# Patient Record
Sex: Male | Born: 1955 | Race: White | Hispanic: No | Marital: Married | Smoking: Never smoker
Health system: Southern US, Community
[De-identification: ages and names within clinical notes are randomized; demographics above are authoritative.]

## PROBLEM LIST (undated history)

## (undated) DIAGNOSIS — Z9884 Bariatric surgery status: Secondary | ICD-10-CM

## (undated) DIAGNOSIS — K219 Gastro-esophageal reflux disease without esophagitis: Secondary | ICD-10-CM

## (undated) DIAGNOSIS — C801 Malignant (primary) neoplasm, unspecified: Secondary | ICD-10-CM

## (undated) DIAGNOSIS — G9389 Other specified disorders of brain: Secondary | ICD-10-CM

## (undated) DIAGNOSIS — E119 Type 2 diabetes mellitus without complications: Secondary | ICD-10-CM

## (undated) DIAGNOSIS — R569 Unspecified convulsions: Secondary | ICD-10-CM

## (undated) HISTORY — DX: Malignant (primary) neoplasm, unspecified: C80.1

## (undated) HISTORY — PX: APPENDECTOMY: SHX54

## (undated) HISTORY — PX: CHOLECYSTECTOMY: SHX55

## (undated) HISTORY — PX: OTHER SURGICAL HISTORY: SHX169

---

## 2009-12-23 HISTORY — PX: GASTRIC BYPASS: SHX52

## 2014-12-23 HISTORY — PX: IVC FILTER PLACEMENT (ARMC HX): HXRAD1551

## 2015-03-23 ENCOUNTER — Emergency Department (HOSPITAL_COMMUNITY): Payer: Commercial Managed Care - PPO

## 2015-03-23 ENCOUNTER — Encounter (HOSPITAL_COMMUNITY): Payer: Self-pay | Admitting: Neurology

## 2015-03-23 ENCOUNTER — Encounter (HOSPITAL_COMMUNITY)
Admission: EM | Disposition: A | Discharge status: Home / Self Care | Payer: Self-pay | Source: Home / Self Care | Attending: Internal Medicine

## 2015-03-23 ENCOUNTER — Inpatient Hospital Stay (HOSPITAL_COMMUNITY)
Admission: EM | Admit: 2015-03-23 | Discharge: 2015-03-28 | DRG: 054 | Disposition: A | Discharge status: Home / Self Care | Payer: Commercial Managed Care - PPO | Attending: Internal Medicine | Admitting: Internal Medicine

## 2015-03-23 DIAGNOSIS — R160 Hepatomegaly, not elsewhere classified: Secondary | ICD-10-CM | POA: Insufficient documentation

## 2015-03-23 DIAGNOSIS — R6881 Early satiety: Secondary | ICD-10-CM | POA: Diagnosis present

## 2015-03-23 DIAGNOSIS — C787 Secondary malignant neoplasm of liver and intrahepatic bile duct: Secondary | ICD-10-CM | POA: Diagnosis present

## 2015-03-23 DIAGNOSIS — Z6833 Body mass index (BMI) 33.0-33.9, adult: Secondary | ICD-10-CM | POA: Diagnosis not present

## 2015-03-23 DIAGNOSIS — M25462 Effusion, left knee: Secondary | ICD-10-CM | POA: Diagnosis present

## 2015-03-23 DIAGNOSIS — I618 Other nontraumatic intracerebral hemorrhage: Secondary | ICD-10-CM | POA: Diagnosis present

## 2015-03-23 DIAGNOSIS — T380X5A Adverse effect of glucocorticoids and synthetic analogues, initial encounter: Secondary | ICD-10-CM | POA: Diagnosis present

## 2015-03-23 DIAGNOSIS — F341 Dysthymic disorder: Secondary | ICD-10-CM | POA: Diagnosis present

## 2015-03-23 DIAGNOSIS — Z9884 Bariatric surgery status: Secondary | ICD-10-CM

## 2015-03-23 DIAGNOSIS — C801 Malignant (primary) neoplasm, unspecified: Secondary | ICD-10-CM | POA: Diagnosis present

## 2015-03-23 DIAGNOSIS — G4733 Obstructive sleep apnea (adult) (pediatric): Secondary | ICD-10-CM | POA: Diagnosis present

## 2015-03-23 DIAGNOSIS — Z7982 Long term (current) use of aspirin: Secondary | ICD-10-CM

## 2015-03-23 DIAGNOSIS — E109 Type 1 diabetes mellitus without complications: Secondary | ICD-10-CM | POA: Diagnosis not present

## 2015-03-23 DIAGNOSIS — E669 Obesity, unspecified: Secondary | ICD-10-CM | POA: Diagnosis present

## 2015-03-23 DIAGNOSIS — G9389 Other specified disorders of brain: Secondary | ICD-10-CM

## 2015-03-23 DIAGNOSIS — G936 Cerebral edema: Secondary | ICD-10-CM | POA: Diagnosis present

## 2015-03-23 DIAGNOSIS — I469 Cardiac arrest, cause unspecified: Secondary | ICD-10-CM | POA: Insufficient documentation

## 2015-03-23 DIAGNOSIS — R55 Syncope and collapse: Secondary | ICD-10-CM | POA: Diagnosis present

## 2015-03-23 DIAGNOSIS — E1165 Type 2 diabetes mellitus with hyperglycemia: Secondary | ICD-10-CM | POA: Diagnosis present

## 2015-03-23 DIAGNOSIS — K769 Liver disease, unspecified: Secondary | ICD-10-CM

## 2015-03-23 DIAGNOSIS — E1169 Type 2 diabetes mellitus with other specified complication: Secondary | ICD-10-CM

## 2015-03-23 DIAGNOSIS — R569 Unspecified convulsions: Secondary | ICD-10-CM

## 2015-03-23 DIAGNOSIS — E119 Type 2 diabetes mellitus without complications: Secondary | ICD-10-CM

## 2015-03-23 DIAGNOSIS — S2243XA Multiple fractures of ribs, bilateral, initial encounter for closed fracture: Secondary | ICD-10-CM | POA: Diagnosis present

## 2015-03-23 DIAGNOSIS — G939 Disorder of brain, unspecified: Secondary | ICD-10-CM | POA: Diagnosis not present

## 2015-03-23 DIAGNOSIS — C7931 Secondary malignant neoplasm of brain: Principal | ICD-10-CM | POA: Diagnosis present

## 2015-03-23 DIAGNOSIS — J690 Pneumonitis due to inhalation of food and vomit: Secondary | ICD-10-CM | POA: Diagnosis present

## 2015-03-23 DIAGNOSIS — I10 Essential (primary) hypertension: Secondary | ICD-10-CM | POA: Diagnosis present

## 2015-03-23 DIAGNOSIS — I5032 Chronic diastolic (congestive) heart failure: Secondary | ICD-10-CM | POA: Diagnosis present

## 2015-03-23 DIAGNOSIS — M25569 Pain in unspecified knee: Secondary | ICD-10-CM

## 2015-03-23 DIAGNOSIS — C7802 Secondary malignant neoplasm of left lung: Secondary | ICD-10-CM | POA: Diagnosis present

## 2015-03-23 DIAGNOSIS — Z79899 Other long term (current) drug therapy: Secondary | ICD-10-CM | POA: Diagnosis not present

## 2015-03-23 DIAGNOSIS — S2249XA Multiple fractures of ribs, unspecified side, initial encounter for closed fracture: Secondary | ICD-10-CM | POA: Diagnosis present

## 2015-03-23 HISTORY — DX: Bariatric surgery status: Z98.84

## 2015-03-23 HISTORY — DX: Type 2 diabetes mellitus without complications: E11.9

## 2015-03-23 HISTORY — DX: Unspecified convulsions: R56.9

## 2015-03-23 HISTORY — PX: LEFT HEART CATHETERIZATION WITH CORONARY ANGIOGRAM: SHX5451

## 2015-03-23 HISTORY — DX: Gastro-esophageal reflux disease without esophagitis: K21.9

## 2015-03-23 LAB — I-STAT TROPONIN, ED: TROPONIN I, POC: 0.01 ng/mL (ref 0.00–0.08)

## 2015-03-23 LAB — I-STAT CHEM 8, ED
BUN: 20 mg/dL (ref 6–23)
CALCIUM ION: 1.08 mmol/L — AB (ref 1.13–1.30)
CHLORIDE: 102 mmol/L (ref 96–112)
Creatinine, Ser: 1.3 mg/dL (ref 0.50–1.35)
GLUCOSE: 183 mg/dL — AB (ref 70–99)
HEMATOCRIT: 51 % (ref 39.0–52.0)
HEMOGLOBIN: 17.3 g/dL — AB (ref 13.0–17.0)
Potassium: 4.6 mmol/L (ref 3.5–5.1)
SODIUM: 138 mmol/L (ref 135–145)
TCO2: 19 mmol/L (ref 0–100)

## 2015-03-23 LAB — BASIC METABOLIC PANEL
Anion gap: 18 — ABNORMAL HIGH (ref 5–15)
BUN: 14 mg/dL (ref 6–23)
CO2: 20 mmol/L (ref 19–32)
CREATININE: 1.27 mg/dL (ref 0.50–1.35)
Calcium: 9.1 mg/dL (ref 8.4–10.5)
Chloride: 101 mmol/L (ref 96–112)
GFR calc Af Amer: 69 mL/min — ABNORMAL LOW (ref >90)
GFR calc non Af Amer: 60 mL/min — ABNORMAL LOW (ref >90)
GLUCOSE: 181 mg/dL — AB (ref 70–99)
Potassium: 4.7 mmol/L (ref 3.5–5.1)
SODIUM: 139 mmol/L (ref 135–145)

## 2015-03-23 LAB — CBC
HCT: 46.2 % (ref 39.0–52.0)
HCT: 36 % — ABNORMAL LOW (ref 39.0–52.0)
HEMOGLOBIN: 12 g/dL — AB (ref 13.0–17.0)
Hemoglobin: 15.5 g/dL (ref 13.0–17.0)
MCH: 30.3 pg (ref 26.0–34.0)
MCH: 29.7 pg (ref 26.0–34.0)
MCHC: 33.5 g/dL (ref 30.0–36.0)
MCHC: 33.3 g/dL (ref 30.0–36.0)
MCV: 90.4 fL (ref 78.0–100.0)
MCV: 89.1 fL (ref 78.0–100.0)
PLATELETS: 221 10*3/uL (ref 150–400)
Platelets: 187 10*3/uL (ref 150–400)
RBC: 5.11 MIL/uL (ref 4.22–5.81)
RBC: 4.04 MIL/uL — AB (ref 4.22–5.81)
RDW: 13.4 % (ref 11.5–15.5)
RDW: 13.3 % (ref 11.5–15.5)
WBC: 10.1 10*3/uL (ref 4.0–10.5)
WBC: 8.5 10*3/uL (ref 4.0–10.5)

## 2015-03-23 LAB — TSH: TSH: 3.505 u[IU]/mL (ref 0.350–4.500)

## 2015-03-23 LAB — CBG MONITORING, ED: GLUCOSE-CAPILLARY: 153 mg/dL — AB (ref 70–99)

## 2015-03-23 LAB — GLUCOSE, CAPILLARY
Glucose-Capillary: 155 mg/dL — ABNORMAL HIGH (ref 70–99)
Glucose-Capillary: 194 mg/dL — ABNORMAL HIGH (ref 70–99)

## 2015-03-23 LAB — CREATININE, SERUM
CREATININE: 0.91 mg/dL (ref 0.50–1.35)
GFR calc Af Amer: >90 mL/min (ref >90)
GFR calc non Af Amer: >90 mL/min (ref >90)

## 2015-03-23 LAB — PROTIME-INR
INR: 1.16 (ref 0.00–1.49)
Prothrombin Time: 14.9 seconds (ref 11.6–15.2)

## 2015-03-23 LAB — MRSA PCR SCREENING: MRSA by PCR: NEGATIVE

## 2015-03-23 LAB — D-DIMER, QUANTITATIVE (NOT AT ARMC): D DIMER QUANT: 3.99 ug{FEU}/mL — AB (ref 0.00–0.48)

## 2015-03-23 LAB — BRAIN NATRIURETIC PEPTIDE: B Natriuretic Peptide: 64.5 pg/mL (ref 0.0–100.0)

## 2015-03-23 LAB — MAGNESIUM: Magnesium: 1.7 mg/dL (ref 1.5–2.5)

## 2015-03-23 SURGERY — LEFT HEART CATHETERIZATION WITH CORONARY ANGIOGRAM
Anesthesia: LOCAL

## 2015-03-23 MED ORDER — ONDANSETRON HCL 4 MG/2ML IJ SOLN: 4.0000 mg | Freq: Four times a day (QID) | INTRAMUSCULAR | Status: DC | PRN

## 2015-03-23 MED ORDER — ASPIRIN 300 MG RE SUPP: 300.0000 mg | RECTAL | Status: AC

## 2015-03-23 MED ORDER — LIDOCAINE HCL (PF) 1 % IJ SOLN
INTRAMUSCULAR | Status: AC
  Filled 2015-03-23: qty 30

## 2015-03-23 MED ORDER — SODIUM CHLORIDE 0.9 % IJ SOLN: 3.0000 mL | INTRAMUSCULAR | Status: DC | PRN

## 2015-03-23 MED ORDER — HEPARIN SODIUM (PORCINE) 1000 UNIT/ML IJ SOLN
INTRAMUSCULAR | Status: AC
  Filled 2015-03-23: qty 1

## 2015-03-23 MED ORDER — OXYCODONE-ACETAMINOPHEN 5-325 MG PO TABS
1.0000 | ORAL_TABLET | ORAL | Status: DC | PRN
  Administered 2015-03-23 (×2): 2 via ORAL
  Administered 2015-03-24 – 2015-03-26 (×7): 1 via ORAL
  Administered 2015-03-27 – 2015-03-28 (×3): 2 via ORAL
  Filled 2015-03-23: qty 2
  Filled 2015-03-23: qty 1
  Filled 2015-03-23 (×4): qty 2
  Filled 2015-03-23 (×2): qty 1
  Filled 2015-03-23: qty 2
  Filled 2015-03-23 (×3): qty 1

## 2015-03-23 MED ORDER — AMIODARONE IV BOLUS ONLY 150 MG/100ML
150.0000 mg | Freq: Once | INTRAVENOUS | Status: AC
  Administered 2015-03-23: 150 mg via INTRAVENOUS

## 2015-03-23 MED ORDER — SODIUM CHLORIDE 0.9 % IV SOLN: INTRAVENOUS | Status: AC

## 2015-03-23 MED ORDER — AMIODARONE HCL IN DEXTROSE 360-4.14 MG/200ML-% IV SOLN
30.0000 mg/h | INTRAVENOUS | Status: DC
  Administered 2015-03-24: 30 mg/h via INTRAVENOUS
  Filled 2015-03-23 (×4): qty 200

## 2015-03-23 MED ORDER — ASPIRIN EC 81 MG PO TBEC
81.0000 mg | DELAYED_RELEASE_TABLET | Freq: Every day | ORAL | Status: DC
  Administered 2015-03-24: 81 mg via ORAL
  Filled 2015-03-23: qty 1

## 2015-03-23 MED ORDER — NITROGLYCERIN 0.4 MG SL SUBL: 0.4000 mg | SUBLINGUAL_TABLET | SUBLINGUAL | Status: DC | PRN

## 2015-03-23 MED ORDER — ACETAMINOPHEN 325 MG PO TABS: 650.0000 mg | ORAL_TABLET | ORAL | Status: DC | PRN

## 2015-03-23 MED ORDER — FENTANYL CITRATE 0.05 MG/ML IJ SOLN
INTRAMUSCULAR | Status: AC
  Filled 2015-03-23: qty 2

## 2015-03-23 MED ORDER — MORPHINE SULFATE 2 MG/ML IJ SOLN: 2.0000 mg | INTRAMUSCULAR | Status: DC | PRN

## 2015-03-23 MED ORDER — SODIUM CHLORIDE 0.9 % IV SOLN: 250.0000 mL | INTRAVENOUS | Status: DC | PRN

## 2015-03-23 MED ORDER — ASPIRIN 81 MG PO CHEW: 324.0000 mg | CHEWABLE_TABLET | ORAL | Status: AC

## 2015-03-23 MED ORDER — VERAPAMIL HCL 2.5 MG/ML IV SOLN
INTRAVENOUS | Status: AC
  Filled 2015-03-23: qty 2

## 2015-03-23 MED ORDER — MIDAZOLAM HCL 2 MG/2ML IJ SOLN
INTRAMUSCULAR | Status: AC
  Filled 2015-03-23: qty 2

## 2015-03-23 MED ORDER — AMIODARONE HCL IN DEXTROSE 360-4.14 MG/200ML-% IV SOLN
60.0000 mg/h | Freq: Once | INTRAVENOUS | Status: AC
  Administered 2015-03-23: 60 mg/h via INTRAVENOUS
  Filled 2015-03-23: qty 200

## 2015-03-23 MED ORDER — INSULIN ASPART 100 UNIT/ML ~~LOC~~ SOLN
0.0000 [IU] | Freq: Three times a day (TID) | SUBCUTANEOUS | Status: DC
  Administered 2015-03-24: 1 [IU] via SUBCUTANEOUS
  Administered 2015-03-24: 2 [IU] via SUBCUTANEOUS
  Administered 2015-03-25 (×2): 1 [IU] via SUBCUTANEOUS
  Administered 2015-03-25: 3 [IU] via SUBCUTANEOUS

## 2015-03-23 MED ORDER — AMIODARONE HCL IN DEXTROSE 360-4.14 MG/200ML-% IV SOLN
60.0000 mg/h | INTRAVENOUS | Status: AC
  Administered 2015-03-23: 60 mg/h via INTRAVENOUS
  Filled 2015-03-23: qty 200

## 2015-03-23 MED ORDER — SODIUM CHLORIDE 0.9 % IJ SOLN
3.0000 mL | Freq: Two times a day (BID) | INTRAMUSCULAR | Status: DC
  Administered 2015-03-23 – 2015-03-25 (×4): 3 mL via INTRAVENOUS

## 2015-03-23 MED ORDER — HEPARIN (PORCINE) IN NACL 2-0.9 UNIT/ML-% IJ SOLN
INTRAMUSCULAR | Status: AC
  Filled 2015-03-23: qty 1000

## 2015-03-23 NOTE — CV Procedure (Addendum)
      Cardiac Catheterization Operative Report  Walter Wilkins [id number] 3/31/20163:47 PM No primary care provider on file.  Procedure Performed:  1. Left Heart Catheterization 2. Selective Coronary Angiography 3. Left ventricular angiogram  Operator: Lauree Chandler, MD  Arterial access site:  Right radial artery.   Indication:  59 yo male with history of DM brought to the ED by EMS after cardiac arrest at the [facility] game. He stood up to walk to the bathroom and passed out. Bystander CPR and 2 shocks delivered by the AED but rhythm unknown. Pt was quickly responsive and was not intubated. No chest pain or SOB. EKG without ischemic changes. Given cardiac arrest we elected to perform cardiac cath to exclude obstructive CAD.                                      Procedure Details: The risks, benefits, complications, treatment options, and expected outcomes were discussed with the patient. The patient and/or family concurred with the proposed plan. Emergency consent placed on the chart. The patient was brought to the cath lab from the ED. The patient was further sedated with Versed and Fentanyl. The right wrist was assessed with a modified Allens test which was positive. The right wrist was prepped and draped in a sterile fashion. 1% lidocaine was used for local anesthesia. Using the modified Seldinger access technique, a 5 French sheath was placed in the right radial artery. 3 mg Verapamil was given through the sheath. 5000 units IV heparin was given. Standard diagnostic catheters were used to perform selective coronary angiography. A pigtail catheter was used to perform a left ventricular angiogram. The sheath was removed from the right radial artery and a Terumo hemostasis band was applied at the arteriotomy site on the right wrist.   There were no immediate complications. The patient was taken to the recovery area in stable condition.   Hemodynamic Findings: Central aortic  pressure: 105/69 Left ventricular pressure: 111/4/6  Angiographic Findings:  Left main: No obstructive disease.   Left Anterior Descending Artery: Large caliber vessel that courses to the apex. Large caliber first diagonal branch and moderate caliber second diagonal branch. No obstructive disease.   Circumflex Artery: Large caliber vessel with small first obtuse marginal branch and moderate caliber second obtuse marginal branch. No obstructive disease.   Right Coronary Artery: Very large caliber, dominant vessel with no obstructive disease.   Left Ventricular Angiogram: LVEF=55-60%.   Impression: 1. No angiographic evidence of CAD 2. Out of hospital cardiac arrest with normal mentation, normal blood pressure 3. Normal LV systolic function  Recommendations: Will admit to CCU. Echo later today. Follow closely on telemetry. Cycle cardiac markers. Will attempt to get print out of strips from the AED. Our STEMI coordinator is working on this. Will ask EP to see in the am. May need Lifevest at time of discharge as this appears to be a primary arrhythmic event. Continue amiodarone drip for now.        Complications:  None. The patient tolerated the procedure well.

## 2015-03-23 NOTE — ED Notes (Signed)
Per EMS- Pt was at [employer], got up to go to the bathroom and collapsed. Was assisted to the floor. Bystanders started CPR for 3 mins, AED shocked x 1. When EMS arrived OPA in place, was being bagged with strong pulses. While in route pt started verbally communicating with EMS. Pt does not rememeber what happened, denies feeling bad prior.

## 2015-03-23 NOTE — Progress Notes (Signed)
Patient arrived to floor approximately 1600 via bed from cath lab, report given at bedside, TR band in place to right radial with 12 cc air, began deflating at 1615 based on time from cath lab.  He has been alert and oriented since arriving, appears to be tired, has no recollection of what happened immediately prior to going "down".  Wife and/or brother have been at bedside most of the afternoon.  He complains of sternal chest pain due to compressions, medicating with PO pain medication once available. He was able to eat saltines and ginger ale before advancing diet without difficulty or nausea.  Family updated on SDU policy and procedure, will continue to monitor

## 2015-03-23 NOTE — ED Notes (Signed)
Cath lab ready for patient. Family at bedside, belongings given to wife.

## 2015-03-23 NOTE — ED Notes (Signed)
Cardiology at bedside, plan for pt cath.

## 2015-03-23 NOTE — H&P (Signed)
Patient ID: Walter Wilkins MRN: [id number], DOB/AGE: 02/19/1956   Admit date: 03/23/2015   Primary Physician: No primary care provider on file. Primary Cardiologist: New  Pt. Profile:  Walter Wilkins is a 59 y.o. male with a history of DM, gastric surgery and no prior cardiac history who presented to [facility] via EMS after a syncopal event at a baseball game. History is limited but CPR was provided and a civilian AED was used and he was shocked twice. Cardiology was called for urgent evaluation.  He was in his usual state of health at a baseball game with his grandchildren. He got up to walk to the bathroom when he suddenly passed out. The next thing he remembers is waking up at [facility]. He is now alert and oriented. He does not recall any palpitations, chest pain or SOB. He currently has some tenderness in his chest wall where CPR was provided and defibrillation pads were placed. He did bite his tonge and became incontinent of urine during this event. The AED had no monitor and we have no recordings of strips. They are presumed vfib/vtach as a shockable rhythm but we cannot be certain. In the ED ECG with sinus tachycardia and no evidence of ischemic changes. Stat BMET with creat 1.3.  He has no past cardiac history, HTN, HLD, CKD, previous CVA.    Problem List  Past Medical History  Diagnosis Date  . Diabetes mellitus without complication     History reviewed. No pertinent past surgical history.   Allergies  No Known Allergies   Home Medications  Prior to Admission medications   Not on File    Family History  No family history on file. No family status information on file.     Social History  History   Social History  . Marital Status: Unknown    Spouse Name: N/A  . Number of Children: N/A  . Years of Education: N/A   Occupational History  . Not on file.   Social History Main Topics  . Smoking status: Not on file  . Smokeless tobacco: Not on file  . Alcohol Use: Not  on file  . Drug Use: Not on file  . Sexual Activity: Not on file   Other Topics Concern  . Not on file   Social History Narrative  . No narrative on file     All other systems reviewed and are otherwise negative except as noted above.  Physical Exam  Blood pressure 142/104, pulse 103, temperature 99.4 F (37.4 C), temperature source Oral, resp. rate 14, SpO2 100 %.  General: Pleasant, NAD. Blood on face from tongue bite Psych: Normal affect. Neuro: Alert and oriented X 3. Moves all extremities spontaneously. HEENT: Normal  Neck: Supple without bruits or JVD. Lungs:  Resp regular and unlabored, CTA. Heart: RRR no s3, s4, or murmurs. Tenderness in chest wall.  Abdomen: Soft, non-tender, non-distended, BS + x 4.  Extremities: No clubbing, cyanosis or edema. DP/PT/Radials 2+ and equal bilaterally.  Labs   Radiology/Studies  No results found.  ECG  Sinus tachycardia HR 114  ASSESSMENT AND PLAN   Walter Wilkins is a 59 y.o. male with a history of DM, gastric surgery and no prior cardiac history who presented to [facility] via EMS after a syncopal event at a baseball game. History is limited but CPR was provided and a civilian AED was used and he was shocked twice. Cardiology was called for urgent evaluation.  Syncopal episode- presumed cardiac arrest  although we have no objective evidence of this. Continue IV amio. Will take to cardiac cath lab for urgent cardiac catheterixation to exclude ischemia  -- Cycle troponin x3, serial ECGs -- Will admit to step down -- 2D ECHO    Signed, Eileen Stanford, PA-C 03/23/2015, 2:44 PM  Pager [phone]  I have personally seen and examined this patient with Angelena Form, PA-C. I agree with the assessment and plan as outlined above. He is a known diabetic but has no known CAD. He was at the [employer] today and when he got up to go to the bathroom he passed out. No chest pain or SOB. He was feeling well. No palpitations. He received  bystander CPR and AED delivered 2 shocks for unknown rhythm. He woke up and was transported to [facility] ED for further evaluation. EKG does not show injury current. He has no chest pain or SOB. I have discussed the possible etiology of cardiac arrest which could be primary arrhythmia vs acute coronary syndrome. Will plan cardiac cath to exclude obstructive CAD. Will plan echo later today. Admit to CCU following cath.   Walter Wilkins 03/23/2015 3:11 PM

## 2015-03-23 NOTE — ED Provider Notes (Addendum)
CSN: [id number]     Arrival date & time 03/23/15  1420 History   First MD Initiated Contact with Patient 03/23/15 1419     Chief Complaint  Patient presents with  . Loss of Consciousness     (Consider location/radiation/quality/duration/timing/severity/associated sxs/prior Treatment) Patient is a 59 y.o. male presenting with syncope. The history is provided by the patient.  Loss of Consciousness Episode history:  Single Most recent episode:  Today Duration:  5 minutes Timing:  Constant Progression:  Resolved Chronicity:  New Witnessed: yes   Relieved by: [place] ballpark. Ineffective treatments:  None tried Associated symptoms: no chest pain, no fever, no nausea, no shortness of breath and no vomiting     Past Medical History  Diagnosis Date  . Diabetes mellitus without complication    History reviewed. No pertinent past surgical history. No family history on file. History  Substance Use Topics  . Smoking status: Not on file  . Smokeless tobacco: Not on file  . Alcohol Use: Not on file    Review of Systems  Constitutional: Negative for fever and chills.  Respiratory: Negative for cough and shortness of breath.   Cardiovascular: Positive for syncope. Negative for chest pain and leg swelling.  Gastrointestinal: Negative for nausea, vomiting and abdominal pain.  All other systems reviewed and are negative.     Allergies  Review of patient's allergies indicates no known allergies.  Home Medications   Prior to Admission medications   Not on File   BP 142/104 mmHg  Pulse 103  Temp(Src) 99.4 F (37.4 C) (Oral)  Resp 14  SpO2 100% Physical Exam  Constitutional: He is oriented to person, place, and time. He appears well-developed and well-nourished. No distress.  HENT:  Head: Normocephalic and atraumatic.  Mouth/Throat: Oropharynx is clear and moist. No oropharyngeal exudate.  Eyes: EOM are normal. Pupils are equal, round, and reactive to light.  Neck:  Normal range of motion. Neck supple.  Cardiovascular: Normal rate and regular rhythm.  Exam reveals no friction rub.   No murmur heard. Pulmonary/Chest: Effort normal and breath sounds normal. No respiratory distress. He has no wheezes. He has no rales.  Abdominal: Soft. He exhibits no distension. There is no tenderness. There is no rebound.  Musculoskeletal: Normal range of motion. He exhibits no edema.  Neurological: He is alert and oriented to person, place, and time. No cranial nerve deficit. He exhibits normal muscle tone. Coordination normal.  Skin: Skin is warm. No rash noted. He is not diaphoretic.  Nursing note and vitals reviewed.   ED Course  Procedures (including critical care time) Labs Review Labs Reviewed  BASIC METABOLIC PANEL - Abnormal; Notable for the following:    Glucose, Bld 181 (*)    GFR calc non Af Amer 60 (*)    GFR calc Af Amer 69 (*)    Anion gap 18 (*)    All other components within normal limits  I-STAT CHEM 8, ED - Abnormal; Notable for the following:    Glucose, Bld 183 (*)    Calcium, Ion 1.08 (*)    Hemoglobin 17.3 (*)    All other components within normal limits  CBG MONITORING, ED - Abnormal; Notable for the following:    Glucose-Capillary 153 (*)    All other components within normal limits  MRSA PCR SCREENING  CBC  I-STAT TROPOININ, ED    Imaging Review Dg Chest Portable 1 View  03/23/2015   CLINICAL DATA:  Syncope at baseball  game with CPR.  EXAM: PORTABLE CHEST - 1 VIEW  COMPARISON:  None.  FINDINGS: There is airspace opacity throughout the left lung. Heart size and upper mediastinal contours are within normal limits for technique. No evidence of effusion or air leak. No evidence of rib fracture post CPR.  IMPRESSION: Airspace disease throughout the left lung, likely aspiration given the history. CPR related lung contusion considered unlikely given no visible rib fracture.   Electronically Signed   By: Monte Fantasia M.D.   On: 03/23/2015  15:03     EKG Interpretation None      <ECG> EKG: sinus tachycardia, rate 118. No prior for comparison. No ischemic changes.  CRITICAL CARE Performed by: Osvaldo Shipper   Total critical care time: 30 minutes  Critical care time was exclusive of separately billable procedures and treating other patients.  Critical care was necessary to treat or prevent imminent or life-threatening deterioration.  Critical care was time spent personally by me on the following activities: development of treatment plan with patient and/or surrogate as well as nursing, discussions with consultants, evaluation of patient's response to treatment, examination of patient, obtaining history from patient or surrogate, ordering and performing treatments and interventions, ordering and review of laboratory studies, ordering and review of radiographic studies, pulse oximetry and re-evaluation of patient's condition.  MDM   Final diagnoses:  Cardiac arrest    76M here post-CPR. Collapsed at a [employer] baseball game. He received 3 minutes of bystander CPR and was shocked with an AED at the ballpark.  GCS 15 on arrival, never was intubated.  He is amnestic to entire event. Cards consulted immediately. Amiodarone gtt initiated. Dr. Angelena Form with Cards will take to the cath lab. I spoke with the ballpark, who will download info from the AED and send it to the cath lab. The AED doesn't have a print out or a screen to show history. It did say, "shock advised."  Evelina Bucy, MD 03/23/15 1619  Evelina Bucy, MD 03/23/15 [phone]

## 2015-03-24 ENCOUNTER — Inpatient Hospital Stay (HOSPITAL_COMMUNITY): Payer: Commercial Managed Care - PPO

## 2015-03-24 ENCOUNTER — Encounter (HOSPITAL_COMMUNITY): Payer: Self-pay | Admitting: Physician Assistant

## 2015-03-24 DIAGNOSIS — Z9884 Bariatric surgery status: Secondary | ICD-10-CM

## 2015-03-24 DIAGNOSIS — G939 Disorder of brain, unspecified: Secondary | ICD-10-CM

## 2015-03-24 DIAGNOSIS — E1169 Type 2 diabetes mellitus with other specified complication: Secondary | ICD-10-CM

## 2015-03-24 DIAGNOSIS — R569 Unspecified convulsions: Secondary | ICD-10-CM | POA: Insufficient documentation

## 2015-03-24 DIAGNOSIS — S2249XA Multiple fractures of ribs, unspecified side, initial encounter for closed fracture: Secondary | ICD-10-CM | POA: Diagnosis present

## 2015-03-24 DIAGNOSIS — K7689 Other specified diseases of liver: Secondary | ICD-10-CM

## 2015-03-24 DIAGNOSIS — I5032 Chronic diastolic (congestive) heart failure: Secondary | ICD-10-CM | POA: Diagnosis present

## 2015-03-24 DIAGNOSIS — J690 Pneumonitis due to inhalation of food and vomit: Secondary | ICD-10-CM | POA: Diagnosis present

## 2015-03-24 DIAGNOSIS — E119 Type 2 diabetes mellitus without complications: Secondary | ICD-10-CM

## 2015-03-24 DIAGNOSIS — I10 Essential (primary) hypertension: Secondary | ICD-10-CM | POA: Diagnosis present

## 2015-03-24 DIAGNOSIS — E669 Obesity, unspecified: Secondary | ICD-10-CM

## 2015-03-24 DIAGNOSIS — R55 Syncope and collapse: Secondary | ICD-10-CM

## 2015-03-24 DIAGNOSIS — I469 Cardiac arrest, cause unspecified: Secondary | ICD-10-CM

## 2015-03-24 DIAGNOSIS — G9389 Other specified disorders of brain: Secondary | ICD-10-CM

## 2015-03-24 HISTORY — DX: Other specified disorders of brain: G93.89

## 2015-03-24 LAB — CBC
HCT: 42 % (ref 39.0–52.0)
Hemoglobin: 13.9 g/dL (ref 13.0–17.0)
MCH: 29.6 pg (ref 26.0–34.0)
MCHC: 33.1 g/dL (ref 30.0–36.0)
MCV: 89.4 fL (ref 78.0–100.0)
Platelets: 190 10*3/uL (ref 150–400)
RBC: 4.7 MIL/uL (ref 4.22–5.81)
RDW: 13.3 % (ref 11.5–15.5)
WBC: 9 10*3/uL (ref 4.0–10.5)

## 2015-03-24 LAB — LIPID PANEL
CHOL/HDL RATIO: 3.2 ratio
Cholesterol: 136 mg/dL (ref 0–200)
HDL: 42 mg/dL (ref >39)
LDL CALC: 68 mg/dL (ref 0–99)
TRIGLYCERIDES: 129 mg/dL (ref <150)
VLDL: 26 mg/dL (ref 0–40)

## 2015-03-24 LAB — GLUCOSE, CAPILLARY
GLUCOSE-CAPILLARY: 155 mg/dL — AB (ref 70–99)
GLUCOSE-CAPILLARY: 251 mg/dL — AB (ref 70–99)
Glucose-Capillary: 128 mg/dL — ABNORMAL HIGH (ref 70–99)
Glucose-Capillary: 146 mg/dL — ABNORMAL HIGH (ref 70–99)

## 2015-03-24 LAB — COMPREHENSIVE METABOLIC PANEL
ALT: 22 U/L (ref 0–53)
AST: 28 U/L (ref 0–37)
Albumin: 3.1 g/dL — ABNORMAL LOW (ref 3.5–5.2)
Alkaline Phosphatase: 135 U/L — ABNORMAL HIGH (ref 39–117)
Anion gap: 5 (ref 5–15)
BILIRUBIN TOTAL: 0.5 mg/dL (ref 0.3–1.2)
BUN: 13 mg/dL (ref 6–23)
CHLORIDE: 103 mmol/L (ref 96–112)
CO2: 29 mmol/L (ref 19–32)
Calcium: 8.5 mg/dL (ref 8.4–10.5)
Creatinine, Ser: 0.93 mg/dL (ref 0.50–1.35)
GFR calc Af Amer: >90 mL/min (ref >90)
GFR, EST NON AFRICAN AMERICAN: 89 mL/min — AB (ref >90)
Glucose, Bld: 126 mg/dL — ABNORMAL HIGH (ref 70–99)
Potassium: 4.3 mmol/L (ref 3.5–5.1)
SODIUM: 137 mmol/L (ref 135–145)
Total Protein: 6 g/dL (ref 6.0–8.3)

## 2015-03-24 LAB — PROTIME-INR
INR: 1.04 (ref 0.00–1.49)
Prothrombin Time: 13.7 seconds (ref 11.6–15.2)

## 2015-03-24 MED ORDER — IOHEXOL 350 MG/ML SOLN
80.0000 mL | Freq: Once | INTRAVENOUS | Status: AC | PRN
  Administered 2015-03-24: 80 mL via INTRAVENOUS

## 2015-03-24 MED ORDER — LEVETIRACETAM 500 MG PO TABS
500.0000 mg | ORAL_TABLET | Freq: Two times a day (BID) | ORAL | Status: DC
  Administered 2015-03-24 – 2015-03-28 (×9): 500 mg via ORAL
  Filled 2015-03-24 (×10): qty 1

## 2015-03-24 MED ORDER — PERFLUTREN LIPID MICROSPHERE
INTRAVENOUS | Status: AC
  Filled 2015-03-24: qty 10

## 2015-03-24 MED ORDER — DEXAMETHASONE SODIUM PHOSPHATE 4 MG/ML IJ SOLN
4.0000 mg | Freq: Four times a day (QID) | INTRAMUSCULAR | Status: DC
  Administered 2015-03-24 – 2015-03-28 (×16): 4 mg via INTRAVENOUS
  Filled 2015-03-24 (×19): qty 1

## 2015-03-24 MED ORDER — ATORVASTATIN CALCIUM 40 MG PO TABS
40.0000 mg | ORAL_TABLET | Freq: Every day | ORAL | Status: DC
  Administered 2015-03-24 – 2015-03-27 (×4): 40 mg via ORAL
  Filled 2015-03-24 (×5): qty 1

## 2015-03-24 MED ORDER — SERTRALINE HCL 100 MG PO TABS
100.0000 mg | ORAL_TABLET | Freq: Two times a day (BID) | ORAL | Status: DC
  Administered 2015-03-24 – 2015-03-28 (×8): 100 mg via ORAL
  Filled 2015-03-24: qty 1
  Filled 2015-03-24: qty 2
  Filled 2015-03-24 (×7): qty 1

## 2015-03-24 MED ORDER — PERFLUTREN LIPID MICROSPHERE
1.0000 mL | INTRAVENOUS | Status: AC | PRN
  Administered 2015-03-24: 10 mL via INTRAVENOUS
  Filled 2015-03-24: qty 10

## 2015-03-24 MED ORDER — INSULIN ASPART 100 UNIT/ML ~~LOC~~ SOLN
3.0000 [IU] | Freq: Once | SUBCUTANEOUS | Status: AC
  Administered 2015-03-24: 3 [IU] via SUBCUTANEOUS

## 2015-03-24 MED ORDER — ALPRAZOLAM 0.25 MG PO TABS
0.2500 mg | ORAL_TABLET | Freq: Two times a day (BID) | ORAL | Status: DC | PRN
Start: 2015-03-24 — End: 2015-03-28
  Administered 2015-03-24: 0.25 mg via ORAL
  Filled 2015-03-24: qty 1

## 2015-03-24 NOTE — Progress Notes (Signed)
Utilization review completed. Mayson Mcneish, RN, BSN. 

## 2015-03-24 NOTE — Progress Notes (Addendum)
CT negative for PE but did show multiple low-density lesions in the liver worrisome for metastatic disease. Single enlarged left hilar lymph node. Multiple rib fx most likely secondary to CPR. Consolidative infiltrate in the left lower lobe with interstitial infiltrate in the left upper lobe, subspected aspiration pneumonitis.  I discussed the abnormal liver lesion findings with the patient and his wife. His wife says he had a spot on his lung that has been followed for years with PET scan negative 3 years ago. Will discuss next plan with Dr. Caryl Comes but anticipate will involve IM for further evaluation. CT head pending. L knee film shows small joint effusion - patient will rest knee today and we can reassess in AM to determine if ortho consult needed.  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 Under these circumstances we now believe the event was most likely seizure and will hold off on signal-averaged ECG and cardiac MRI. Appreciate neuro input. Asked them if they wanted to take on their service as primary but they requested internal medicine. Will call IM. Dayna Dunn PA-C

## 2015-03-24 NOTE — Consult Note (Signed)
Electrophysiology Consultation Note  Patient ID: Walter Wilkins, MRN: [id number], DOB/AGE: 03-01-1956 59 y.o. Admit date: 03/23/2015   Date of Consult: 03/24/2015 Primary Physician: No primary care provider on file. Primary Cardiologist: New  Chief Complaint: loss of consciousness Reason for Consultation: possible cardiac arrest  HPI: Walter Wilkins is a 59 y/o M with history of DM, acid reflux, prior gastric bypass 5 years ago at [facility] who presented to [facility] yesterday with cardiac arrest. The story is somewhat convoluted as there were initial reports of defibrillation then not.  He considers himself generally very healthy. He has not had any unusual symptoms at all recently. He was in his usual state of health yesterday at the [facility]. Sequence of events is obtained from EMS report in chart as well as discussion with Claire Shown (STEMI coordinator) who had spoken with the CRNA present at the time of arrest. Apparently the patient stood upright from his chair, grasped the railing and began to display "full blown seizure"-like activity. The patient does recall feeling that his eyes were flickering asymmetrically and that his left mouth drew in. The CRNA and other bystanders quickly assisted the patient to the ground. His face was blue and he did not have a pulse. The patient was apneic, unresponsive, incontinent of urine and eyes had rolled back in his head. CRNA immediately requested an AED. A bystander began CPR but the CRNA did not think the form was adequate so they traded places and she administered CPR. This continued for 3 minutes. AED pads were applied - here is where the story gets tricky. Per initial report, AED advised a shock and shock button was pressed but patient never jumped. The CRNA resumed CPR and noted the patient beginning to breathe on his own. She stopped compressions and felt for a pulse, and he had a very strong one. He began to [facility] and  speak. In retrospect the CRNA says it was very loud around the event and it's not clear if the device actually said "shock advised" or "NO shock advised." Upon EMS arrival, the patient was alert and oriented, with intelligible/coherent speech, moving extremities with purpose. He bit his lateral tongue. Initial vitals recorded by EMS showed BP 150/98, pulse 68->124, RR 18. His extremities were still blue when they arrived. He was placed on supplemental oxygen and transferred to the ER.  The AED was eventually interrogated (strips in chart). There is no evidence that shock was ever advised or administered. Initial strips show narrow complex and irregular beats, with intermitteny wide complex singles, couplets, triplets and one 4-beat run. Shortly after first prompt to resume CPR, there is less organized rhythm but this appears due to CPR. Later strip appears to be subsequent sinus tachycardia. (Will ask MD to review strips). F/u EKG by EMS showed sinus tachycardia with ST depression V4-V6. He underwent cardiac cath 03/23/15 given possible cardiac arrest demonstrating no evidence of CAD, normal EF 55-60%. He has remained hemodynamically stable since the event with only one occurrence yesterday evening of SBP dropping to 89/58. Not hypoxic. He currently feels fine except for some left knee pain where he hit yesterday. He denies any recent CP, dyspnea, palpitations, LEE, orthopnea, history of palpitations, presyncope or syncope. No family history of sudden cardiac death or prior history of events. Labs notable for hyperglycemia compatible with known DM, d-dimer 3.99. He does report recent drive to [place], [place] as well as flight to [place] recently. Tele NSR since event. He  was started on amiodarone and remains on drip at 30mg /hr.  Past Medical History  Diagnosis Date  . Diabetes mellitus without complication   . H/O gastric bypass     a. ~2011 at [facility].  . Acid reflux       Most Recent Cardiac  Studies: Cardiac cath as above.   Surgical History:  Past Surgical History  Procedure Laterality Date  . Left heart catheterization with coronary angiogram N/A 03/23/2015    Procedure: LEFT HEART CATHETERIZATION WITH CORONARY ANGIOGRAM;  Surgeon: Burnell Blanks, MD;  Location: [facility] CATH LAB;  Service: Cardiovascular;  Laterality: N/A;     Home Meds: Prior to Admission medications   Medication Sig Start Date End Date Taking? Authorizing Provider  ALPRAZolam Duanne Moron) 0.25 MG tablet Take 0.25 mg by mouth at bedtime as needed for anxiety.   Yes Historical Provider, MD  aspirin EC 81 MG tablet Take 81 mg by mouth daily.   Yes Historical Provider, MD  metFORMIN (GLUCOPHAGE) 500 MG tablet Take 500 mg by mouth 2 (two) times daily with a meal.   Yes Historical Provider, MD  omeprazole (PRILOSEC) 20 MG capsule Take 20 mg by mouth daily.   Yes Historical Provider, MD  sertraline (ZOLOFT) 100 MG tablet Take 100 mg by mouth daily.   Yes Historical Provider, MD    Inpatient Medications:  . aspirin  324 mg Oral NOW   Or  . aspirin  300 mg Rectal NOW  . aspirin EC  81 mg Oral Daily  . insulin aspart  0-9 Units Subcutaneous TID WC  . sodium chloride  3 mL Intravenous Q12H   . amiodarone 30 mg/hr (03/24/15 0500)    Allergies: No Known Allergies  History   Social History  . Marital Status: Unknown    Spouse Name: N/A  . Number of Children: N/A  . Years of Education: N/A   Occupational History  . Not on file.   Social History Main Topics  . Smoking status: Never Smoker   . Smokeless tobacco: Not on file  . Alcohol Use: 0.0 oz/week    0 Standard drinks or equivalent per week     Comment: Occasionally socially - once a week or less  . Drug Use: No  . Sexual Activity: Not on file   Other Topics Concern  . Not on file   Social History Narrative     Family History  Problem Relation Age of Onset  . Valvular heart disease Father     H/o pig valve  . Sudden death Neg Hx     No  family history of sudden cardiac death     Review of Systems: General: negative for chills, fever, night sweats or weight changes.  Cardiovascular: negative for chest pain, edema, orthopnea, palpitations, paroxysmal nocturnal dyspnea, shortness of breath or dyspnea on exertion Dermatological: negative for rash Respiratory: negative for cough or wheezing Urologic: negative for hematuria Abdominal: negative for nausea, vomiting, diarrhea, bright red blood per rectum, melena, or hematemesis Neurologic: negative for visual changes or dizziness All other systems reviewed and are otherwise negative except as noted above.  Labs:  Lab Results  Component Value Date   WBC 9.0 03/24/2015   HGB 13.9 03/24/2015   HCT 42.0 03/24/2015   MCV 89.4 03/24/2015   PLT 190 03/24/2015    Recent Labs Lab 03/24/15 0318  NA 137  K 4.3  CL 103  CO2 29  BUN 13  CREATININE 0.93  CALCIUM 8.5  PROT 6.0  BILITOT 0.5  ALKPHOS 135*  ALT 22  AST 28  GLUCOSE 126*   Lab Results  Component Value Date   CHOL 136 03/24/2015   HDL 42 03/24/2015   LDLCALC 68 03/24/2015   TRIG 129 03/24/2015   Lab Results  Component Value Date   DDIMER 3.99* 03/23/2015    Radiology/Studies:  Dg Chest Portable 1 View  03/23/2015   CLINICAL DATA:  Syncope at baseball game with CPR.  EXAM: PORTABLE CHEST - 1 VIEW  COMPARISON:  None.  FINDINGS: There is airspace opacity throughout the left lung. Heart size and upper mediastinal contours are within normal limits for technique. No evidence of effusion or air leak. No evidence of rib fracture post CPR.  IMPRESSION: Airspace disease throughout the left lung, likely aspiration given the history. CPR related lung contusion considered unlikely given no visible rib fracture.   Electronically Signed   By: Monte Fantasia M.D.   On: 03/23/2015 15:03    EKG:  Initial tracing sinus tach 131bpm with ST depression in V4-V6 This AM: NSR 65bpm with sinus arrhythmia, QTc 453, Q wave in  III/avF, TWI V3, otherwise no acute changes  Most recent prior EKG from PCP's office 07/2013 showed NSR, chronic Q waves in lead III, avF, generally low voltage, otherwise no acute changes.  Physical Exam: Blood pressure 127/67, pulse 63, temperature 98.2 F (36.8 C), temperature source Oral, resp. rate 18, height 6' (1.829 m), weight 261 lb 0.4 oz (118.4 kg), SpO2 99 %. General: Well developed, well nourished WM, in no acute distress. Head: Normocephalic, atraumatic, sclera non-icteric, no xanthomas, nares are without discharge.  Neck: Negative for carotid bruits. JVD not elevated. Lungs: Clear bilaterally to auscultation without wheezes, rales, or rhonchi. Breathing is unlabored. Heart: RRR with S1 S2. No murmurs, rubs, or gallops appreciated. Abdomen: Soft, non-tender, non-distended with normoactive bowel sounds. No hepatomegaly. No rebound/guarding. No obvious abdominal masses. Msk:  Strength and tone appear normal for age. Extremities: No clubbing or cyanosis. No edema.  Distal pedal pulses are 2+ and equal bilaterally. 2+ pedal pulses bilaterally. Left knee with mild abrasion on the anterior patella. Neuro: Alert and oriented X 3. No facial asymmetry. No focal deficit. Moves all extremities spontaneously. Psych:  Responds to questions appropriately with a normal affect.   Assessment and Plan:   1. Syncope with possible cardiac arrest versus seizure 2. Initial irregular narrow complex rhythm with intermittent wide complex beats (up to one 4-beat run) 3. Normal coronary arteries and normal LV function 4. Elevated d-dimer 5. Diabetes mellitus 6. Possible airspace disease on CXR 03/23/15 but not manifesting any s/sx 7. Left knee pain, likely traumatic  Will review AED strips with Dr. Caryl Comes. It does not appears shock was ever actually administered. Differential of loss of consciousness at this point includes seizure or possible arrhythmia such as VT, VF, PEA. Will discuss need for CTA to  exclude PE given elevated d-dimer and events. Note the patient is not tachycardic, tachypnic or hypoxic. Will also obtain left knee film and based on results may need ortho eval. 2D echo is also pending.   Addendum: per d/w MD, will proceed with CT angio to rule out PE, signal averaged EKG, and CT head. Anticipate we can proceed with cardiac MRI tomorrow if renal function is OK to exclude infiltrative disease. Will involve neurology to get their input on whether they think this was a seizure. He does have features suspicious for such. Will discontinue amiodarone.  2nd  addendum: see next progress note regarding brain/liver lesions. Story now felt more c/w seizure.  Signed, Melina Copa PA-C 03/24/2015, 8:49 AM   The story is notable for  Prodrome of visual distrubance, facial sensations and lateral tongue biting all of which suggest a primary neruological event as opposed to cardiac There are also reports of Sz activity   Will do head CT and ask Neuro to see(done)    The data from the AED do not support a primary arrhythmic issue, although there is some worrisome VT-NS  As noted by addendum, the clicnal concerns have been supported by the finding of intracranial process with evidence of possible liver mets  Will hold on cardiac eval for now and have asked IM to assume care  We will continue to follow

## 2015-03-24 NOTE — Progress Notes (Signed)
Note dictated. Waiting for brain mri

## 2015-03-24 NOTE — Progress Notes (Signed)
  Echocardiogram 2D Echocardiogram has been performed.  Johny Chess 03/24/2015, 9:30 AM

## 2015-03-24 NOTE — Consult Note (Signed)
NEURO HOSPITALIST CONSULT NOTE    Reason for Consult: Seizure  HPI:                                                                                                                                          Walter Wilkins is an 59 y.o. male who was at a baseball game. He was about to get up and leave when he felt his right cheek draw downward and then he fell unconscious. He does not recall anything until he was in the hospital. By standers noted seizure like activity and then he became apneic and blue.  CPR was initiated. AED was placed but no shock was given.  HE then started to breath and had SROC. Wife states he did urinate on himself.  Currently he is back to his baseline.  HE denies history of seizure, family history of seizure.   Of note, he has been followed in the past for abnormal masses in his lungs that have been unchanged. CT angio chest did show Multiple low-density lesions in the liver worrisome for metastatic disease on this visit.   Past Medical History  Diagnosis Date  . Diabetes mellitus without complication   . H/O gastric bypass     a. ~2011 at [facility].  . Acid reflux     Past Surgical History  Procedure Laterality Date  . Left heart catheterization with coronary angiogram N/A 03/23/2015    Procedure: LEFT HEART CATHETERIZATION WITH CORONARY ANGIOGRAM;  Surgeon: Burnell Blanks, MD;  Location: [facility] CATH LAB;  Service: Cardiovascular;  Laterality: N/A;    Family History  Problem Relation Age of Onset  . Valvular heart disease Father     H/o pig valve  . Sudden death Neg Hx     No family history of sudden cardiac death     Social History:  reports that he has never smoked. He does not have any smokeless tobacco history on file. He reports that he drinks alcohol. He reports that he does not use illicit drugs.  No Known Allergies  MEDICATIONS:                                                                                                                      Prior to Admission:  Prescriptions prior to admission  Medication Sig Dispense Refill Last Dose  . ALPRAZolam (XANAX) 0.25 MG tablet Take 0.25 mg by mouth 2 (two) times daily as needed for anxiety. Last Refill 08/22/2014   03/22/2015 at Unknown time  . aspirin EC 81 MG tablet Take 81 mg by mouth daily.   03/23/2015 at Unknown time  . atorvastatin (LIPITOR) 40 MG tablet Take 40 mg by mouth daily. Last Refill 08/22/2014   Past Week at Unknown time  . lisinopril (PRINIVIL,ZESTRIL) 5 MG tablet Take 5 mg by mouth daily. Last Refill 08/10/2014   Past Week at Unknown time  . metFORMIN (GLUCOPHAGE) 1000 MG tablet Take 1,000 mg by mouth 2 (two) times daily with a meal. Last refill 08/23/2014   03/23/2015 at Unknown time  . omeprazole (PRILOSEC) 20 MG capsule Take 20 mg by mouth daily. Last Refill 08/22/2014   03/23/2015 at Unknown time  . sertraline (ZOLOFT) 100 MG tablet Take 100 mg by mouth 2 (two) times daily. Last refill 08/22/2014   03/23/2015 at Unknown time   Scheduled: . aspirin  324 mg Oral NOW   Or  . aspirin  300 mg Rectal NOW  . aspirin EC  81 mg Oral Daily  . insulin aspart  0-9 Units Subcutaneous TID WC  . sodium chloride  3 mL Intravenous Q12H     ROS:                                                                                                                                       History obtained from the patient  General ROS: negative for - chills, fatigue, fever, night sweats, weight gain or weight loss Psychological ROS: negative for - behavioral disorder, hallucinations, memory difficulties, mood swings or suicidal ideation Ophthalmic ROS: negative for - blurry vision, double vision, eye pain or loss of vision ENT ROS: negative for - epistaxis, nasal discharge, oral lesions, sore throat, tinnitus or vertigo Allergy and Immunology ROS: negative for - hives or itchy/watery eyes Hematological and Lymphatic ROS: negative for - bleeding problems, bruising or  swollen lymph nodes Endocrine ROS: negative for - galactorrhea, hair pattern changes, polydipsia/polyuria or temperature intolerance Respiratory ROS: negative for - cough, hemoptysis, shortness of breath or wheezing Cardiovascular ROS: negative for - chest pain, dyspnea on exertion, edema or irregular heartbeat Gastrointestinal ROS: negative for - abdominal pain, diarrhea, hematemesis, nausea/vomiting or stool incontinence Genito-Urinary ROS: negative for - dysuria, hematuria, incontinence or urinary frequency/urgency Musculoskeletal ROS: negative for - joint swelling or muscular weakness Neurological ROS: as noted in HPI Dermatological ROS: negative for rash and skin lesion changes   Blood pressure 115/63, pulse 63, temperature 98.3 F (36.8 C), temperature source Oral, resp. rate 14, height 6' (1.829 m), weight 118.4 kg (261 lb 0.4 oz), SpO2 92 %.   Neurologic Examination:  HEENT-  Normocephalic, no lesions, without obvious abnormality.  Normal external eye and conjunctiva.  Normal TM's bilaterally.  Normal auditory canals and external ears. Normal external nose, mucus membranes and septum.  Normal pharynx. Cardiovascular- S1, S2 normal, pulses palpable throughout   Lungs- chest clear, no wheezing, rales, normal symmetric air entry Abdomen- normal findings: bowel sounds normal Extremities- no edema Lymph-no adenopathy palpable Musculoskeletal-no joint tenderness, deformity or swelling Skin-multiple abrasions on the right  Neurological Examination Mental Status: Alert, oriented, thought content appropriate.  Speech fluent without evidence of aphasia.  Able to follow 3 step commands without difficulty. Cranial Nerves: II: Discs flat bilaterally; Visual fields grossly normal, pupils equal, round, reactive to light and accommodation III,IV, VI: ptosis not present, extra-ocular motions  intact bilaterally V,VII: right facial droop, facial light touch sensation normal bilaterally VIII: hearing normal bilaterally IX,X: uvula rises symmetrically XI: bilateral shoulder shrug XII: midline tongue extension Motor: Right : Upper extremity   5/5    Left:     Upper extremity   5/5  Lower extremity   5/5     Lower extremity   5/5 Tone and bulk:normal tone throughout; no atrophy noted Sensory: Pinprick and light touch intact throughout, bilaterally Deep Tendon Reflexes: 2+ and symmetric throughout Plantars: Right: downgoing   Left: downgoing Cerebellar: normal finger-to-nose,  normal heel-to-shin test Gait: not tested.     Lab Results: Basic Metabolic Panel:  Recent Labs Lab 03/23/15 1428 03/23/15 1449 03/23/15 2120 03/24/15 0318  NA 139 138  --  137  K 4.7 4.6  --  4.3  CL 101 102  --  103  CO2 20  --   --  29  GLUCOSE 181* 183*  --  126*  BUN 14 20  --  13  CREATININE 1.27 1.30 0.91 0.93  CALCIUM 9.1  --   --  8.5  MG  --   --  1.7  --     Liver Function Tests:  Recent Labs Lab 03/24/15 0318  AST 28  ALT 22  ALKPHOS 135*  BILITOT 0.5  PROT 6.0  ALBUMIN 3.1*   No results for input(s): LIPASE, AMYLASE in the last 168 hours. No results for input(s): AMMONIA in the last 168 hours.  CBC:  Recent Labs Lab 03/23/15 1428 03/23/15 1449 03/23/15 2120 03/24/15 0318  WBC 10.1  --  8.5 9.0  HGB 15.5 17.3* 12.0* 13.9  HCT 46.2 51.0 36.0* 42.0  MCV 90.4  --  89.1 89.4  PLT 221  --  187 190    Cardiac Enzymes: No results for input(s): CKTOTAL, CKMB, CKMBINDEX, TROPONINI in the last 168 hours.  Lipid Panel:  Recent Labs Lab 03/24/15 0318  CHOL 136  TRIG 129  HDL 42  CHOLHDL 3.2  VLDL 26  LDLCALC 68    CBG:  Recent Labs Lab 03/23/15 1433 03/23/15 1706 03/23/15 2127 03/24/15 0811 03/24/15 1218  GLUCAP 153* 155* 194* 128* 146*    Microbiology: Results for orders placed or performed during the hospital encounter of 03/23/15  MRSA  PCR Screening     Status: None   Collection Time: 03/23/15  4:17 PM  Result Value Ref Range Status   MRSA by PCR NEGATIVE NEGATIVE Final    Comment:        The GeneXpert MRSA Assay (FDA approved for NASAL specimens only), is one component of a comprehensive MRSA colonization surveillance program. It is not intended to diagnose MRSA infection nor to guide or monitor treatment for  MRSA infections.     Coagulation Studies:  Recent Labs  03/23/15 2120 03/24/15 0318  LABPROT 14.9 13.7  INR 1.16 1.04    Imaging: Ct Head Wo Contrast  03/24/2015   CLINICAL DATA:  Syncopal episode/ cardiac arrest yesterday.  EXAM: CT HEAD WITHOUT CONTRAST  TECHNIQUE: Contiguous axial images were obtained from the base of the skull through the vertex without intravenous contrast.  COMPARISON:  None.  FINDINGS: There is a 1.9 cm mildly hyperattenuating cortical lesion in the high left frontal lobe at the skull vertex with minimal surrounding edema. The appearance is most consistent with a mass rather primary parenchymal hemorrhage. No mass, intracranial hemorrhage, or edema is identified elsewhere on this unenhanced study. There is no evidence of acute infarct, midline shift, or extra-axial fluid collection. Ventricles and sulci are normal.  Orbits are unremarkable. The visualized paranasal sinuses are clear. No significant mastoid effusion is seen. No destructive skull lesions are identified.  IMPRESSION: 1.9 cm high left frontal lobe mass with minimal surrounding edema, most consistent with a brain metastasis given findings on concurrent chest CT. Further evaluation with brain MRI (without and with contrast) is recommended.  These results were called by telephone at the time of interpretation on 03/24/2015 at 12:25 pm to PA [facility] , who verbally acknowledged these results.   Electronically Signed   By: Logan Bores   On: 03/24/2015 12:32   Ct Angio Chest Pe W/cm &/or Wo Cm  03/24/2015   CLINICAL DATA:  Cardiac  arrest.  EXAM: CT ANGIOGRAPHY CHEST WITH CONTRAST  TECHNIQUE: Multidetector CT imaging of the chest was performed using the standard protocol during bolus administration of intravenous contrast. Multiplanar CT image reconstructions and MIPs were obtained to evaluate the vascular anatomy.  CONTRAST:  99mL OMNIPAQUE IOHEXOL 350 MG/ML SOLN  COMPARISON:  Chest x-ray dated 03/23/2015 and chest CT dated 02/25/2013  FINDINGS: There fractures of the left sixth seventh and eighth ribs anteriorly and of the right and of the right fourth through eighth ribs anteriorly secondary to recent CPR.  There are no pulmonary emboli. There is consolidation of most of the left lower lobe and there is a streaky interstitial infiltrate in the posterior lateral aspect of the left upper lobe. These findings probably represent aspiration pneumonitis. The right lung is clear. There is 1 enlarged left hilar lymph node, 16 mm in diameter on image 113 of series 407.  Images of the upper abdomen demonstrate multiple poorly defined low-density lesions in the liver worrisome for metastatic disease. There is a 3.4 cm lesion in the right lobe on image 99 of series 401. There are least 2 lesions in the left left lobe and at least 7 lesions in the right lobe.  The patient has had gastric bypass surgery and cholecystectomy.  Review of the MIP images confirms the above findings.  IMPRESSION: 1. Multiple low-density lesions in the liver worrisome for metastatic disease. 2. No pulmonary emboli. 3. Consolidative infiltrate in the left lower lobe with interstitial infiltrate in the left upper lobe. I suspect both of these represent aspiration pneumonitis. 4. Single enlarged left hilar lymph node. 5. Multiple bilateral anterior rib fractures most likely secondary to recent CPR.   Electronically Signed   By: Lorriane Shire M.D.   On: 03/24/2015 11:51   Dg Chest Portable 1 View  03/23/2015   CLINICAL DATA:  Syncope at baseball game with CPR.  EXAM: PORTABLE  CHEST - 1 VIEW  COMPARISON:  None.  FINDINGS: There is  airspace opacity throughout the left lung. Heart size and upper mediastinal contours are within normal limits for technique. No evidence of effusion or air leak. No evidence of rib fracture post CPR.  IMPRESSION: Airspace disease throughout the left lung, likely aspiration given the history. CPR related lung contusion considered unlikely given no visible rib fracture.   Electronically Signed   By: Monte Fantasia M.D.   On: 03/23/2015 15:03   Dg Knee Left Port  03/24/2015   CLINICAL DATA:  Fall on concrete at baseball game yesterday. Landing on left knee. Unable to bear weight.  EXAM: PORTABLE LEFT KNEE - 1-2 VIEW  COMPARISON:  None.  FINDINGS: Small joint effusion. No acute bony abnormality. No fracture, subluxation or dislocation. Soft tissues are intact.  IMPRESSION: Small joint effusion.  No acute bony abnormality.   Electronically Signed   By: Rolm Baptise M.D.   On: 03/24/2015 10:15    Etta Quill PA-C [facility] [phone]  03/24/2015, 12:37 PM  Patient seen and examined.  Clinical course and management discussed.  Necessary edits performed.  I agree with the above.  Assessment and plan of care developed and discussed below.    Assessment/Plan: 59 year old male with what is described as new onset seizure.  Work up has included a CT of the head that has been personally reviewed and shows a high left frontal lobe mass with minimal associated edema.  Question of metastasis has been raised.  Further work up recommended.  Recommendations: 1.  Keppra 500mg  BID 2.  Agree with biopsy of a more peripheral lesion for tissue diagnosis which will lead further treatment.   3.  Decadron 4mg  TID 4.  Seizure precautions 5.  Ativan prn seizure activity. 6.  MRI of the brain with and without contrast   Alexis Goodell, MD [facility] [phone]  03/24/2015  4:06 PM

## 2015-03-24 NOTE — Consult Note (Signed)
[facility] History and Physical                                                                                    Walter Wilkins, is a 59 y.o. male  MRN: [id number]   DOB - 1956-04-28  Admit Date - 03/23/2015  Outpatient Primary MD for the patient is located in [place], [place]  Requesting physician: Dr. Julianne Handler  Reason for consultation: Brain and liver lesions concerning for metastatic carcinoma without known primary  With History of -  Past Medical History  Diagnosis Date  . Diabetes mellitus without complication   . H/O gastric bypass     a. ~2011 at [facility].  . Acid reflux       Past Surgical History  Procedure Laterality Date  . Left heart catheterization with coronary angiogram N/A 03/23/2015    Procedure: LEFT HEART CATHETERIZATION WITH CORONARY ANGIOGRAM;  Surgeon: Burnell Blanks, MD;  Location: [facility] CATH LAB;  Service: Cardiovascular;  Laterality: N/A;    in for   Chief Complaint  Patient presents with  . Loss of Consciousness     HPI 59 year old male patient with history of hypertension, diabetes on metformin, obesity status post remote gastric bypass procedure, no previous cardiac history. Presented to [facility] after a witnessed syncopal event at a local baseball pain. There was some confusion at the scene as to whether the patient has actually experienced a cardiac arrest and whether the AED delivered a shock. While in the ER patient return to normal mentation and had normal hemodynamic findings. Initial EKG without ischemic findings. Cardiology admitted the patient for presumed cardiac syncopal event. Since admission the patient has undergone cardiac catheterization which revealed no angiographic evidence of CAD and normal systolic function. Because of unexplained syncope a d-dimer had been checked and this was elevated at 3.99. CT angiogram of the chest was completed that revealed no evidence of pulmonary embolism. There were findings on  the left lung concerning for aspiration pneumonitis. More concerning was a finding of multiple low-density lesions in the liver worrisome for metastatic disease. Because of unexplained syncope and these new concerning findings, neurology was consulted. Due to concerns of likely underlying brain lesion precipitating seizure activity leading to syncopal event CT of the head without contrast was obtained. This revealed a 1.9 cm high left frontal lobe mass with minimal surrounding edema most consistent with brain metastasis given concurrent findings on chest CT. Radiologist recommended MRI of the brain with and without contrast. Because of all of these new findings since admission and no clear-cut cardiac etiology to the patient's current condition we have agreed to assume care of this patient.  Review of Systems   In addition to the HPI above,  No Fever-chills, myalgias or other constitutional symptoms No Headache, changes with Vision or hearing, new weakness, tingling, numbness in any extremity, No problems swallowing food or Liquids, patient and wife report patient has been depressed since December after job loss and has had associated decreased appetite but he has also been experiencing symptoms of early satiety and feeling nauseated after eating and occasionally has thrown up. No Chest pain, Cough or  Shortness of Breath, palpitations, orthopnea or DOE No Abdominal pain, N/V; no melena or hematochezia, no dark tarry stools, Bowel movements are regular, No dysuria, hematuria or flank pain No new skin rashes, lesions, masses or bruises, No new joints pains-aches No polyuria, polydypsia or polyphagia,  *A full 10 point Review of Systems was done, except as stated above, all other Review of Systems were negative.  Social History History  Substance Use Topics  . Smoking status: Never Smoker   . Smokeless tobacco: Not on file  . Alcohol Use: 0.0 oz/week    0 Standard drinks or equivalent per week      Comment: Occasionally socially - once a week or less    Family History Family History  Problem Relation Age of Onset  . Valvular heart disease Father     H/o pig valve  . Sudden death Neg Hx     No family history of sudden cardiac death    Prior to Admission medications   Medication Sig Start Date End Date Taking? Authorizing Provider  ALPRAZolam (XANAX) 0.25 MG tablet Take 0.25 mg by mouth 2 (two) times daily as needed for anxiety. Last Refill 08/22/2014   Yes Historical Provider, MD  aspirin EC 81 MG tablet Take 81 mg by mouth daily.   Yes Historical Provider, MD  atorvastatin (LIPITOR) 40 MG tablet Take 40 mg by mouth daily. Last Refill 08/22/2014   Yes Historical Provider, MD  lisinopril (PRINIVIL,ZESTRIL) 5 MG tablet Take 5 mg by mouth daily. Last Refill 08/10/2014   Yes Historical Provider, MD  metFORMIN (GLUCOPHAGE) 1000 MG tablet Take 1,000 mg by mouth 2 (two) times daily with a meal. Last refill 08/23/2014   Yes Historical Provider, MD  omeprazole (PRILOSEC) 20 MG capsule Take 20 mg by mouth daily. Last Refill 08/22/2014   Yes Historical Provider, MD  sertraline (ZOLOFT) 100 MG tablet Take 100 mg by mouth 2 (two) times daily. Last refill 08/22/2014   Yes Historical Provider, MD    No Known Allergies  Physical Exam  Vitals  Blood pressure 115/63, pulse 63, temperature 98.3 F (36.8 C), temperature source Oral, resp. rate 14, height 6' (1.829 m), weight 261 lb 0.4 oz (118.4 kg), SpO2 92 %.   General:  In no acute distress, appears healthy and well nourished  Psych:  Normal affect, Denies Suicidal or Homicidal ideations, Awake Alert, Oriented X 3. Speech and thought patterns are clear and appropriate, no apparent short term memory deficits  Neuro:   No focal neurological deficits, CN II through XII intact, Strength 5/5 all 4 extremities, Sensation intact all 4 extremities.  ENT:  Ears and Eyes appear Normal, Conjunctivae clear, PER. Moist oral mucosa without erythema or  exudates.  Neck:  Supple, No lymphadenopathy appreciated  Chest: Focused male breast exam was undertaken, small mobile node-like area and left upper outer quadrant which is nontender-seems consistent with superficial lymph node, very tender bilateral rib cage in setting of known rib fractures  Respiratory:  Symmetrical chest wall movement, Good air movement bilaterally, CTAB. Room Air  Cardiac:  RRR, No Murmurs, no LE edema noted, no JVD, No carotid bruits, peripheral pulses palpable at 2+  Abdomen:  Positive bowel sounds, Soft, Non tender, Non distended,  No masses appreciated, no obvious hepatosplenomegaly  Skin:  No Cyanosis, Normal Skin Turgor, No Skin Rash or Bruise.  Extremities: Symmetrical without obvious trauma or injury,  no effusions.  Data Review  CBC  Recent Labs Lab 03/23/15 1428 03/23/15 1449 03/23/15 2120  03/24/15 0318  WBC 10.1  --  8.5 9.0  HGB 15.5 17.3* 12.0* 13.9  HCT 46.2 51.0 36.0* 42.0  PLT 221  --  187 190  MCV 90.4  --  89.1 89.4  MCH 30.3  --  29.7 29.6  MCHC 33.5  --  33.3 33.1  RDW 13.4  --  13.3 13.3    Chemistries   Recent Labs Lab 03/23/15 1428 03/23/15 1449 03/23/15 2120 03/24/15 0318  NA 139 138  --  137  K 4.7 4.6  --  4.3  CL 101 102  --  103  CO2 20  --   --  29  GLUCOSE 181* 183*  --  126*  BUN 14 20  --  13  CREATININE 1.27 1.30 0.91 0.93  CALCIUM 9.1  --   --  8.5  MG  --   --  1.7  --   AST  --   --   --  28  ALT  --   --   --  22  ALKPHOS  --   --   --  135*  BILITOT  --   --   --  0.5    estimated creatinine clearance is 112.2 mL/min (by C-G formula based on Cr of 0.93).   Recent Labs  03/23/15 2120  TSH 3.505    Coagulation profile  Recent Labs Lab 03/23/15 2120 03/24/15 0318  INR 1.16 1.04     Recent Labs  03/23/15 2120  DDIMER 3.99*    Cardiac Enzymes No results for input(s): CKMB, TROPONINI, MYOGLOBIN in the last 168 hours.  Invalid input(s): CK  Invalid input(s):  POCBNP  Urinalysis No results found for: COLORURINE, APPEARANCEUR, LABSPEC, PHURINE, GLUCOSEU, HGBUR, BILIRUBINUR, KETONESUR, PROTEINUR, UROBILINOGEN, NITRITE, LEUKOCYTESUR  Imaging results:   Ct Head Wo Contrast  03/24/2015   CLINICAL DATA:  Syncopal episode/ cardiac arrest yesterday.  EXAM: CT HEAD WITHOUT CONTRAST  TECHNIQUE: Contiguous axial images were obtained from the base of the skull through the vertex without intravenous contrast.  COMPARISON:  None.  FINDINGS: There is a 1.9 cm mildly hyperattenuating cortical lesion in the high left frontal lobe at the skull vertex with minimal surrounding edema. The appearance is most consistent with a mass rather primary parenchymal hemorrhage. No mass, intracranial hemorrhage, or edema is identified elsewhere on this unenhanced study. There is no evidence of acute infarct, midline shift, or extra-axial fluid collection. Ventricles and sulci are normal.  Orbits are unremarkable. The visualized paranasal sinuses are clear. No significant mastoid effusion is seen. No destructive skull lesions are identified.  IMPRESSION: 1.9 cm high left frontal lobe mass with minimal surrounding edema, most consistent with a brain metastasis given findings on concurrent chest CT. Further evaluation with brain MRI (without and with contrast) is recommended.  These results were called by telephone at the time of interpretation on 03/24/2015 at 12:25 pm to PA [facility] , who verbally acknowledged these results.   Electronically Signed   By: Logan Bores   On: 03/24/2015 12:32   Ct Angio Chest Pe W/cm &/or Wo Cm  03/24/2015   CLINICAL DATA:  Cardiac arrest.  EXAM: CT ANGIOGRAPHY CHEST WITH CONTRAST  TECHNIQUE: Multidetector CT imaging of the chest was performed using the standard protocol during bolus administration of intravenous contrast. Multiplanar CT image reconstructions and MIPs were obtained to evaluate the vascular anatomy.  CONTRAST:  49mL OMNIPAQUE IOHEXOL 350 MG/ML SOLN   COMPARISON:  Chest x-ray dated 03/23/2015 and  chest CT dated 02/25/2013  FINDINGS: There fractures of the left sixth seventh and eighth ribs anteriorly and of the right and of the right fourth through eighth ribs anteriorly secondary to recent CPR.  There are no pulmonary emboli. There is consolidation of most of the left lower lobe and there is a streaky interstitial infiltrate in the posterior lateral aspect of the left upper lobe. These findings probably represent aspiration pneumonitis. The right lung is clear. There is 1 enlarged left hilar lymph node, 16 mm in diameter on image 113 of series 407.  Images of the upper abdomen demonstrate multiple poorly defined low-density lesions in the liver worrisome for metastatic disease. There is a 3.4 cm lesion in the right lobe on image 99 of series 401. There are least 2 lesions in the left left lobe and at least 7 lesions in the right lobe.  The patient has had gastric bypass surgery and cholecystectomy.  Review of the MIP images confirms the above findings.  IMPRESSION: 1. Multiple low-density lesions in the liver worrisome for metastatic disease. 2. No pulmonary emboli. 3. Consolidative infiltrate in the left lower lobe with interstitial infiltrate in the left upper lobe. I suspect both of these represent aspiration pneumonitis. 4. Single enlarged left hilar lymph node. 5. Multiple bilateral anterior rib fractures most likely secondary to recent CPR.   Electronically Signed   By: Lorriane Shire M.D.   On: 03/24/2015 11:51   Dg Chest Portable 1 View  03/23/2015   CLINICAL DATA:  Syncope at baseball game with CPR.  EXAM: PORTABLE CHEST - 1 VIEW  COMPARISON:  None.  FINDINGS: There is airspace opacity throughout the left lung. Heart size and upper mediastinal contours are within normal limits for technique. No evidence of effusion or air leak. No evidence of rib fracture post CPR.  IMPRESSION: Airspace disease throughout the left lung, likely aspiration given the  history. CPR related lung contusion considered unlikely given no visible rib fracture.   Electronically Signed   By: Monte Fantasia M.D.   On: 03/23/2015 15:03   Dg Knee Left Port  03/24/2015   CLINICAL DATA:  Fall on concrete at baseball game yesterday. Landing on left knee. Unable to bear weight.  EXAM: PORTABLE LEFT KNEE - 1-2 VIEW  COMPARISON:  None.  FINDINGS: Small joint effusion. No acute bony abnormality. No fracture, subluxation or dislocation. Soft tissues are intact.  IMPRESSION: Small joint effusion.  No acute bony abnormality.   Electronically Signed   By: Rolm Baptise M.D.   On: 03/24/2015 10:15     EKG: 03/24/15: Normal sinus rhythm with sinus arrhythmia and no ischemic changes, normal QTC   Assessment & Plan  Principal Problem:   Syncope and collapse -Based on current evaluation noncardiac in etiology and not related to PE -Suspect patient had seizure activity and setting up new finding of left frontal brain lesion  Active Problems:   Left frontal lobe mass -New finding this admission and likely precipitated syncopal event; possibly from seizure activity -Patient does have mild surrounding edema so we'll begin Decadron 4 mg IV every 6 hours -Neurology has started Keppra -Check MRI brain with and without contrast as recommended by radiologist to further clarify this lesion; if lesion confirmed to be not related to parenchymal hemorrhage pharmacological DVT prophylaxis needs to be initiated -Neurosurgery has been consulted -No apparent seizure activity since admission    Liver lesions -Appears consistent with metastatic disease but no primary elucidated at this juncture -Check noncontrasted  CT abdomen and pelvis to see if primary lesion can be identified -Does have tiny node in left upper outer quadrant of breast which at this point does not seem consistent with a concerning breast lesion but patient's mother does have history of breast cancer so if no other malignant source  can be identified may need to pursue outpatient mail mammography -If no other concerning lesions identified on scans may need to pursue biopsy of liver lesions with the assistance of interventional radiology -Patient reports had colonoscopy 2 years ago with Dr. [place] in [facility] -Has been greater than 5 years since last EGD; may need to pursue since patient is reporting early satiety and could have gastric, esophageal or duodenal/small bowel mass -Check stools for blood    Diabetes mellitus type 2 in obese -Continue to hold metformin while acutely ill -Continue sensitive sliding scale insulin -Watch for hyperglycemia with administration of Decadron; may need to add short-term long-acting insulin to improve glycemic control    Aspiration pneumonitis -Noted on left lung with consolidating features but patient without fever or leukocytosis -No indication to begin antibiotics at this juncture -Focus on pulmonary toileting    Multiple rib fractures -As above -Focus on pain management and pulmonary toileting with flutter valve    HTN (hypertension) -Blood pressure currently soft so home Restoril on hold    History of gastric bypass -Reporting issues related to early satiety so may need EGD/GI consultation as noted above especially if CT abdomen and pelvis unrevealing    Chronic diastolic heart failure -Findings on 2-D echocardiogram this admission -Currently compensated    Reactive depression -Continue Zoloft and Xanax    DVT Prophylaxis: SCDs-If MRI confirms the frontal mass is not related to parenchymal hemorrhage the patient needs to be started on pharmacological DVT prophylaxis  Family Communication: Wife and brother at bedside with patient's permission  Code Status: Full code   Condition:  Stable  Time spent in minutes : 60   ELLIS,ALLISON L. ANP on 03/24/2015 at 1:43 PM  Between 7am to 7pm - Pager - [phone]  After 7pm go to [email or web address] - password  [email or web address]  And look for the night coverage person covering me after hours  [facility]

## 2015-03-25 ENCOUNTER — Inpatient Hospital Stay (HOSPITAL_COMMUNITY): Payer: Commercial Managed Care - PPO

## 2015-03-25 LAB — GLUCOSE, CAPILLARY
GLUCOSE-CAPILLARY: 132 mg/dL — AB (ref 70–99)
GLUCOSE-CAPILLARY: 149 mg/dL — AB (ref 70–99)
GLUCOSE-CAPILLARY: 288 mg/dL — AB (ref 70–99)
Glucose-Capillary: 231 mg/dL — ABNORMAL HIGH (ref 70–99)
Glucose-Capillary: 407 mg/dL — ABNORMAL HIGH (ref 70–99)

## 2015-03-25 LAB — HEMOGLOBIN A1C
Hgb A1c MFr Bld: 7.2 % — ABNORMAL HIGH (ref 4.8–5.6)
Mean Plasma Glucose: 160 mg/dL

## 2015-03-25 LAB — BASIC METABOLIC PANEL
Anion gap: 7 (ref 5–15)
BUN: 14 mg/dL (ref 6–23)
CO2: 27 mmol/L (ref 19–32)
Calcium: 8.9 mg/dL (ref 8.4–10.5)
Chloride: 100 mmol/L (ref 96–112)
Creatinine, Ser: 0.96 mg/dL (ref 0.50–1.35)
GFR calc Af Amer: >90 mL/min (ref >90)
GFR calc non Af Amer: 88 mL/min — ABNORMAL LOW (ref >90)
Glucose, Bld: 192 mg/dL — ABNORMAL HIGH (ref 70–99)
Potassium: 4.4 mmol/L (ref 3.5–5.1)
SODIUM: 134 mmol/L — AB (ref 135–145)

## 2015-03-25 LAB — MAGNESIUM: Magnesium: 2.1 mg/dL (ref 1.5–2.5)

## 2015-03-25 MED ORDER — INSULIN ASPART 100 UNIT/ML ~~LOC~~ SOLN
8.0000 [IU] | Freq: Once | SUBCUTANEOUS | Status: AC
  Administered 2015-03-25: 8 [IU] via SUBCUTANEOUS

## 2015-03-25 MED ORDER — IOHEXOL 300 MG/ML  SOLN
100.0000 mL | Freq: Once | INTRAMUSCULAR | Status: AC | PRN
  Administered 2015-03-25: 100 mL via INTRAVENOUS

## 2015-03-25 MED ORDER — GADOBENATE DIMEGLUMINE 529 MG/ML IV SOLN
20.0000 mL | Freq: Once | INTRAVENOUS | Status: AC | PRN
  Administered 2015-03-25: 20 mL via INTRAVENOUS

## 2015-03-25 MED ORDER — IOHEXOL 300 MG/ML  SOLN
25.0000 mL | INTRAMUSCULAR | Status: AC
  Administered 2015-03-25 (×2): 25 mL via ORAL

## 2015-03-25 NOTE — Progress Notes (Signed)
No angiographic coronary disease. Arrest may be secondary to a primary CNS event-  Probably seizure. Mass seen on head CT and MRI. Echo shows normal LV function.  Troponin is negative. Do not suspect primary cardiac cause of his event. Cardiology will sign-off. Call with questions.  Pixie Casino, MD, [facility] Attending Cardiologist [place]

## 2015-03-25 NOTE — Progress Notes (Signed)
Subjective: Patient with no new complaints.  No seizure activity noted overnight.    Objective: Current vital signs: BP 123/64 mmHg  Pulse 57  Temp(Src) 97.6 F (36.4 C) (Oral)  Resp 15  Ht 6' (1.829 m)  Wt 110.6 kg (243 lb 13.3 oz)  BMI 33.06 kg/m2  SpO2 96% Vital signs in last 24 hours: Temp:  [97 F (36.1 C)-98.3 F (36.8 C)] 97.6 F (36.4 C) (04/02 0823) Pulse Rate:  [52-81] 57 (04/02 0823) Resp:  [13-15] 15 (04/02 0823) BP: (114-142)/(63-85) 123/64 mmHg (04/02 0823) SpO2:  [92 %-96 %] 96 % (04/02 0823) Weight:  [110.6 kg (243 lb 13.3 oz)] 110.6 kg (243 lb 13.3 oz) (04/02 0500)  Intake/Output from previous day: 04/01 0701 - 04/02 0700 In: 127.1 [I.V.:127.1] Out: 1600 [Urine:1600] Intake/Output this shift:   Nutritional status: Diet NPO time specified  Neurologic Exam: Mental Status: Alert, oriented, thought content appropriate. Speech fluent without evidence of aphasia. Able to follow 3 step commands without difficulty. Cranial Nerves: II: Discs flat bilaterally; Visual fields grossly normal, pupils equal, round, reactive to light and accommodation III,IV, VI: ptosis not present, extra-ocular motions intact bilaterally V,VII: right facial droop, facial light touch sensation normal bilaterally VIII: hearing normal bilaterally IX,X: uvula rises symmetrically XI: bilateral shoulder shrug XII: midline tongue extension Motor: 5/5 throughout Sensory: Pinprick and light touch intact throughout, bilaterally Deep Tendon Reflexes: 2+ and symmetric throughout Plantars: Right: downgoingLeft: downgoing Cerebellar: normal finger-to-nose, normal heel-to-shin test   Lab Results: Basic Metabolic Panel:  Recent Labs Lab 03/23/15 1428 03/23/15 1449 03/23/15 2120 03/24/15 0318 03/25/15 0237  NA 139 138  --  137 134*  K 4.7 4.6  --  4.3 4.4  CL 101 102  --  103 100  CO2 20  --   --  29 27  GLUCOSE 181* 183*  --  126* 192*  BUN 14  20  --  13 14  CREATININE 1.27 1.30 0.91 0.93 0.96  CALCIUM 9.1  --   --  8.5 8.9  MG  --   --  1.7  --  2.1    Liver Function Tests:  Recent Labs Lab 03/24/15 0318  AST 28  ALT 22  ALKPHOS 135*  BILITOT 0.5  PROT 6.0  ALBUMIN 3.1*   No results for input(s): LIPASE, AMYLASE in the last 168 hours. No results for input(s): AMMONIA in the last 168 hours.  CBC:  Recent Labs Lab 03/23/15 1428 03/23/15 1449 03/23/15 2120 03/24/15 0318  WBC 10.1  --  8.5 9.0  HGB 15.5 17.3* 12.0* 13.9  HCT 46.2 51.0 36.0* 42.0  MCV 90.4  --  89.1 89.4  PLT 221  --  187 190    Cardiac Enzymes: No results for input(s): CKTOTAL, CKMB, CKMBINDEX, TROPONINI in the last 168 hours.  Lipid Panel:  Recent Labs Lab 03/24/15 0318  CHOL 136  TRIG 129  HDL 42  CHOLHDL 3.2  VLDL 26  LDLCALC 68    CBG:  Recent Labs Lab 03/23/15 2127 03/24/15 0811 03/24/15 1218 03/24/15 1703 03/24/15 2113  GLUCAP 194* 128* 146* 155* 251*    Microbiology: Results for orders placed or performed during the hospital encounter of 03/23/15  MRSA PCR Screening     Status: None   Collection Time: 03/23/15  4:17 PM  Result Value Ref Range Status   MRSA by PCR NEGATIVE NEGATIVE Final    Comment:        The GeneXpert MRSA Assay (FDA approved for  NASAL specimens only), is one component of a comprehensive MRSA colonization surveillance program. It is not intended to diagnose MRSA infection nor to guide or monitor treatment for MRSA infections.     Coagulation Studies:  Recent Labs  03/23/15 2120 03/24/15 0318  LABPROT 14.9 13.7  INR 1.16 1.04    Imaging: Ct Head Wo Contrast  03/24/2015   CLINICAL DATA:  Syncopal episode/ cardiac arrest yesterday.  EXAM: CT HEAD WITHOUT CONTRAST  TECHNIQUE: Contiguous axial images were obtained from the base of the skull through the vertex without intravenous contrast.  COMPARISON:  None.  FINDINGS: There is a 1.9 cm mildly hyperattenuating cortical lesion in  the high left frontal lobe at the skull vertex with minimal surrounding edema. The appearance is most consistent with a mass rather primary parenchymal hemorrhage. No mass, intracranial hemorrhage, or edema is identified elsewhere on this unenhanced study. There is no evidence of acute infarct, midline shift, or extra-axial fluid collection. Ventricles and sulci are normal.  Orbits are unremarkable. The visualized paranasal sinuses are clear. No significant mastoid effusion is seen. No destructive skull lesions are identified.  IMPRESSION: 1.9 cm high left frontal lobe mass with minimal surrounding edema, most consistent with a brain metastasis given findings on concurrent chest CT. Further evaluation with brain MRI (without and with contrast) is recommended.  These results were called by telephone at the time of interpretation on 03/24/2015 at 12:25 pm to [facility] , who verbally acknowledged these results.   Electronically Signed   By: Logan Bores   On: 03/24/2015 12:32   Ct Angio Chest Pe W/cm &/or Wo Cm  03/24/2015   CLINICAL DATA:  Cardiac arrest.  EXAM: CT ANGIOGRAPHY CHEST WITH CONTRAST  TECHNIQUE: Multidetector CT imaging of the chest was performed using the standard protocol during bolus administration of intravenous contrast. Multiplanar CT image reconstructions and MIPs were obtained to evaluate the vascular anatomy.  CONTRAST:  45mL OMNIPAQUE IOHEXOL 350 MG/ML SOLN  COMPARISON:  Chest x-ray dated 03/23/2015 and chest CT dated 02/25/2013  FINDINGS: There fractures of the left sixth seventh and eighth ribs anteriorly and of the right and of the right fourth through eighth ribs anteriorly secondary to recent CPR.  There are no pulmonary emboli. There is consolidation of most of the left lower lobe and there is a streaky interstitial infiltrate in the posterior lateral aspect of the left upper lobe. These findings probably represent aspiration pneumonitis. The right lung is clear. There is 1 enlarged  left hilar lymph node, 16 mm in diameter on image 113 of series 407.  Images of the upper abdomen demonstrate multiple poorly defined low-density lesions in the liver worrisome for metastatic disease. There is a 3.4 cm lesion in the right lobe on image 99 of series 401. There are least 2 lesions in the left left lobe and at least 7 lesions in the right lobe.  The patient has had gastric bypass surgery and cholecystectomy.  Review of the MIP images confirms the above findings.  IMPRESSION: 1. Multiple low-density lesions in the liver worrisome for metastatic disease. 2. No pulmonary emboli. 3. Consolidative infiltrate in the left lower lobe with interstitial infiltrate in the left upper lobe. I suspect both of these represent aspiration pneumonitis. 4. Single enlarged left hilar lymph node. 5. Multiple bilateral anterior rib fractures most likely secondary to recent CPR.   Electronically Signed   By: Lorriane Shire M.D.   On: 03/24/2015 11:51   Mr Jeri Cos ZO Contrast  03/25/2015   CLINICAL DATA:  Seizure while at baseball game, patient does not recall event. No history of seizures. Suspected metastatic disease on recent imaging.  EXAM: MRI HEAD WITHOUT AND WITH CONTRAST  TECHNIQUE: Multiplanar, multiecho pulse sequences of the brain and surrounding structures were obtained without and with intravenous contrast.  CONTRAST:  59mL MULTIHANCE GADOBENATE DIMEGLUMINE 529 MG/ML IV SOLN  COMPARISON:  CT of the head March 24, 2015  FINDINGS: Focal susceptibility artifact and slight reduced diffusion associated with low low T1, low T2 homogeneously enhancing 17 x 15 mm LEFT cortical based frontal convexity mass with mild surrounding vasogenic edema. No additional parenchymal masses. LEFT frontal developmental venous anomaly.  No reduced diffusion to suggest acute ischemia. Ventricles and sulci are are overall normal for patient's age. A few scattered subcentimeter white matter FLAIR T2 hyperintensities may reflect chronic  small vessel ischemic disease, within normal range for patient's age. No midline shift or significant mass effect. No abnormal extra-axial fluid collections nor leptomeningeal enhancement. Normal major intracranial vascular flow voids seen at the skull base.  Mild motion degraded coronal T2 with relatively symmetric appearance of the hippocampi with normal morphology and signal characteristics.  Ocular globes and orbital contents are unremarkable. Paranasal sinuses and mastoid air cells are well aerated. No abnormal sellar expansion. No cerebellar tonsillar ectopia. No suspicious calvarial bone marrow signal.  IMPRESSION: Solitary 17 x 15 mm hemorrhagic LEFT frontal lobe mass corresponding to CT abnormality with imaging characteristics of metastasis.  Otherwise unremarkable MRI of the brain with contrast for age.   Electronically Signed   By: Elon Alas   On: 03/25/2015 03:41   Dg Chest Portable 1 View  03/23/2015   CLINICAL DATA:  Syncope at baseball game with CPR.  EXAM: PORTABLE CHEST - 1 VIEW  COMPARISON:  None.  FINDINGS: There is airspace opacity throughout the left lung. Heart size and upper mediastinal contours are within normal limits for technique. No evidence of effusion or air leak. No evidence of rib fracture post CPR.  IMPRESSION: Airspace disease throughout the left lung, likely aspiration given the history. CPR related lung contusion considered unlikely given no visible rib fracture.   Electronically Signed   By: Monte Fantasia M.D.   On: 03/23/2015 15:03   Dg Knee Left Port  03/24/2015   CLINICAL DATA:  Fall on concrete at baseball game yesterday. Landing on left knee. Unable to bear weight.  EXAM: PORTABLE LEFT KNEE - 1-2 VIEW  COMPARISON:  None.  FINDINGS: Small joint effusion. No acute bony abnormality. No fracture, subluxation or dislocation. Soft tissues are intact.  IMPRESSION: Small joint effusion.  No acute bony abnormality.   Electronically Signed   By: Rolm Baptise M.D.   On:  03/24/2015 10:15    Medications:  I have reviewed the patient's current medications. Scheduled: . atorvastatin  40 mg Oral q1800  . dexamethasone  4 mg Intravenous 4 times per day  . insulin aspart  0-9 Units Subcutaneous TID WC  . iohexol  25 mL Oral Q1 Hr x 2  . levETIRAcetam  500 mg Oral BID  . sertraline  100 mg Oral BID  . sodium chloride  3 mL Intravenous Q12H    Assessment/Plan: No further seizures.  Stable on Keppra.  No side effects noted.  MRI of the brain personally reviewed and shows only one solitary lesion in the left frontal lobe that is hemorrhagic.  W/U for tissue biopsy of primary underway.  Recommendations: 1.  Continue Decadron  and Keppra 2.  Seizure precautions   LOS: 2 days   Alexis Goodell, MD [facility] [phone] 03/25/2015  8:32 AM

## 2015-03-25 NOTE — Progress Notes (Signed)
Patient ID: Walter Wilkins, male   DOB: 06/13/1956, 59 y.o.   MRN: [id number] Mri brain pending. Neuro normal

## 2015-03-25 NOTE — Progress Notes (Signed)
[place] TEAM 1 - Stepdown/ICU TEAM Progress Note  Walter Wilkins GQQ:[id number] DOB: 12-13-1956 DOA: 03/23/2015 PCP: No primary care provider on file.  Admit HPI / Brief Narrative: 59 year old male with history of hypertension, diabetes, and obesity status post remote gastric bypass procedure who presented to [facility] after a witnessed syncopal event. There was some confusion at the scene as to whether the patient actually experienced a cardiac arrest and whether the AED delivered a shock. While in the ER patient returned to normal mentation and had normal hemodynamic findings. Initial EKG without ischemic findings. Cardiology admitted the patient for presumed cardiac syncopal event.   Since admission the patient has undergone cardiac catheterization which revealed no angiographic evidence of CAD and normal systolic function. A d-dimer was elevated at 3.99. CT angiogram of the chest revealed no evidence of pulmonary embolism. There were findings on the left lung concerning for aspiration pneumonitis. More concerning was a finding of multiple low-density lesions in the liver worrisome for metastatic disease. Neurology was consulted. Due to concerns of an underlying brain lesion precipitating seizure activity CT of the head was obtained. This revealed a 1.9 cm high left frontal lobe mass with minimal surrounding edema most consistent with brain metastasis. Radiologist recommended MRI of the brain with and without contrast. Because of all of these new findings [facility] assumed care of the pt on 03/24/2015.  HPI/Subjective: No new complaints today.  Denies cp, sob, n/v, or abdom pain.  Is anxious to get answers asap.    Assessment/Plan:  Left frontal lobe mass w/ hemorrhage -w/ surrounding edema tx w/ Decadron -Neurology has started Keppra -MRI notes 17x52mm hemorrhagic L frontal lobe mass w/ no other significant findings  -Neurosurgery has been consulted and is following   Liver lesions -Appears  consistent with metastatic disease but no primary elucidated at this juncture -CT abdomen and pelvis confirms liver lesions, but does not reveal evidence of a possible primary lesion  -Does have tiny node in left upper outer quadrant of breast - mother had breast CA   -will plan to pursue biopsy of liver lesions with the assistance of interventional radiology -Patient reports had colonoscopy 2 years ago with Dr. [place] in [facility] and it was "clean" - has had polyps removed via colo in prior years  -pt admits to unintentional weight loss of late, but can't quantify   Syncope and collapse -Based on current evaluation noncardiac in etiology and not related to PE -Suspect patient had seizure   Diabetes mellitus type 2  -no change in tx plan today - follow on decadron   Aspiration pneumonitis LLL and LUL  -Noted on left lung with consolidating features but patient without fever or leukocytosis -No indication to begin antibiotics at this juncture -prior lung nodule (for which he had remote PET) was in the LLL and therefore likely presently obscured by his pneumonitis   Multiple rib fractures -As above -Focus on pain management and pulmonary toileting with flutter valve  HTN  -Blood pressure well controlled at present   History of gastric bypass -Reporting issues related to early satiety - may need EGD if liver lesions not diagnostic   Chronic diastolic heart failure -Findings on 2-D echocardiogram this admission -Currently compensated  Reactive depression -Continue Zoloft and Xanax  Obesity - Body mass index is 33.06 kg/(m^2).  Code Status: FULL Family Communication: spoke w/ pt and brother at bedside  Disposition Plan: transfer to medical bed - schedule for IR liver bx asap  Consultants: Cards  Neurology  Neurosurgery   Antibiotics: none  DVT prophylaxis: SCDs  Objective: Blood pressure 123/64, pulse 57, temperature 97.6 F (36.4 C), temperature source Oral,  resp. rate 15, height 6' (1.829 m), weight 110.6 kg (243 lb 13.3 oz), SpO2 96 %.  Intake/Output Summary (Last 24 hours) at 03/25/15 1205 Last data filed at 03/25/15 0900  Gross per 24 hour  Intake      0 ml  Output   1600 ml  Net  -1600 ml   Exam: General: No acute respiratory distress Lungs: Clear to auscultation bilaterally without wheezes or crackles Cardiovascular: Regular rate and rhythm without murmur gallop or rub normal S1 and S2 Abdomen: Nontender, obese, soft, bowel sounds positive, no rebound, no ascites, no appreciable mass Extremities: No significant cyanosis, clubbing, or edema bilateral lower extremities  Data Reviewed: Basic Metabolic Panel:  Recent Labs Lab 03/23/15 1428 03/23/15 1449 03/23/15 2120 03/24/15 0318 03/25/15 0237  NA 139 138  --  137 134*  K 4.7 4.6  --  4.3 4.4  CL 101 102  --  103 100  CO2 20  --   --  29 27  GLUCOSE 181* 183*  --  126* 192*  BUN 14 20  --  13 14  CREATININE 1.27 1.30 0.91 0.93 0.96  CALCIUM 9.1  --   --  8.5 8.9  MG  --   --  1.7  --  2.1    Liver Function Tests:  Recent Labs Lab 03/24/15 0318  AST 28  ALT 22  ALKPHOS 135*  BILITOT 0.5  PROT 6.0  ALBUMIN 3.1*   Coags:  Recent Labs Lab 03/23/15 2120 03/24/15 0318  INR 1.16 1.04   CBC:  Recent Labs Lab 03/23/15 1428 03/23/15 1449 03/23/15 2120 03/24/15 0318  WBC 10.1  --  8.5 9.0  HGB 15.5 17.3* 12.0* 13.9  HCT 46.2 51.0 36.0* 42.0  MCV 90.4  --  89.1 89.4  PLT 221  --  187 190    CBG:  Recent Labs Lab 03/24/15 0811 03/24/15 1218 03/24/15 1703 03/24/15 2113 03/25/15 0822  GLUCAP 128* 146* 155* 251* 149*    Recent Results (from the past 240 hour(s))  MRSA PCR Screening     Status: None   Collection Time: 03/23/15  4:17 PM  Result Value Ref Range Status   MRSA by PCR NEGATIVE NEGATIVE Final    Comment:        The GeneXpert MRSA Assay (FDA approved for NASAL specimens only), is one component of a comprehensive MRSA  colonization surveillance program. It is not intended to diagnose MRSA infection nor to guide or monitor treatment for MRSA infections.      Studies:  Recent x-ray studies have been reviewed in detail by the Attending Physician  Scheduled Meds:  Scheduled Meds: . atorvastatin  40 mg Oral q1800  . dexamethasone  4 mg Intravenous 4 times per day  . insulin aspart  0-9 Units Subcutaneous TID WC  . levETIRAcetam  500 mg Oral BID  . sertraline  100 mg Oral BID  . sodium chloride  3 mL Intravenous Q12H    Time spent on care of this patient: 35 mins   MCCLUNG,JEFFREY T , MD   [facility] Office  [phone] Pager - Text Page per Shea Evans as per below:  On-Call/Text Page:      [email or web address]      password [email or web address]  If 7PM-7AM, please contact night-coverage [email or web address] Password [facility] 03/25/2015, 12:05 PM  LOS: 2 days

## 2015-03-25 NOTE — Progress Notes (Signed)
Pt arrived to 4N19 via wheelchair.  A/O X 4.  No c/o pain or distress noted.  Welcomed and oriented to the unit and the room.  Call bell within reach.  Wife at bedside.  Will continue to monitor. Cori Razor, RN
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
## 2015-03-25 NOTE — Progress Notes (Signed)
Pt transported to MRI accompanied by RN

## 2015-03-26 ENCOUNTER — Encounter (HOSPITAL_COMMUNITY): Payer: Self-pay | Admitting: Radiology

## 2015-03-26 DIAGNOSIS — R160 Hepatomegaly, not elsewhere classified: Secondary | ICD-10-CM | POA: Insufficient documentation

## 2015-03-26 LAB — GLUCOSE, CAPILLARY
GLUCOSE-CAPILLARY: 193 mg/dL — AB (ref 70–99)
GLUCOSE-CAPILLARY: 228 mg/dL — AB (ref 70–99)
Glucose-Capillary: 202 mg/dL — ABNORMAL HIGH (ref 70–99)
Glucose-Capillary: 228 mg/dL — ABNORMAL HIGH (ref 70–99)

## 2015-03-26 MED ORDER — INSULIN ASPART 100 UNIT/ML ~~LOC~~ SOLN
4.0000 [IU] | Freq: Three times a day (TID) | SUBCUTANEOUS | Status: DC
  Administered 2015-03-26 – 2015-03-28 (×4): 4 [IU] via SUBCUTANEOUS

## 2015-03-26 MED ORDER — INSULIN ASPART 100 UNIT/ML ~~LOC~~ SOLN
0.0000 [IU] | Freq: Three times a day (TID) | SUBCUTANEOUS | Status: DC
  Administered 2015-03-26: 3 [IU] via SUBCUTANEOUS
  Administered 2015-03-26 (×2): 5 [IU] via SUBCUTANEOUS
  Administered 2015-03-27: 8 [IU] via SUBCUTANEOUS
  Administered 2015-03-27 (×2): 3 [IU] via SUBCUTANEOUS
  Administered 2015-03-28: 5 [IU] via SUBCUTANEOUS

## 2015-03-26 NOTE — Progress Notes (Signed)
Patient ID: Walter Wilkins, male   DOB: 19-Oct-1956, 59 y.o.   MRN: [id number] Neuro stable. Mri shows a metastasis.w/u to look for primary lesion, resection can be done if advised by oncology

## 2015-03-26 NOTE — Progress Notes (Signed)
Subjective: Patient awake and alert.  No further seizure activity noted.  On Keppra.  Objective: Current vital signs: BP 110/53 mmHg  Pulse 53  Temp(Src) 97.7 F (36.5 C) (Oral)  Resp 16  Ht 6' (1.829 m)  Wt 110.6 kg (243 lb 13.3 oz)  BMI 33.06 kg/m2  SpO2 95% Vital signs in last 24 hours: Temp:  [97.7 F (36.5 C)-98.2 F (36.8 C)] 97.7 F (36.5 C) (04/03 0240) Pulse Rate:  [53-102] 53 (04/03 0240) Resp:  [16-23] 16 (04/03 0240) BP: (102-124)/(53-70) 110/53 mmHg (04/03 0240) SpO2:  [94 %-96 %] 95 % (04/03 0240)  Intake/Output from previous day: 04/02 0701 - 04/03 0700 In: 540 [P.O.:540] Out: 1300 [Urine:1300] Intake/Output this shift:   Nutritional status: Diet Carb Modified Fluid consistency:: Thin; Room service appropriate?: Yes Diet NPO time specified Except for: Sips with Meds  Neurologic Exam: Mental Status: Alert, oriented, thought content appropriate. Speech fluent without evidence of aphasia. Able to follow 3 step commands without difficulty. Cranial Nerves: II: Discs flat bilaterally; Visual fields grossly normal, pupils equal, round, reactive to light and accommodation III,IV, VI: ptosis not present, extra-ocular motions intact bilaterally V,VII: mild right facial droop, facial light touch sensation normal bilaterally VIII: hearing normal bilaterally IX,X: uvula rises symmetrically XI: bilateral shoulder shrug XII: midline tongue extension Motor: 5/5 throughout Sensory: Pinprick and light touch intact throughout, bilaterally Deep Tendon Reflexes: 2+ and symmetric throughout Plantars: Right: downgoingLeft: downgoing Cerebellar: normal finger-to-nose, normal heel-to-shin test   Lab Results: Basic Metabolic Panel:  Recent Labs Lab 03/23/15 1428 03/23/15 1449 03/23/15 2120 03/24/15 0318 03/25/15 0237  NA 139 138  --  137 134*  K 4.7 4.6  --  4.3 4.4  CL 101 102  --  103 100  CO2 20  --   --  29 27  GLUCOSE  181* 183*  --  126* 192*  BUN 14 20  --  13 14  CREATININE 1.27 1.30 0.91 0.93 0.96  CALCIUM 9.1  --   --  8.5 8.9  MG  --   --  1.7  --  2.1    Liver Function Tests:  Recent Labs Lab 03/24/15 0318  AST 28  ALT 22  ALKPHOS 135*  BILITOT 0.5  PROT 6.0  ALBUMIN 3.1*   No results for input(s): LIPASE, AMYLASE in the last 168 hours. No results for input(s): AMMONIA in the last 168 hours.  CBC:  Recent Labs Lab 03/23/15 1428 03/23/15 1449 03/23/15 2120 03/24/15 0318  WBC 10.1  --  8.5 9.0  HGB 15.5 17.3* 12.0* 13.9  HCT 46.2 51.0 36.0* 42.0  MCV 90.4  --  89.1 89.4  PLT 221  --  187 190    Cardiac Enzymes: No results for input(s): CKTOTAL, CKMB, CKMBINDEX, TROPONINI in the last 168 hours.  Lipid Panel:  Recent Labs Lab 03/24/15 0318  CHOL 136  TRIG 129  HDL 42  CHOLHDL 3.2  VLDL 26  LDLCALC 68    CBG:  Recent Labs Lab 03/25/15 1234 03/25/15 1625 03/25/15 2203 03/25/15 2336 03/26/15 0655  GLUCAP 132* 231* 407* 288* 193*    Microbiology: Results for orders placed or performed during the hospital encounter of 03/23/15  MRSA PCR Screening     Status: None   Collection Time: 03/23/15  4:17 PM  Result Value Ref Range Status   MRSA by PCR NEGATIVE NEGATIVE Final    Comment:        The GeneXpert MRSA Assay (FDA approved for  NASAL specimens only), is one component of a comprehensive MRSA colonization surveillance program. It is not intended to diagnose MRSA infection nor to guide or monitor treatment for MRSA infections.     Coagulation Studies:  Recent Labs  03/23/15 2120 03/24/15 0318  LABPROT 14.9 13.7  INR 1.16 1.04    Imaging: Ct Head Wo Contrast  03/24/2015   CLINICAL DATA:  Syncopal episode/ cardiac arrest yesterday.  EXAM: CT HEAD WITHOUT CONTRAST  TECHNIQUE: Contiguous axial images were obtained from the base of the skull through the vertex without intravenous contrast.  COMPARISON:  None.  FINDINGS: There is a 1.9 cm mildly  hyperattenuating cortical lesion in the high left frontal lobe at the skull vertex with minimal surrounding edema. The appearance is most consistent with a mass rather primary parenchymal hemorrhage. No mass, intracranial hemorrhage, or edema is identified elsewhere on this unenhanced study. There is no evidence of acute infarct, midline shift, or extra-axial fluid collection. Ventricles and sulci are normal.  Orbits are unremarkable. The visualized paranasal sinuses are clear. No significant mastoid effusion is seen. No destructive skull lesions are identified.  IMPRESSION: 1.9 cm high left frontal lobe mass with minimal surrounding edema, most consistent with a brain metastasis given findings on concurrent chest CT. Further evaluation with brain MRI (without and with contrast) is recommended.  These results were called by telephone at the time of interpretation on 03/24/2015 at 12:25 pm to PA [facility] , who verbally acknowledged these results.   Electronically Signed   By: Logan Bores   On: 03/24/2015 12:32   Ct Angio Chest Pe W/cm &/or Wo Cm  03/24/2015   CLINICAL DATA:  Cardiac arrest.  EXAM: CT ANGIOGRAPHY CHEST WITH CONTRAST  TECHNIQUE: Multidetector CT imaging of the chest was performed using the standard protocol during bolus administration of intravenous contrast. Multiplanar CT image reconstructions and MIPs were obtained to evaluate the vascular anatomy.  CONTRAST:  49mL OMNIPAQUE IOHEXOL 350 MG/ML SOLN  COMPARISON:  Chest x-ray dated 03/23/2015 and chest CT dated 02/25/2013  FINDINGS: There fractures of the left sixth seventh and eighth ribs anteriorly and of the right and of the right fourth through eighth ribs anteriorly secondary to recent CPR.  There are no pulmonary emboli. There is consolidation of most of the left lower lobe and there is a streaky interstitial infiltrate in the posterior lateral aspect of the left upper lobe. These findings probably represent aspiration pneumonitis. The right  lung is clear. There is 1 enlarged left hilar lymph node, 16 mm in diameter on image 113 of series 407.  Images of the upper abdomen demonstrate multiple poorly defined low-density lesions in the liver worrisome for metastatic disease. There is a 3.4 cm lesion in the right lobe on image 99 of series 401. There are least 2 lesions in the left left lobe and at least 7 lesions in the right lobe.  The patient has had gastric bypass surgery and cholecystectomy.  Review of the MIP images confirms the above findings.  IMPRESSION: 1. Multiple low-density lesions in the liver worrisome for metastatic disease. 2. No pulmonary emboli. 3. Consolidative infiltrate in the left lower lobe with interstitial infiltrate in the left upper lobe. I suspect both of these represent aspiration pneumonitis. 4. Single enlarged left hilar lymph node. 5. Multiple bilateral anterior rib fractures most likely secondary to recent CPR.   Electronically Signed   By: Lorriane Shire M.D.   On: 03/24/2015 11:51   Mr Jeri Cos IO Contrast  03/25/2015   CLINICAL DATA:  Seizure while at baseball game, patient does not recall event. No history of seizures. Suspected metastatic disease on recent imaging.  EXAM: MRI HEAD WITHOUT AND WITH CONTRAST  TECHNIQUE: Multiplanar, multiecho pulse sequences of the brain and surrounding structures were obtained without and with intravenous contrast.  CONTRAST:  2mL MULTIHANCE GADOBENATE DIMEGLUMINE 529 MG/ML IV SOLN  COMPARISON:  CT of the head March 24, 2015  FINDINGS: Focal susceptibility artifact and slight reduced diffusion associated with low low T1, low T2 homogeneously enhancing 17 x 15 mm LEFT cortical based frontal convexity mass with mild surrounding vasogenic edema. No additional parenchymal masses. LEFT frontal developmental venous anomaly.  No reduced diffusion to suggest acute ischemia. Ventricles and sulci are are overall normal for patient's age. A few scattered subcentimeter white matter FLAIR T2  hyperintensities may reflect chronic small vessel ischemic disease, within normal range for patient's age. No midline shift or significant mass effect. No abnormal extra-axial fluid collections nor leptomeningeal enhancement. Normal major intracranial vascular flow voids seen at the skull base.  Mild motion degraded coronal T2 with relatively symmetric appearance of the hippocampi with normal morphology and signal characteristics.  Ocular globes and orbital contents are unremarkable. Paranasal sinuses and mastoid air cells are well aerated. No abnormal sellar expansion. No cerebellar tonsillar ectopia. No suspicious calvarial bone marrow signal.  IMPRESSION: Solitary 17 x 15 mm hemorrhagic LEFT frontal lobe mass corresponding to CT abnormality with imaging characteristics of metastasis.  Otherwise unremarkable MRI of the brain with contrast for age.   Electronically Signed   By: Elon Alas   On: 03/25/2015 03:41   Ct Abdomen Pelvis W Contrast  03/25/2015   CLINICAL DATA:  LIVER LESION NOTED ON ANGIO CHEST FROM 03-24-15, DIABETIC, 15/1.51/ followup exam.  EXAM: CT ABDOMEN AND PELVIS WITH CONTRAST  TECHNIQUE: Multidetector CT imaging of the abdomen and pelvis was performed using the standard protocol following bolus administration of intravenous contrast.  CONTRAST:  182mL OMNIPAQUE IOHEXOL 300 MG/ML  SOLN  COMPARISON:  CTA chest, 03/24/2015.  PET-CT, 03/04/2013.  FINDINGS: There are multiple low-density liver masses. The margins of views are somewhat ill-defined. There is mild heterogeneity of the internal attenuation with reticular areas of apparent enhancement. These are not cysts. Largest mass arises from the anterior segment of the right lobe measuring 3.4 cm x 3.3 cm transversely. A mass measuring 2 cm from the inferior margin of the lateral segment of the left lobe bulges the inferior liver contour.  As noted on the recent chest CT, there is consolidation in the left lower lobe and at the posterior  inferior base of the left upper lobe. Volume loss in the left lower lobe with posterior deviation of the oblique fissure suggests that at least a component of this opacity is atelectasis. There is some central heterogeneous attenuating soft tissue lying anterior to the thoracic aorta and to the left of the esophagus. This may reflect adenopathy. It measures approximately 2.3 cm x 2.4 cm in size. There is a minimal left pleural effusion and trace amount of right pleural fluid. Milder dependent atelectasis is noted in the posterior right lower lobe.  There changes from previous gastric surgery. No gastric mass is seen.  A portion of the transverse colon enters a paraumbilical midline hernia. There is no evidence of obstruction, incarceration or strangulation. There are few colonic diverticula but no evidence of diverticulitis. Small bowel unremarkable.  Gallbladder surgically absent.  No bile duct dilation.  Spleen, pancreas, adrenal  glands, kidneys, ureters, bladder: Unremarkable.  No pathologically enlarged lymph nodes.  No ascites.  No osteoblastic or osteolytic lesions.  IMPRESSION: 1. Multiple low-density liver lesions highly suspicious for metastatic disease. Largest lesion lies in the right lobe measuring 3.4 cm in greatest transverse dimension. 2. No other evidence of metastatic disease below the diaphragm. No acute findings within the abdomen or pelvis. 3. Lung base findings that are unchanged from the previous day's CT angiogram. Consolidation/atelectasis in left lower lobe. Probable adenopathy in the posterior inferior left mediastinum. Prior PET-CT evaluated a small left lower lobe nodule. This nodule was at the left lung base. This nodule is not visualized currently--it may be obscured by the lung consolidation. Consider repeat follow-up PET-CT to evaluate the source of presumed metastatic disease to the liver.   Electronically Signed   By: Lajean Manes M.D.   On: 03/25/2015 12:30   Dg Knee Left  Port  03/24/2015   CLINICAL DATA:  Fall on concrete at baseball game yesterday. Landing on left knee. Unable to bear weight.  EXAM: PORTABLE LEFT KNEE - 1-2 VIEW  COMPARISON:  None.  FINDINGS: Small joint effusion. No acute bony abnormality. No fracture, subluxation or dislocation. Soft tissues are intact.  IMPRESSION: Small joint effusion.  No acute bony abnormality.   Electronically Signed   By: Rolm Baptise M.D.   On: 03/24/2015 10:15    Medications:  I have reviewed the patient's current medications. Scheduled: . atorvastatin  40 mg Oral q1800  . dexamethasone  4 mg Intravenous 4 times per day  . insulin aspart  0-15 Units Subcutaneous TID WC  . levETIRAcetam  500 mg Oral BID  . sertraline  100 mg Oral BID    Assessment/Plan: Patient without further seizures.  Stable on Keppra.  Recommendations: 1.  Continue Keppra at current dose 2.  No further neurologic intervention is recommended at this time.  If further questions arise, please call or page at that time.  Thank you for allowing neurology to participate in the care of this patient.    LOS: 3 days   Alexis Goodell, MD [facility] [phone] 03/26/2015  9:46 AM

## 2015-03-26 NOTE — Progress Notes (Signed)
PROGRESS NOTE  Walter Wilkins ESL:[id number] DOB: 1956-03-30 DOA: 03/23/2015 PCP: No primary care provider on file.   Admit HPI / Brief Narrative: 59 year old male with history of hypertension, diabetes, and obesity status post remote gastric bypass procedure who presented to [facility] after a witnessed syncopal event. There was some confusion at the scene as to whether the patient actually experienced a cardiac arrest and whether the AED delivered a shock. While in the ER patient returned to normal mentation and had normal hemodynamic findings. Initial EKG without ischemic findings. Cardiology admitted the patient for presumed cardiac syncopal event.   Since admission the patient has undergone cardiac catheterization which revealed no angiographic evidence of CAD and normal systolic function. A d-dimer was elevated at 3.99. CT angiogram of the chest revealed no evidence of pulmonary embolism. There were findings on the left lung concerning for aspiration pneumonitis. More concerning was a finding of multiple low-density lesions in the liver worrisome for metastatic disease. Neurology was consulted. Due to concerns of an underlying brain lesion precipitating seizure activity CT of the head was obtained. This revealed a 1.9 cm high left frontal lobe mass with minimal surrounding edema most consistent with brain metastasis. Radiologist recommended MRI of the brain with and without contrast. Because of all of these new findings [facility] assumed care of the pt on 03/24/2015.   Assessment/Plan:  Left frontal lobe mass w/ hemorrhage -w/ surrounding edema tx w/ Decadron -Neurology has started Keppra-->continue -MRI notes 17x43mm hemorrhagic L frontal lobe mass w/ no other significant findings  -Neurosurgery has been consulted and is following  -DECADRON DOSING AND DURATION per Neurosurgery recommendations  Liver lesions -Appears consistent with metastatic disease but no primary elucidated at this  juncture -CT abdomen and pelvis confirms liver lesions, but does not reveal evidence of a possible primary lesion  -Does have tiny node in left upper outer quadrant of breast - mother had breast CA  -will plan to pursue biopsy of liver lesions with the assistance of interventional radiology -Patient reports had colonoscopy 2 years ago with Dr. [place] in [facility] and it was "clean" - has had polyps removed via colo in prior years  -pt admits to unintentional weight loss of late, but can't quantify -03/26/15--case discussed with Med Onc, Dr. Zollie Pee need pathology before able to make any further diagnostic and therapeutic decisions-->follow up in cancer center after d/c   Syncope and collapse -Based on current evaluation noncardiac in etiology and not related to PE -Suspect patient had seizure  -Cardiac catheterization 03/23/2015--nonobstructive coronaries   Diabetes mellitus type 2  -03/23/2015 hemoglobin A1c 7.2 -CBGs elevated due to steroids -Add pre-meal NovoLog -change to moderate scale  Aspiration pneumonitis LLL and LUL  -Noted on left lung with consolidating features but patient without fever or leukocytosis -No indication to begin antibiotics at this juncture--patient remains afebrile and hemodynamically stable without any hypoxemia -prior lung nodule (for which he had remote PET) was in the LLL and therefore likely presently obscured by his pneumonitis   Multiple rib fractures -As above -Focus on pain management and pulmonary toileting with flutter valve  HTN  -Blood pressure well controlled at present   History of gastric bypass -Reporting issues related to early satiety - may need EGD if liver lesions not diagnostic   Chronic diastolic heart failure -Findings on 2-D echocardiogram this admission -Currently compensated  Reactive depression -Continue Zoloft and Xanax  Obesity - Body mass index is 33.06 kg/(m^2).  Code Status: FULL Family  Communication: spoke w/ wife and brother at bedside  Disposition Plan: schedule for IR liver bx 03/27/15  Consultants: Cards Neurology  Neurosurgery   Antibiotics: none  DVT prophylaxis: SCDs   Procedures/Studies: Ct Head Wo Contrast  03/24/2015   CLINICAL DATA:  Syncopal episode/ cardiac arrest yesterday.  EXAM: CT HEAD WITHOUT CONTRAST  TECHNIQUE: Contiguous axial images were obtained from the base of the skull through the vertex without intravenous contrast.  COMPARISON:  None.  FINDINGS: There is a 1.9 cm mildly hyperattenuating cortical lesion in the high left frontal lobe at the skull vertex with minimal surrounding edema. The appearance is most consistent with a mass rather primary parenchymal hemorrhage. No mass, intracranial hemorrhage, or edema is identified elsewhere on this unenhanced study. There is no evidence of acute infarct, midline shift, or extra-axial fluid collection. Ventricles and sulci are normal.  Orbits are unremarkable. The visualized paranasal sinuses are clear. No significant mastoid effusion is seen. No destructive skull lesions are identified.  IMPRESSION: 1.9 cm high left frontal lobe mass with minimal surrounding edema, most consistent with a brain metastasis given findings on concurrent chest CT. Further evaluation with brain MRI (without and with contrast) is recommended.  These results were called by telephone at the time of interpretation on 03/24/2015 at 12:25 pm to PA [facility] , who verbally acknowledged these results.   Electronically Signed   By: Logan Bores   On: 03/24/2015 12:32   Ct Angio Chest Pe W/cm &/or Wo Cm  03/24/2015   CLINICAL DATA:  Cardiac arrest.  EXAM: CT ANGIOGRAPHY CHEST WITH CONTRAST  TECHNIQUE: Multidetector CT imaging of the chest was performed using the standard protocol during bolus administration of intravenous contrast. Multiplanar CT image reconstructions and MIPs were obtained to evaluate the vascular anatomy.  CONTRAST:  74mL  OMNIPAQUE IOHEXOL 350 MG/ML SOLN  COMPARISON:  Chest x-ray dated 03/23/2015 and chest CT dated 02/25/2013  FINDINGS: There fractures of the left sixth seventh and eighth ribs anteriorly and of the right and of the right fourth through eighth ribs anteriorly secondary to recent CPR.  There are no pulmonary emboli. There is consolidation of most of the left lower lobe and there is a streaky interstitial infiltrate in the posterior lateral aspect of the left upper lobe. These findings probably represent aspiration pneumonitis. The right lung is clear. There is 1 enlarged left hilar lymph node, 16 mm in diameter on image 113 of series 407.  Images of the upper abdomen demonstrate multiple poorly defined low-density lesions in the liver worrisome for metastatic disease. There is a 3.4 cm lesion in the right lobe on image 99 of series 401. There are least 2 lesions in the left left lobe and at least 7 lesions in the right lobe.  The patient has had gastric bypass surgery and cholecystectomy.  Review of the MIP images confirms the above findings.  IMPRESSION: 1. Multiple low-density lesions in the liver worrisome for metastatic disease. 2. No pulmonary emboli. 3. Consolidative infiltrate in the left lower lobe with interstitial infiltrate in the left upper lobe. I suspect both of these represent aspiration pneumonitis. 4. Single enlarged left hilar lymph node. 5. Multiple bilateral anterior rib fractures most likely secondary to recent CPR.   Electronically Signed   By: Lorriane Shire M.D.   On: 03/24/2015 11:51   Mr Jeri Cos BO Contrast  03/25/2015   CLINICAL DATA:  Seizure while at baseball game, patient does not recall event. No  history of seizures. Suspected metastatic disease on recent imaging.  EXAM: MRI HEAD WITHOUT AND WITH CONTRAST  TECHNIQUE: Multiplanar, multiecho pulse sequences of the brain and surrounding structures were obtained without and with intravenous contrast.  CONTRAST:  34mL MULTIHANCE GADOBENATE  DIMEGLUMINE 529 MG/ML IV SOLN  COMPARISON:  CT of the head March 24, 2015  FINDINGS: Focal susceptibility artifact and slight reduced diffusion associated with low low T1, low T2 homogeneously enhancing 17 x 15 mm LEFT cortical based frontal convexity mass with mild surrounding vasogenic edema. No additional parenchymal masses. LEFT frontal developmental venous anomaly.  No reduced diffusion to suggest acute ischemia. Ventricles and sulci are are overall normal for patient's age. A few scattered subcentimeter white matter FLAIR T2 hyperintensities may reflect chronic small vessel ischemic disease, within normal range for patient's age. No midline shift or significant mass effect. No abnormal extra-axial fluid collections nor leptomeningeal enhancement. Normal major intracranial vascular flow voids seen at the skull base.  Mild motion degraded coronal T2 with relatively symmetric appearance of the hippocampi with normal morphology and signal characteristics.  Ocular globes and orbital contents are unremarkable. Paranasal sinuses and mastoid air cells are well aerated. No abnormal sellar expansion. No cerebellar tonsillar ectopia. No suspicious calvarial bone marrow signal.  IMPRESSION: Solitary 17 x 15 mm hemorrhagic LEFT frontal lobe mass corresponding to CT abnormality with imaging characteristics of metastasis.  Otherwise unremarkable MRI of the brain with contrast for age.   Electronically Signed   By: Elon Alas   On: 03/25/2015 03:41   Ct Abdomen Pelvis W Contrast  03/25/2015   CLINICAL DATA:  LIVER LESION NOTED ON ANGIO CHEST FROM 03-24-15, DIABETIC, 15/1.51/ followup exam.  EXAM: CT ABDOMEN AND PELVIS WITH CONTRAST  TECHNIQUE: Multidetector CT imaging of the abdomen and pelvis was performed using the standard protocol following bolus administration of intravenous contrast.  CONTRAST:  133mL OMNIPAQUE IOHEXOL 300 MG/ML  SOLN  COMPARISON:  CTA chest, 03/24/2015.  PET-CT, 03/04/2013.  FINDINGS: There are  multiple low-density liver masses. The margins of views are somewhat ill-defined. There is mild heterogeneity of the internal attenuation with reticular areas of apparent enhancement. These are not cysts. Largest mass arises from the anterior segment of the right lobe measuring 3.4 cm x 3.3 cm transversely. A mass measuring 2 cm from the inferior margin of the lateral segment of the left lobe bulges the inferior liver contour.  As noted on the recent chest CT, there is consolidation in the left lower lobe and at the posterior inferior base of the left upper lobe. Volume loss in the left lower lobe with posterior deviation of the oblique fissure suggests that at least a component of this opacity is atelectasis. There is some central heterogeneous attenuating soft tissue lying anterior to the thoracic aorta and to the left of the esophagus. This may reflect adenopathy. It measures approximately 2.3 cm x 2.4 cm in size. There is a minimal left pleural effusion and trace amount of right pleural fluid. Milder dependent atelectasis is noted in the posterior right lower lobe.  There changes from previous gastric surgery. No gastric mass is seen.  A portion of the transverse colon enters a paraumbilical midline hernia. There is no evidence of obstruction, incarceration or strangulation. There are few colonic diverticula but no evidence of diverticulitis. Small bowel unremarkable.  Gallbladder surgically absent.  No bile duct dilation.  Spleen, pancreas, adrenal glands, kidneys, ureters, bladder: Unremarkable.  No pathologically enlarged lymph nodes.  No ascites.  No osteoblastic  or osteolytic lesions.  IMPRESSION: 1. Multiple low-density liver lesions highly suspicious for metastatic disease. Largest lesion lies in the right lobe measuring 3.4 cm in greatest transverse dimension. 2. No other evidence of metastatic disease below the diaphragm. No acute findings within the abdomen or pelvis. 3. Lung base findings that are  unchanged from the previous day's CT angiogram. Consolidation/atelectasis in left lower lobe. Probable adenopathy in the posterior inferior left mediastinum. Prior PET-CT evaluated a small left lower lobe nodule. This nodule was at the left lung base. This nodule is not visualized currently--it may be obscured by the lung consolidation. Consider repeat follow-up PET-CT to evaluate the source of presumed metastatic disease to the liver.   Electronically Signed   By: Lajean Manes M.D.   On: 03/25/2015 12:30   Dg Chest Portable 1 View  03/23/2015   CLINICAL DATA:  Syncope at baseball game with CPR.  EXAM: PORTABLE CHEST - 1 VIEW  COMPARISON:  None.  FINDINGS: There is airspace opacity throughout the left lung. Heart size and upper mediastinal contours are within normal limits for technique. No evidence of effusion or air leak. No evidence of rib fracture post CPR.  IMPRESSION: Airspace disease throughout the left lung, likely aspiration given the history. CPR related lung contusion considered unlikely given no visible rib fracture.   Electronically Signed   By: Monte Fantasia M.D.   On: 03/23/2015 15:03   Dg Knee Left Port  03/24/2015   CLINICAL DATA:  Fall on concrete at baseball game yesterday. Landing on left knee. Unable to bear weight.  EXAM: PORTABLE LEFT KNEE - 1-2 VIEW  COMPARISON:  None.  FINDINGS: Small joint effusion. No acute bony abnormality. No fracture, subluxation or dislocation. Soft tissues are intact.  IMPRESSION: Small joint effusion.  No acute bony abnormality.   Electronically Signed   By: Rolm Baptise M.D.   On: 03/24/2015 10:15         Subjective: Patient denies fevers, chills, headache, chest pain, dyspnea, nausea, vomiting, diarrhea, abdominal pain, dysuria, hematuria   Objective: Filed Vitals:   03/25/15 1811 03/25/15 2158 03/26/15 0240 03/26/15 0945  BP: 112/64 106/58 110/53 120/73  Pulse: 64 76 53 59  Temp: 98.1 F (36.7 C) 97.9 F (36.6 C) 97.7 F (36.5 C) 98 F  (36.7 C)  TempSrc: Oral Oral Oral Oral  Resp: 20 18 16 18   Height:      Weight:      SpO2: 95% 95% 95% 95%    Intake/Output Summary (Last 24 hours) at 03/26/15 1205 Last data filed at 03/26/15 0700  Gross per 24 hour  Intake    540 ml  Output    400 ml  Net    140 ml   Weight change:  Exam:   General:  Pt is alert, follows commands appropriately, not in acute distress  HEENT: No icterus, No thrush,[place]/AT  Cardiovascular: RRR, S1/S2, no rubs, no gallops  Respiratory: Diminished breath sounds at the bases, left greater than right. No wheezing.  Abdomen: Soft/+BS, non tender, non distended, no guarding  Extremities: No edema, No lymphangitis, No petechiae, No rashes, no synovitis  Data Reviewed: Basic Metabolic Panel:  Recent Labs Lab 03/23/15 1428 03/23/15 1449 03/23/15 2120 03/24/15 0318 03/25/15 0237  NA 139 138  --  137 134*  K 4.7 4.6  --  4.3 4.4  CL 101 102  --  103 100  CO2 20  --   --  29 27  GLUCOSE 181* 183*  --  126* 192*  BUN 14 20  --  13 14  CREATININE 1.27 1.30 0.91 0.93 0.96  CALCIUM 9.1  --   --  8.5 8.9  MG  --   --  1.7  --  2.1   Liver Function Tests:  Recent Labs Lab 03/24/15 0318  AST 28  ALT 22  ALKPHOS 135*  BILITOT 0.5  PROT 6.0  ALBUMIN 3.1*   No results for input(s): LIPASE, AMYLASE in the last 168 hours. No results for input(s): AMMONIA in the last 168 hours. CBC:  Recent Labs Lab 03/23/15 1428 03/23/15 1449 03/23/15 2120 03/24/15 0318  WBC 10.1  --  8.5 9.0  HGB 15.5 17.3* 12.0* 13.9  HCT 46.2 51.0 36.0* 42.0  MCV 90.4  --  89.1 89.4  PLT 221  --  187 190   Cardiac Enzymes: No results for input(s): CKTOTAL, CKMB, CKMBINDEX, TROPONINI in the last 168 hours. BNP: Invalid input(s): POCBNP CBG:  Recent Labs Lab 03/25/15 1625 03/25/15 2203 03/25/15 2336 03/26/15 0655 03/26/15 1032  GLUCAP 231* 407* 288* 193* 202*    Recent Results (from the past 240 hour(s))  MRSA PCR Screening     Status: None    Collection Time: 03/23/15  4:17 PM  Result Value Ref Range Status   MRSA by PCR NEGATIVE NEGATIVE Final    Comment:        The GeneXpert MRSA Assay (FDA approved for NASAL specimens only), is one component of a comprehensive MRSA colonization surveillance program. It is not intended to diagnose MRSA infection nor to guide or monitor treatment for MRSA infections.      Scheduled Meds: . atorvastatin  40 mg Oral q1800  . dexamethasone  4 mg Intravenous 4 times per day  . insulin aspart  0-15 Units Subcutaneous TID WC  . levETIRAcetam  500 mg Oral BID  . sertraline  100 mg Oral BID   Continuous Infusions:    Jasey Cortez, DO  [facility] Pager [phone]  If 7PM-7AM, please contact night-coverage [email or web address] Password [email or web address] 03/26/2015, 12:05 PM   LOS: 3 days

## 2015-03-26 NOTE — H&P (Signed)
Reason for Consult: Liver lesions, brain mass Chief Complaint: Chief Complaint  Patient presents with  . Loss of Consciousness   Referring Physician(s): TRH  History of Present Illness: Walter Wilkins is a 59 y.o. male who presented after syncopal episode with seizure like activity and suspected Cardiac arrest s/p CPR. The patient underwent a cardiac catheterization with no angiographic evidence of CAD. A CT head revealed left frontal lobe mass with surrounding edema, MRI brain revealed hemorrhagic left frontal lobe mass and CT abdomen/pelvis revealed multiple liver lesions. IR received request for liver lesion biopsy. He denies any chest pain, shortness of breath or palpitations. He denies any active signs of bleeding or excessive bruising. He denies any recent fever or chills. The patient does admit to OSA, but does not use a CPAP. He has previously tolerated sedation without complications.    Past Medical History  Diagnosis Date  . Diabetes mellitus without complication   . H/O gastric bypass     a. ~2011 at [facility].  . Acid reflux     Past Surgical History  Procedure Laterality Date  . Left heart catheterization with coronary angiogram N/A 03/23/2015    Procedure: LEFT HEART CATHETERIZATION WITH CORONARY ANGIOGRAM;  Surgeon: Burnell Blanks, MD;  Location: [facility] CATH LAB;  Service: Cardiovascular;  Laterality: N/A;  . Gastric bypass  2011    Allergies: Review of patient's allergies indicates no known allergies.  Medications: Prior to Admission medications   Medication Sig Start Date End Date Taking? Authorizing Provider  ALPRAZolam (XANAX) 0.25 MG tablet Take 0.25 mg by mouth 2 (two) times daily as needed for anxiety. Last Refill 08/22/2014   Yes Historical Provider, MD  aspirin EC 81 MG tablet Take 81 mg by mouth daily.   Yes Historical Provider, MD  atorvastatin (LIPITOR) 40 MG tablet Take 40 mg by mouth daily. Last Refill 08/22/2014   Yes Historical Provider,  MD  lisinopril (PRINIVIL,ZESTRIL) 5 MG tablet Take 5 mg by mouth daily. Last Refill 08/10/2014   Yes Historical Provider, MD  metFORMIN (GLUCOPHAGE) 1000 MG tablet Take 1,000 mg by mouth 2 (two) times daily with a meal. Last refill 08/23/2014   Yes Historical Provider, MD  omeprazole (PRILOSEC) 20 MG capsule Take 20 mg by mouth daily. Last Refill 08/22/2014   Yes Historical Provider, MD  sertraline (ZOLOFT) 100 MG tablet Take 100 mg by mouth 2 (two) times daily. Last refill 08/22/2014   Yes Historical Provider, MD     Family History  Problem Relation Age of Onset  . Valvular heart disease Father     H/o pig valve  . Sudden death Neg Hx     No family history of sudden cardiac death    History   Social History  . Marital Status: Unknown    Spouse Name: N/A  . Number of Children: N/A  . Years of Education: N/A   Social History Main Topics  . Smoking status: Never Smoker   . Smokeless tobacco: Not on file  . Alcohol Use: 0.0 oz/week    0 Standard drinks or equivalent per week     Comment: Occasionally socially - once a week or less  . Drug Use: No  . Sexual Activity: Not on file   Other Topics Concern  . None   Social History Narrative    Review of Systems: A 12 point ROS discussed and pertinent positives are indicated in the HPI above.  All other systems are negative.  Review of  Systems  Vital Signs: BP 120/73 mmHg  Pulse 59  Temp(Src) 98 F (36.7 C) (Oral)  Resp 18  Ht 6' (1.829 m)  Wt 243 lb 13.3 oz (110.6 kg)  BMI 33.06 kg/m2  SpO2 95%  Physical Exam  Constitutional: He is oriented to person, place, and time. No distress.  HENT:  Head: Normocephalic and atraumatic.  Neck: No tracheal deviation present.  Cardiovascular: Normal rate and regular rhythm.  Exam reveals no gallop and no friction rub.   No murmur heard. Pulmonary/Chest: Effort normal and breath sounds normal. No respiratory distress. He has no wheezes. He has no rales.  Abdominal: Soft. Bowel sounds  are normal. He exhibits no distension. There is no tenderness.  Neurological: He is alert and oriented to person, place, and time.  Skin: He is not diaphoretic.  Psychiatric: He has a normal mood and affect. His behavior is normal. Thought content normal.    Mallampati Score:  MD Evaluation Airway: WNL Heart: WNL Abdomen: WNL Chest/ Lungs: WNL ASA  Classification: 3 Mallampati/Airway Score: Two  Imaging: Ct Head Wo Contrast  03/24/2015   CLINICAL DATA:  Syncopal episode/ cardiac arrest yesterday.  EXAM: CT HEAD WITHOUT CONTRAST  TECHNIQUE: Contiguous axial images were obtained from the base of the skull through the vertex without intravenous contrast.  COMPARISON:  None.  FINDINGS: There is a 1.9 cm mildly hyperattenuating cortical lesion in the high left frontal lobe at the skull vertex with minimal surrounding edema. The appearance is most consistent with a mass rather primary parenchymal hemorrhage. No mass, intracranial hemorrhage, or edema is identified elsewhere on this unenhanced study. There is no evidence of acute infarct, midline shift, or extra-axial fluid collection. Ventricles and sulci are normal.  Orbits are unremarkable. The visualized paranasal sinuses are clear. No significant mastoid effusion is seen. No destructive skull lesions are identified.  IMPRESSION: 1.9 cm high left frontal lobe mass with minimal surrounding edema, most consistent with a brain metastasis given findings on concurrent chest CT. Further evaluation with brain MRI (without and with contrast) is recommended.  These results were called by telephone at the time of interpretation on 03/24/2015 at 12:25 pm to PA [facility] , who verbally acknowledged these results.   Electronically Signed   By: Logan Bores   On: 03/24/2015 12:32   Ct Angio Chest Pe W/cm &/or Wo Cm  03/24/2015   CLINICAL DATA:  Cardiac arrest.  EXAM: CT ANGIOGRAPHY CHEST WITH CONTRAST  TECHNIQUE: Multidetector CT imaging of the chest was performed  using the standard protocol during bolus administration of intravenous contrast. Multiplanar CT image reconstructions and MIPs were obtained to evaluate the vascular anatomy.  CONTRAST:  61mL OMNIPAQUE IOHEXOL 350 MG/ML SOLN  COMPARISON:  Chest x-ray dated 03/23/2015 and chest CT dated 02/25/2013  FINDINGS: There fractures of the left sixth seventh and eighth ribs anteriorly and of the right and of the right fourth through eighth ribs anteriorly secondary to recent CPR.  There are no pulmonary emboli. There is consolidation of most of the left lower lobe and there is a streaky interstitial infiltrate in the posterior lateral aspect of the left upper lobe. These findings probably represent aspiration pneumonitis. The right lung is clear. There is 1 enlarged left hilar lymph node, 16 mm in diameter on image 113 of series 407.  Images of the upper abdomen demonstrate multiple poorly defined low-density lesions in the liver worrisome for metastatic disease. There is a 3.4 cm lesion in the right lobe on image  99 of series 401. There are least 2 lesions in the left left lobe and at least 7 lesions in the right lobe.  The patient has had gastric bypass surgery and cholecystectomy.  Review of the MIP images confirms the above findings.  IMPRESSION: 1. Multiple low-density lesions in the liver worrisome for metastatic disease. 2. No pulmonary emboli. 3. Consolidative infiltrate in the left lower lobe with interstitial infiltrate in the left upper lobe. I suspect both of these represent aspiration pneumonitis. 4. Single enlarged left hilar lymph node. 5. Multiple bilateral anterior rib fractures most likely secondary to recent CPR.   Electronically Signed   By: Lorriane Shire M.D.   On: 03/24/2015 11:51   Mr Jeri Cos DJ Contrast  03/25/2015   CLINICAL DATA:  Seizure while at baseball game, patient does not recall event. No history of seizures. Suspected metastatic disease on recent imaging.  EXAM: MRI HEAD WITHOUT AND WITH  CONTRAST  TECHNIQUE: Multiplanar, multiecho pulse sequences of the brain and surrounding structures were obtained without and with intravenous contrast.  CONTRAST:  68mL MULTIHANCE GADOBENATE DIMEGLUMINE 529 MG/ML IV SOLN  COMPARISON:  CT of the head March 24, 2015  FINDINGS: Focal susceptibility artifact and slight reduced diffusion associated with low low T1, low T2 homogeneously enhancing 17 x 15 mm LEFT cortical based frontal convexity mass with mild surrounding vasogenic edema. No additional parenchymal masses. LEFT frontal developmental venous anomaly.  No reduced diffusion to suggest acute ischemia. Ventricles and sulci are are overall normal for patient's age. A few scattered subcentimeter white matter FLAIR T2 hyperintensities may reflect chronic small vessel ischemic disease, within normal range for patient's age. No midline shift or significant mass effect. No abnormal extra-axial fluid collections nor leptomeningeal enhancement. Normal major intracranial vascular flow voids seen at the skull base.  Mild motion degraded coronal T2 with relatively symmetric appearance of the hippocampi with normal morphology and signal characteristics.  Ocular globes and orbital contents are unremarkable. Paranasal sinuses and mastoid air cells are well aerated. No abnormal sellar expansion. No cerebellar tonsillar ectopia. No suspicious calvarial bone marrow signal.  IMPRESSION: Solitary 17 x 15 mm hemorrhagic LEFT frontal lobe mass corresponding to CT abnormality with imaging characteristics of metastasis.  Otherwise unremarkable MRI of the brain with contrast for age.   Electronically Signed   By: Elon Alas   On: 03/25/2015 03:41   Ct Abdomen Pelvis W Contrast  03/25/2015   CLINICAL DATA:  LIVER LESION NOTED ON ANGIO CHEST FROM 03-24-15, DIABETIC, 15/1.51/ followup exam.  EXAM: CT ABDOMEN AND PELVIS WITH CONTRAST  TECHNIQUE: Multidetector CT imaging of the abdomen and pelvis was performed using the standard  protocol following bolus administration of intravenous contrast.  CONTRAST:  167mL OMNIPAQUE IOHEXOL 300 MG/ML  SOLN  COMPARISON:  CTA chest, 03/24/2015.  PET-CT, 03/04/2013.  FINDINGS: There are multiple low-density liver masses. The margins of views are somewhat ill-defined. There is mild heterogeneity of the internal attenuation with reticular areas of apparent enhancement. These are not cysts. Largest mass arises from the anterior segment of the right lobe measuring 3.4 cm x 3.3 cm transversely. A mass measuring 2 cm from the inferior margin of the lateral segment of the left lobe bulges the inferior liver contour.  As noted on the recent chest CT, there is consolidation in the left lower lobe and at the posterior inferior base of the left upper lobe. Volume loss in the left lower lobe with posterior deviation of the oblique fissure suggests that at  least a component of this opacity is atelectasis. There is some central heterogeneous attenuating soft tissue lying anterior to the thoracic aorta and to the left of the esophagus. This may reflect adenopathy. It measures approximately 2.3 cm x 2.4 cm in size. There is a minimal left pleural effusion and trace amount of right pleural fluid. Milder dependent atelectasis is noted in the posterior right lower lobe.  There changes from previous gastric surgery. No gastric mass is seen.  A portion of the transverse colon enters a paraumbilical midline hernia. There is no evidence of obstruction, incarceration or strangulation. There are few colonic diverticula but no evidence of diverticulitis. Small bowel unremarkable.  Gallbladder surgically absent.  No bile duct dilation.  Spleen, pancreas, adrenal glands, kidneys, ureters, bladder: Unremarkable.  No pathologically enlarged lymph nodes.  No ascites.  No osteoblastic or osteolytic lesions.  IMPRESSION: 1. Multiple low-density liver lesions highly suspicious for metastatic disease. Largest lesion lies in the right lobe  measuring 3.4 cm in greatest transverse dimension. 2. No other evidence of metastatic disease below the diaphragm. No acute findings within the abdomen or pelvis. 3. Lung base findings that are unchanged from the previous day's CT angiogram. Consolidation/atelectasis in left lower lobe. Probable adenopathy in the posterior inferior left mediastinum. Prior PET-CT evaluated a small left lower lobe nodule. This nodule was at the left lung base. This nodule is not visualized currently--it may be obscured by the lung consolidation. Consider repeat follow-up PET-CT to evaluate the source of presumed metastatic disease to the liver.   Electronically Signed   By: Lajean Manes M.D.   On: 03/25/2015 12:30   Dg Chest Portable 1 View  03/23/2015   CLINICAL DATA:  Syncope at baseball game with CPR.  EXAM: PORTABLE CHEST - 1 VIEW  COMPARISON:  None.  FINDINGS: There is airspace opacity throughout the left lung. Heart size and upper mediastinal contours are within normal limits for technique. No evidence of effusion or air leak. No evidence of rib fracture post CPR.  IMPRESSION: Airspace disease throughout the left lung, likely aspiration given the history. CPR related lung contusion considered unlikely given no visible rib fracture.   Electronically Signed   By: Monte Fantasia M.D.   On: 03/23/2015 15:03   Dg Knee Left Port  03/24/2015   CLINICAL DATA:  Fall on concrete at baseball game yesterday. Landing on left knee. Unable to bear weight.  EXAM: PORTABLE LEFT KNEE - 1-2 VIEW  COMPARISON:  None.  FINDINGS: Small joint effusion. No acute bony abnormality. No fracture, subluxation or dislocation. Soft tissues are intact.  IMPRESSION: Small joint effusion.  No acute bony abnormality.   Electronically Signed   By: Rolm Baptise M.D.   On: 03/24/2015 10:15    Labs:  CBC:  Recent Labs  03/23/15 1428 03/23/15 1449 03/23/15 2120 03/24/15 0318  WBC 10.1  --  8.5 9.0  HGB 15.5 17.3* 12.0* 13.9  HCT 46.2 51.0 36.0*  42.0  PLT 221  --  187 190    COAGS:  Recent Labs  03/23/15 2120 03/24/15 0318  INR 1.16 1.04    BMP:  Recent Labs  03/23/15 1428 03/23/15 1449 03/23/15 2120 03/24/15 0318 03/25/15 0237  NA 139 138  --  137 134*  K 4.7 4.6  --  4.3 4.4  CL 101 102  --  103 100  CO2 20  --   --  29 27  GLUCOSE 181* 183*  --  126* 192*  BUN  14 20  --  13 14  CALCIUM 9.1  --   --  8.5 8.9  CREATININE 1.27 1.30 0.91 0.93 0.96  GFRNONAA 60*  --  >90 89* 88*  GFRAA 69*  --  >90 >90 >90    LIVER FUNCTION TESTS:  Recent Labs  03/24/15 0318  BILITOT 0.5  AST 28  ALT 22  ALKPHOS 135*  PROT 6.0  ALBUMIN 3.1*   Assessment and Plan: Syncope with seizure like activity-Neurology on board, on Keppra and Decadron Suspected Cardiac arrest s/p CPR/AED no shock delivered-resolved S/p cardiac catheterization with no angiographic evidence of CAD CT head revealed left frontal lobe mass with surrounding edema MRI brain revealed hemorrhagic left frontal lobe mass CT abdomen/pelvis revealed multiple liver lesions, Dr. Annamaria Boots has reviewed the images  Request for liver lesion biopsy  The patient will be NPO after midnight, no blood thinners given, labs and vitals have been reviewed. Risks and Benefits discussed with the patient including, but not limited to bleeding, infection, damage to adjacent structures or low yield requiring additional tests. All of the patient's questions were answered, patient is agreeable to proceed. Consent signed and in chart.    Thank you for this interesting consult.  I greatly enjoyed meeting Walter Wilkins and look forward to participating in their care.  SignedHedy Jacob 03/26/2015, 10:16 AM   I spent a total of 40 Minutes in face to face in clinical consultation, greater than 50% of which was counseling/coordinating care for liver lesions.

## 2015-03-27 ENCOUNTER — Encounter: Payer: Self-pay | Admitting: Radiation Therapy

## 2015-03-27 ENCOUNTER — Telehealth: Payer: Self-pay | Admitting: *Deleted

## 2015-03-27 ENCOUNTER — Inpatient Hospital Stay (HOSPITAL_COMMUNITY): Payer: Commercial Managed Care - PPO

## 2015-03-27 ENCOUNTER — Other Ambulatory Visit: Payer: Self-pay | Admitting: *Deleted

## 2015-03-27 DIAGNOSIS — G9389 Other specified disorders of brain: Secondary | ICD-10-CM

## 2015-03-27 HISTORY — PX: LIVER BIOPSY: SHX301

## 2015-03-27 LAB — GLUCOSE, CAPILLARY
Glucose-Capillary: 195 mg/dL — ABNORMAL HIGH (ref 70–99)
Glucose-Capillary: 153 mg/dL — ABNORMAL HIGH (ref 70–99)
Glucose-Capillary: 280 mg/dL — ABNORMAL HIGH (ref 70–99)
Glucose-Capillary: 267 mg/dL — ABNORMAL HIGH (ref 70–99)

## 2015-03-27 LAB — BASIC METABOLIC PANEL
ANION GAP: 9 (ref 5–15)
BUN: 22 mg/dL (ref 6–23)
CALCIUM: 8.8 mg/dL (ref 8.4–10.5)
CHLORIDE: 100 mmol/L (ref 96–112)
CO2: 28 mmol/L (ref 19–32)
Creatinine, Ser: 0.99 mg/dL (ref 0.50–1.35)
GFR calc non Af Amer: 87 mL/min — ABNORMAL LOW (ref >90)
Glucose, Bld: 220 mg/dL — ABNORMAL HIGH (ref 70–99)
Potassium: 4.5 mmol/L (ref 3.5–5.1)
Sodium: 137 mmol/L (ref 135–145)

## 2015-03-27 LAB — CBC
HCT: 42.3 % (ref 39.0–52.0)
HEMOGLOBIN: 14.2 g/dL (ref 13.0–17.0)
MCH: 29.6 pg (ref 26.0–34.0)
MCHC: 33.6 g/dL (ref 30.0–36.0)
MCV: 88.3 fL (ref 78.0–100.0)
Platelets: 229 10*3/uL (ref 150–400)
RBC: 4.79 MIL/uL (ref 4.22–5.81)
RDW: 13.2 % (ref 11.5–15.5)
WBC: 13.1 10*3/uL — ABNORMAL HIGH (ref 4.0–10.5)

## 2015-03-27 MED ORDER — GELATIN ABSORBABLE 12-7 MM EX MISC
CUTANEOUS | Status: AC
  Filled 2015-03-27: qty 1

## 2015-03-27 MED ORDER — FENTANYL CITRATE 0.05 MG/ML IJ SOLN
INTRAMUSCULAR | Status: AC | PRN
  Administered 2015-03-27: 50 ug via INTRAVENOUS
  Administered 2015-03-27: 25 ug via INTRAVENOUS

## 2015-03-27 MED ORDER — LIDOCAINE HCL (PF) 1 % IJ SOLN
INTRAMUSCULAR | Status: AC
  Filled 2015-03-27: qty 5

## 2015-03-27 MED ORDER — MIDAZOLAM HCL 2 MG/2ML IJ SOLN
INTRAMUSCULAR | Status: AC
  Filled 2015-03-27: qty 2

## 2015-03-27 MED ORDER — LIDOCAINE HCL (PF) 1 % IJ SOLN
INTRAMUSCULAR | Status: AC
  Filled 2015-03-27: qty 10

## 2015-03-27 MED ORDER — FENTANYL CITRATE 0.05 MG/ML IJ SOLN
INTRAMUSCULAR | Status: AC
  Filled 2015-03-27: qty 2

## 2015-03-27 MED ORDER — MIDAZOLAM HCL 2 MG/2ML IJ SOLN
INTRAMUSCULAR | Status: AC | PRN
  Administered 2015-03-27: 1 mg via INTRAVENOUS
  Administered 2015-03-27: 0.5 mg via INTRAVENOUS

## 2015-03-27 MED ORDER — INSULIN GLARGINE 100 UNIT/ML ~~LOC~~ SOLN
5.0000 [IU] | Freq: Every day | SUBCUTANEOUS | Status: DC
  Administered 2015-03-27: 5 [IU] via SUBCUTANEOUS
  Filled 2015-03-27 (×2): qty 0.05

## 2015-03-27 NOTE — Sedation Documentation (Signed)
Patient denies pain and is resting comfortably.  

## 2015-03-27 NOTE — Progress Notes (Signed)
PROGRESS NOTE  Walter Wilkins FFM:[id number] DOB: [birth year] DOA: 03/23/2015 PCP: No primary care provider on file.  Admit HPI / Brief Narrative: 59 year old male with history of hypertension, diabetes, and obesity status post remote gastric bypass procedure who presented to [facility] after a witnessed syncopal event. There was some confusion at the scene as to whether the patient actually experienced a cardiac arrest and whether the AED delivered a shock. While in the ER patient returned to normal mentation and had normal hemodynamic findings. Initial EKG without ischemic findings. Cardiology admitted the patient for presumed cardiac syncopal event.   Since admission the patient has undergone cardiac catheterization which revealed no angiographic evidence of CAD and normal systolic function. A d-dimer was elevated at 3.99. CT angiogram of the chest revealed no evidence of pulmonary embolism. There were findings on the left lung concerning for aspiration pneumonitis. More concerning was a finding of multiple low-density lesions in the liver worrisome for metastatic disease. Neurology was consulted. Due to concerns of an underlying brain lesion precipitating seizure activity CT of the head was obtained. This revealed a 1.9 cm high left frontal lobe mass with minimal surrounding edema most consistent with brain metastasis. Radiologist recommended MRI of the brain with and without contrast. MRI brain revealed R-frontal lobe mass with surrounding hemorrhage.  Because of all of these new findings [facility] assumed care of the pt on 03/24/2015.   Assessment/Plan:  Left frontal lobe mass w/ hemorrhage -w/ surrounding edema tx w/ Decadron -Neurology has started Keppra-->continue -MRI notes 17x72mm hemorrhagic L frontal lobe mass w/ no other significant findings  -Neurosurgery has been consulted and is following  -DECADRON DOSING AND DURATION per Neurosurgery recommendations  Liver lesions -Appears  consistent with metastatic disease but no primary elucidated at this juncture -CT abdomen and pelvis confirms liver lesions, but does not reveal evidence of a possible primary lesion  -Does have tiny node in left upper outer quadrant of breast - mother had breast CA  -03/27/15--liver biopsy -Patient reports had colonoscopy 2 years ago with Dr. [place] in [facility] and it was "clean" - has had polyps removed via colo in prior years  -pt admits to unintentional weight loss of late, but can't quantify -03/26/15--case discussed with Med Onc, Dr. Zollie Pee need pathology before able to make any further diagnostic and therapeutic decisions-->follow up in cancer center after d/c  -appointment with MedOnc, Dr. Burr Medico, on 03/29/15 @1pm  and RadOnc after that appt on same day  Syncope and collapse -Based on current evaluation noncardiac in etiology and not related to PE -Suspect patient had seizure  -Cardiac catheterization 03/23/2015--nonobstructive coronaries   Diabetes mellitus type 2  -03/23/2015 hemoglobin A1c 7.2 -CBGs elevated due to steroids -Add pre-meal NovoLog -change to moderate scale -add Lantus 5 units q hs -will have to go home on insulin as pt will remain on steroids  Aspiration pneumonitis LLL and LUL  -Noted on left lung with consolidating features but patient without fever or leukocytosis -No indication to begin antibiotics at this juncture--patient remains afebrile and hemodynamically stable without any hypoxemia -prior lung nodule (for which he had remote PET) was in the LLL and therefore likely presently obscured by his pneumonitis  -may need bronchoscopy if liver bx is negative  Multiple rib fractures -As above -Focus on pain management and pulmonary toileting with flutter valve  HTN  -Blood pressure well controlled at present   History of gastric bypass -Reporting issues related to early  satiety - may need EGD if liver lesions not diagnostic   Chronic  diastolic heart failure -Findings on 2-D echocardiogram this admission -Currently compensated  Reactive depression -Continue Zoloft and Xanax  Obesity - Body mass index is 33.06 kg/(m^2).  Code Status: FULL Family Communication: spoke w/ wife and brother at bedside  Disposition Plan: home 03/28/15 if stable  Consultants: Cards Neurology  Neurosurgery   Antibiotics: none  DVT prophylaxis: SCDs   Procedures/Studies: Ct Head Wo Contrast  03/24/2015   CLINICAL DATA:  Syncopal episode/ cardiac arrest yesterday.  EXAM: CT HEAD WITHOUT CONTRAST  TECHNIQUE: Contiguous axial images were obtained from the base of the skull through the vertex without intravenous contrast.  COMPARISON:  None.  FINDINGS: There is a 1.9 cm mildly hyperattenuating cortical lesion in the high left frontal lobe at the skull vertex with minimal surrounding edema. The appearance is most consistent with a mass rather primary parenchymal hemorrhage. No mass, intracranial hemorrhage, or edema is identified elsewhere on this unenhanced study. There is no evidence of acute infarct, midline shift, or extra-axial fluid collection. Ventricles and sulci are normal.  Orbits are unremarkable. The visualized paranasal sinuses are clear. No significant mastoid effusion is seen. No destructive skull lesions are identified.  IMPRESSION: 1.9 cm high left frontal lobe mass with minimal surrounding edema, most consistent with a brain metastasis given findings on concurrent chest CT. Further evaluation with brain MRI (without and with contrast) is recommended.  These results were called by telephone at the time of interpretation on 03/24/2015 at 12:25 pm to [facility] , who verbally acknowledged these results.   Electronically Signed   By: Logan Bores   On: 03/24/2015 12:32   Ct Angio Chest Pe W/cm &/or Wo Cm  03/24/2015   CLINICAL DATA:  Cardiac arrest.  EXAM: CT ANGIOGRAPHY CHEST WITH CONTRAST  TECHNIQUE: Multidetector CT imaging of the  chest was performed using the standard protocol during bolus administration of intravenous contrast. Multiplanar CT image reconstructions and MIPs were obtained to evaluate the vascular anatomy.  CONTRAST:  12mL OMNIPAQUE IOHEXOL 350 MG/ML SOLN  COMPARISON:  Chest x-ray dated 03/23/2015 and chest CT dated 02/25/2013  FINDINGS: There fractures of the left sixth seventh and eighth ribs anteriorly and of the right and of the right fourth through eighth ribs anteriorly secondary to recent CPR.  There are no pulmonary emboli. There is consolidation of most of the left lower lobe and there is a streaky interstitial infiltrate in the posterior lateral aspect of the left upper lobe. These findings probably represent aspiration pneumonitis. The right lung is clear. There is 1 enlarged left hilar lymph node, 16 mm in diameter on image 113 of series 407.  Images of the upper abdomen demonstrate multiple poorly defined low-density lesions in the liver worrisome for metastatic disease. There is a 3.4 cm lesion in the right lobe on image 99 of series 401. There are least 2 lesions in the left left lobe and at least 7 lesions in the right lobe.  The patient has had gastric bypass surgery and cholecystectomy.  Review of the MIP images confirms the above findings.  IMPRESSION: 1. Multiple low-density lesions in the liver worrisome for metastatic disease. 2. No pulmonary emboli. 3. Consolidative infiltrate in the left lower lobe with interstitial infiltrate in the left upper lobe. I suspect both of these represent aspiration pneumonitis. 4. Single enlarged left hilar lymph node. 5. Multiple bilateral anterior rib fractures most likely secondary to recent CPR.  Electronically Signed   By: Lorriane Shire M.D.   On: 03/24/2015 11:51   Mr Jeri Cos JM Contrast  03/25/2015   CLINICAL DATA:  Seizure while at baseball game, patient does not recall event. No history of seizures. Suspected metastatic disease on recent imaging.  EXAM: MRI  HEAD WITHOUT AND WITH CONTRAST  TECHNIQUE: Multiplanar, multiecho pulse sequences of the brain and surrounding structures were obtained without and with intravenous contrast.  CONTRAST:  68mL MULTIHANCE GADOBENATE DIMEGLUMINE 529 MG/ML IV SOLN  COMPARISON:  CT of the head March 24, 2015  FINDINGS: Focal susceptibility artifact and slight reduced diffusion associated with low low T1, low T2 homogeneously enhancing 17 x 15 mm LEFT cortical based frontal convexity mass with mild surrounding vasogenic edema. No additional parenchymal masses. LEFT frontal developmental venous anomaly.  No reduced diffusion to suggest acute ischemia. Ventricles and sulci are are overall normal for patient's age. A few scattered subcentimeter white matter FLAIR T2 hyperintensities may reflect chronic small vessel ischemic disease, within normal range for patient's age. No midline shift or significant mass effect. No abnormal extra-axial fluid collections nor leptomeningeal enhancement. Normal major intracranial vascular flow voids seen at the skull base.  Mild motion degraded coronal T2 with relatively symmetric appearance of the hippocampi with normal morphology and signal characteristics.  Ocular globes and orbital contents are unremarkable. Paranasal sinuses and mastoid air cells are well aerated. No abnormal sellar expansion. No cerebellar tonsillar ectopia. No suspicious calvarial bone marrow signal.  IMPRESSION: Solitary 17 x 15 mm hemorrhagic LEFT frontal lobe mass corresponding to CT abnormality with imaging characteristics of metastasis.  Otherwise unremarkable MRI of the brain with contrast for age.   Electronically Signed   By: Elon Alas   On: 03/25/2015 03:41   Ct Abdomen Pelvis W Contrast  03/25/2015   CLINICAL DATA:  LIVER LESION NOTED ON ANGIO CHEST FROM 03-24-15, DIABETIC, 15/1.51/ followup exam.  EXAM: CT ABDOMEN AND PELVIS WITH CONTRAST  TECHNIQUE: Multidetector CT imaging of the abdomen and pelvis was performed  using the standard protocol following bolus administration of intravenous contrast.  CONTRAST:  120mL OMNIPAQUE IOHEXOL 300 MG/ML  SOLN  COMPARISON:  CTA chest, 03/24/2015.  PET-CT, 03/04/2013.  FINDINGS: There are multiple low-density liver masses. The margins of views are somewhat ill-defined. There is mild heterogeneity of the internal attenuation with reticular areas of apparent enhancement. These are not cysts. Largest mass arises from the anterior segment of the right lobe measuring 3.4 cm x 3.3 cm transversely. A mass measuring 2 cm from the inferior margin of the lateral segment of the left lobe bulges the inferior liver contour.  As noted on the recent chest CT, there is consolidation in the left lower lobe and at the posterior inferior base of the left upper lobe. Volume loss in the left lower lobe with posterior deviation of the oblique fissure suggests that at least a component of this opacity is atelectasis. There is some central heterogeneous attenuating soft tissue lying anterior to the thoracic aorta and to the left of the esophagus. This may reflect adenopathy. It measures approximately 2.3 cm x 2.4 cm in size. There is a minimal left pleural effusion and trace amount of right pleural fluid. Milder dependent atelectasis is noted in the posterior right lower lobe.  There changes from previous gastric surgery. No gastric mass is seen.  A portion of the transverse colon enters a paraumbilical midline hernia. There is no evidence of obstruction, incarceration or strangulation. There are few colonic  diverticula but no evidence of diverticulitis. Small bowel unremarkable.  Gallbladder surgically absent.  No bile duct dilation.  Spleen, pancreas, adrenal glands, kidneys, ureters, bladder: Unremarkable.  No pathologically enlarged lymph nodes.  No ascites.  No osteoblastic or osteolytic lesions.  IMPRESSION: 1. Multiple low-density liver lesions highly suspicious for metastatic disease. Largest lesion lies  in the right lobe measuring 3.4 cm in greatest transverse dimension. 2. No other evidence of metastatic disease below the diaphragm. No acute findings within the abdomen or pelvis. 3. Lung base findings that are unchanged from the previous day's CT angiogram. Consolidation/atelectasis in left lower lobe. Probable adenopathy in the posterior inferior left mediastinum. Prior PET-CT evaluated a small left lower lobe nodule. This nodule was at the left lung base. This nodule is not visualized currently--it may be obscured by the lung consolidation. Consider repeat follow-up PET-CT to evaluate the source of presumed metastatic disease to the liver.   Electronically Signed   By: Lajean Manes M.D.   On: 03/25/2015 12:30   US Biopsy  03/27/2015   CLINICAL DATA:  59 year-old with multiple liver lesions. Tissue diagnosis is needed.  EXAM: ULTRASOUND-GUIDED LIVER LESION BIOPSY  Physician: Stephan Minister. Anselm Pancoast, MD  FLUOROSCOPY TIME:  None  MEDICATIONS: 1.5 mg versed, 75 mcg fentanyl. A radiology nurse monitored the patient for moderate sedation.  ANESTHESIA/SEDATION: Moderate sedation time: 12 minutes  PROCEDURE: The procedure was explained to the patient. The risks and benefits of the procedure were discussed and the patient's questions were addressed. Informed consent was obtained from the patient. Liver was evaluated with ultrasound. A lesion in the right hepatic lobe was selected for biopsy. The right side of the abdomen was prepped and draped in sterile fashion. Skin and liver capsule were anesthetized with 1% lidocaine. Using ultrasound guidance, a 17 gauge needle was directed into the lesion. Three core biopsies were obtained with an 18 gauge core device. Specimens placed in formalin. 17 gauge needle was removed without complication. Bandage placed over the puncture site.  FINDINGS: Round hypoechoic nodule in the right hepatic lobe. Needle position confirmed within this lesion. No significant bleeding following the core  biopsies.  Estimated blood loss: Minimal  COMPLICATIONS: None  IMPRESSION: Ultrasound-guided core biopsies of a right hepatic lesion.   Electronically Signed   By: Markus Daft M.D.   On: 03/27/2015 11:29   Dg Chest Portable 1 View  03/23/2015   CLINICAL DATA:  Syncope at baseball game with CPR.  EXAM: PORTABLE CHEST - 1 VIEW  COMPARISON:  None.  FINDINGS: There is airspace opacity throughout the left lung. Heart size and upper mediastinal contours are within normal limits for technique. No evidence of effusion or air leak. No evidence of rib fracture post CPR.  IMPRESSION: Airspace disease throughout the left lung, likely aspiration given the history. CPR related lung contusion considered unlikely given no visible rib fracture.   Electronically Signed   By: Monte Fantasia M.D.   On: 03/23/2015 15:03   Dg Knee Left Port  03/24/2015   CLINICAL DATA:  Fall on concrete at baseball game yesterday. Landing on left knee. Unable to bear weight.  EXAM: PORTABLE LEFT KNEE - 1-2 VIEW  COMPARISON:  None.  FINDINGS: Small joint effusion. No acute bony abnormality. No fracture, subluxation or dislocation. Soft tissues are intact.  IMPRESSION: Small joint effusion.  No acute bony abnormality.   Electronically Signed   By: Rolm Baptise M.D.   On: 03/24/2015 10:15  Subjective: Patient complains of a mild headache. Otherwise he denies any fevers, chills, chest pain, shortness breath, nausea, vomiting, diarrhea,, pain.  Objective: Filed Vitals:   03/27/15 1035 03/27/15 1053 03/27/15 1300 03/27/15 1650  BP: 135/72 128/67 137/72 114/65  Pulse: 51 53 54 51  Temp:   98.1 F (36.7 C) 98.2 F (36.8 C)  TempSrc:   Oral Oral  Resp: 15 9 18 20   Height:      Weight:      SpO2: 98% 95% 98% 95%    Intake/Output Summary (Last 24 hours) at 03/27/15 1824 Last data filed at 03/27/15 1300  Gross per 24 hour  Intake    840 ml  Output      0 ml  Net    840 ml   Weight change:  Exam:   General:  Pt is  alert, follows commands appropriately, not in acute distress  HEENT: No icterus, No thrush,  [place]/AT  Cardiovascular: RRR, S1/S2, no rubs, no gallops  Respiratory: CTA bilaterally, no wheezing, no crackles, no rhonchi  Abdomen: Soft/+BS, non tender, non distended, no guarding  Extremities: No edema, No lymphangitis, No petechiae, No rashes, no synovitis  Data Reviewed: Basic Metabolic Panel:  Recent Labs Lab 03/23/15 1428 03/23/15 1449 03/23/15 2120 03/24/15 0318 03/25/15 0237 03/27/15 0641  NA 139 138  --  137 134* 137  K 4.7 4.6  --  4.3 4.4 4.5  CL 101 102  --  103 100 100  CO2 20  --   --  29 27 28   GLUCOSE 181* 183*  --  126* 192* 220*  BUN 14 20  --  13 14 22   CREATININE 1.27 1.30 0.91 0.93 0.96 0.99  CALCIUM 9.1  --   --  8.5 8.9 8.8  MG  --   --  1.7  --  2.1  --    Liver Function Tests:  Recent Labs Lab 03/24/15 0318  AST 28  ALT 22  ALKPHOS 135*  BILITOT 0.5  PROT 6.0  ALBUMIN 3.1*   No results for input(s): LIPASE, AMYLASE in the last 168 hours. No results for input(s): AMMONIA in the last 168 hours. CBC:  Recent Labs Lab 03/23/15 1428 03/23/15 1449 03/23/15 2120 03/24/15 0318 03/27/15 0641  WBC 10.1  --  8.5 9.0 13.1*  HGB 15.5 17.3* 12.0* 13.9 14.2  HCT 46.2 51.0 36.0* 42.0 42.3  MCV 90.4  --  89.1 89.4 88.3  PLT 221  --  187 190 229   Cardiac Enzymes: No results for input(s): CKTOTAL, CKMB, CKMBINDEX, TROPONINI in the last 168 hours. BNP: Invalid input(s): POCBNP CBG:  Recent Labs Lab 03/26/15 1633 03/26/15 2200 03/27/15 0659 03/27/15 1146 03/27/15 1617  GLUCAP 228* 228* 195* 153* 280*    Recent Results (from the past 240 hour(s))  MRSA PCR Screening     Status: None   Collection Time: 03/23/15  4:17 PM  Result Value Ref Range Status   MRSA by PCR NEGATIVE NEGATIVE Final    Comment:        The GeneXpert MRSA Assay (FDA approved for NASAL specimens only), is one component of a comprehensive MRSA  colonization surveillance program. It is not intended to diagnose MRSA infection nor to guide or monitor treatment for MRSA infections.      Scheduled Meds: . atorvastatin  40 mg Oral q1800  . dexamethasone  4 mg Intravenous 4 times per day  . fentaNYL      . gelatin adsorbable      .  insulin aspart  0-15 Units Subcutaneous TID WC  . insulin aspart  4 Units Subcutaneous TID WC  . levETIRAcetam  500 mg Oral BID  . lidocaine (PF)      . lidocaine (PF)      . midazolam      . sertraline  100 mg Oral BID   Continuous Infusions:    Shaune Malacara, DO  [facility] Pager [phone]  If 7PM-7AM, please contact night-coverage [email or web address] Password [email or web address] 03/27/2015, 6:24 PM   LOS: 4 days

## 2015-03-27 NOTE — Progress Notes (Signed)
Inpatient Diabetes Program Recommendations  AACE/ADA: New Consensus Statement on Inpatient Glycemic Control (2013)  Target Ranges:  Prepandial:   less than 140 mg/dL      Peak postprandial:   less than 180 mg/dL (1-2 hours)      Critically ill patients:  140 - 180 mg/dL   Results for Walter Wilkins, Walter Wilkins (MRN [id number]) as of 03/27/2015 09:31  Ref. Range 03/26/2015 06:55 03/26/2015 10:32 03/26/2015 16:33 03/26/2015 22:00 03/27/2015 06:59  Glucose-Capillary Latest Range: 70-99 mg/dL 193 (H) 202 (H) 228 (H) 228 (H) 195 (H)   Diabetes history: DM2 Outpatient Diabetes medications: Metformin 1000 mg BID Current orders for Inpatient glycemic control: Novolog 0-15 units TID with meals, Novolog 4 units TID with meals for meal coverage  Inpatient Diabetes Program Recommendations Insulin - Basal: If Decadron is continued, please consider ordering low dose basal insulin. Recommend starting with Levemir 5 units Q24H. Correction (SSI): Please consider ordering Novolog bedtime correction scale. Insulin - Meal Coverage: If Decadron is continued, please consider increasing meal coverage to Novolog 6 units TID with meals if patient eats at least 50% of meals.  Thanks, Barnie Alderman, RN, MSN, CCRN, CDE Diabetes Coordinator Inpatient Diabetes Program [phone] (Team Pager from [place] to [place]) [phone] ([facility] office) [phone] [facility] office)

## 2015-03-27 NOTE — Progress Notes (Signed)
Patient ID: Walter Wilkins, male   DOB: [birth year], 59 y.o.   MRN: [id number] Neuro stable. Cancer w/u in progress

## 2015-03-27 NOTE — Progress Notes (Signed)
Merceda Elks RN made me aware of a current IP with a solitary brain met. Path is pending a liver biopsy done today, 4/4. He is scheduled to see Dr. Burr Medico on Wed 4/6 @ 1:30. Dr. Isidore Moos has reviewed his films and feels that he may be a good candidate for SRS. I have him scheduled to see Dr. Isidore Moos for consult on Wed 4/6 following his appointment with Dr. Burr Medico.   [facility] Dutton R.T. (R) (T) Radiation Special Procedures [place] [phone] Office [phone] Pager [phone] Fax [email or web address]

## 2015-03-27 NOTE — Procedures (Signed)
US guided liver lesion biopsy.  3 cores taken from right hepatic lesion.  No immediate complication.  Minimal blood loss.

## 2015-03-27 NOTE — Progress Notes (Signed)
UR COMPLETED  

## 2015-03-27 NOTE — Telephone Encounter (Signed)
Called Dr. Carles Collet with appointment with Dr. Burr Medico for 03/29/15 at 1:00/1:30. He will notify patient. Inquired if a radiation oncology referral is needed; said to go ahead and order this.

## 2015-03-28 ENCOUNTER — Encounter: Payer: Self-pay | Admitting: Radiation Oncology

## 2015-03-28 DIAGNOSIS — R160 Hepatomegaly, not elsewhere classified: Secondary | ICD-10-CM

## 2015-03-28 LAB — GLUCOSE, CAPILLARY: Glucose-Capillary: 204 mg/dL — ABNORMAL HIGH (ref 70–99)

## 2015-03-28 MED ORDER — INSULIN PEN NEEDLE 32G X 4 MM MISC: Status: DC

## 2015-03-28 MED ORDER — LEVETIRACETAM 500 MG PO TABS: 500.0000 mg | ORAL_TABLET | Freq: Two times a day (BID) | ORAL | Status: DC

## 2015-03-28 MED ORDER — BLOOD GLUCOSE MONITOR KIT: PACK | Status: AC

## 2015-03-28 MED ORDER — INSULIN GLARGINE 100 UNIT/ML ~~LOC~~ SOLN
7.0000 [IU] | Freq: Every day | SUBCUTANEOUS | Status: DC
  Filled 2015-03-28: qty 0.07

## 2015-03-28 MED ORDER — DEXAMETHASONE 4 MG PO TABS: 4.0000 mg | ORAL_TABLET | Freq: Four times a day (QID) | ORAL | Status: DC

## 2015-03-28 MED ORDER — INSULIN GLARGINE 100 UNIT/ML SOLOSTAR PEN: 5.0000 [IU] | PEN_INJECTOR | Freq: Every day | SUBCUTANEOUS | Status: DC

## 2015-03-28 NOTE — Progress Notes (Signed)
Patient given DC instructions and prescriptions. All questions answered. Volunteer to escort patient in [facility] to lobby. Patient's brother to transport home in private vehicle.

## 2015-03-28 NOTE — Progress Notes (Addendum)
Location/Histology of Brain Tumor:  03/25/15 MRI Brain :Solitary 17 x 15 mm hemorrhagic left frontal lobe mass  03/27/15 Liver Diagnosis Liver, needle/core biopsy, right lobe - METASTATIC ADENOCARCINOMA, SEE COMMENT.  Patient presented with symptoms of:  Seizure, cardiac arrest while at ball game, CPR administered, no hx seizures  Past or anticipated interventions, if any, per neurosurgery: Dr Joya Salm: 03/26/15 "resection can be done if advised by oncology"  Past or anticipated interventions, if any, per medical oncology: new consult with Dr Burr Medico on 03/29/15 1:30 pm  Dose of Decadron, if applicable: 4 mg every 6 hours beginning 03/28/15  Recent neurologic symptoms, if any:   Seizures: x 1 prior to diagnosis, Keppra 500 mg twice daily, begin on 03/28/15  Headaches: middle forehead, dull HA, has not taken any medications for HA  Nausea: no  Dizziness/ataxia: no  Difficulty with hand coordination: no  Focal numbness/weakness: no  Visual deficits/changes: no  Confusion/Memory deficits: amnesia to event, "difficulty finding my words"  Painful bone metastases at present, if any: na  SAFETY ISSUES:  Prior radiation? no  Pacemaker/ICD? no  Possible current pregnancy? na  Is the patient on methotrexate? no  Additional Complaints / other details: recent unintentional weight loss, hx gastric bypass ~ 2011 [facility] married Pt circled 10 on distress screening; Polo Riley, [place] notified by phone. She will touch base with patient today.

## 2015-03-28 NOTE — Discharge Summary (Signed)
Physician Discharge Summary  Walter Wilkins FIE:[id number] DOB: September 29, 1956 DOA: 03/23/2015  PCP: No primary care provider on file.  Admit date: 03/23/2015 Discharge date: 03/28/2015  Recommendations for Outpatient Follow-up:  1. Pt will need to follow up with PCP in 2 weeks post discharge 2. Follow up MedOnc, Dr. Burr Medico 03/29/15 3. Patient instructed not to drive until medically cleared by his physicians 4. Please wean Decadron as indicated 5. Please follow up on pathology of liver biopsy  Discharge Diagnoses:  Left frontal lobe mass w/ hemorrhage -w/ surrounding edema tx w/ Decadron -Neurology has started Keppra-->continue -MRI notes 17x61m hemorrhagic L frontal lobe mass w/ no other significant findings  -Neurosurgery has been consulted and is following  -Patient will go home with Decadron 4 mg po every 6 hours. He will follow up with radiation oncology will continue to wean the patient's Decadron as indicated.  Liver lesions -Appears consistent with metastatic disease but no primary elucidated at this juncture -CT abdomen and pelvis confirms liver lesions, but does not reveal evidence of a possible primary lesion  -Does have tiny node in left upper outer quadrant of breast - mother had breast CA  -03/27/15--liver biopsy--path pending at time of d/c -[facility] - has had polyps removed via colo in prior years  -pt admits to unintentional weight loss of late, but can't quantify -03/26/15--case discussed with Med Onc, Dr. SZollie Peeneed pathology before able to make any further diagnostic and therapeutic decisions-->follow up in cancer center after d/c  -appointment with MedOnc, Dr. FBurr Medico on 03/29/15 _0  and RadOnc after that appt on same day  Syncope and collapse -Based on current evaluation noncardiac in etiology and not related to PE -Suspect patient had seizure  -Cardiac catheterization  03/23/2015--nonobstructive coronaries  -Patient will go home with Keppra 500 mg twice a day -Patient was instructed not to drive-  Diabetes mellitus type 2  -03/23/2015 hemoglobin A1c 7.2 -CBGs elevated due to steroids -Add pre-meal NovoLog during the hospitalization -change to moderate scale during hospitalization -add Lantus 5 units q hs -will have to go home on insulin as pt will remain on steroids--plan to discharge with Lantus 5 units at bedtime. The patient was instructed to restart his metformin. He was instructed to check his CBGs twice a day and to keep a glycemic log  She will take to his primary care provider for further adjustments.  Aspiration pneumonitis LLL and LUL  -Noted on left lung with consolidating features but patient without fever or leukocytosis -No indication to begin antibiotics at this juncture--patient remains afebrile and hemodynamically stable without any hypoxemia -prior lung nodule (for which he had remote PET) was in the LLL and therefore likely presently obscured by his pneumonitis  -may need bronchoscopy if liver bx is negative  Multiple rib fractures -As above -Focus on pain management and pulmonary toileting with flutter valve  HTN  -Blood pressure well controlled at present  -Patient can resume low-dose lisinopril after discharge-  History of gastric bypass -Reporting issues related to early satiety - may need EGD if liver lesions not diagnostic   Chronic diastolic heart failure -Findings on 2-D echocardiogram this admission -Currently compensated  Reactive depression -Continue Zoloft and Xanax  Obesity - Body mass index is 33.06 kg/(m^2).  Discharge Condition: stable  Disposition: home Follow-up Information    Follow up with FTruitt Merle MD.   Specialties:  Hematology, Oncology   Why:  03/29/15 @  1PM   Contact information:   [place] [id number] [phone]       Diet:carb modified Wt Readings from Last 3  Encounters:  03/27/15 114.3 kg (251 lb 15.8 oz)    History of present illness:  59 year old male with history of hypertension, diabetes, and obesity status post remote gastric bypass procedure who presented to [facility] after a witnessed syncopal event. There was some confusion at the scene as to whether the patient actually experienced a cardiac arrest and whether the AED delivered a shock. While in the ER patient returned to normal mentation and had normal hemodynamic findings. Initial EKG without ischemic findings. Cardiology admitted the patient for presumed cardiac syncopal event.   Since admission the patient has undergone cardiac catheterization which revealed no angiographic evidence of CAD and normal systolic function. A d-dimer was elevated at 3.99. CT angiogram of the chest revealed no evidence of pulmonary embolism. There were findings on the left lung concerning for aspiration pneumonitis. More concerning was a finding of multiple low-density lesions in the liver worrisome for metastatic disease. Neurology was consulted. Due to concerns of an underlying brain lesion precipitating seizure activity CT of the head was obtained. This revealed a 1.9 cm high left frontal lobe mass with minimal surrounding edema most consistent with brain metastasis. Radiologist recommended MRI of the brain with and without contrast. MRI brain revealed R-frontal lobe mass with surrounding hemorrhage. Because of all of these new findings [facility] assumed care of the pt on 03/24/2015.  Discharge Exam: Filed Vitals:   03/28/15 0700  BP: 127/69  Pulse: 50  Temp: 97.8 F (36.6 C)  Resp: 20   Filed Vitals:   03/27/15 1650 03/27/15 2226 03/28/15 0200 03/28/15 0700  BP: 114/65 142/78 153/86 127/69  Pulse: 51 50 51 50  Temp: 98.2 F (36.8 C) 97.5 F (36.4 C) 97.5 F (36.4 C) 97.8 F (36.6 C)  TempSrc: Oral Oral Oral Oral  Resp: _0 Height:      Weight:      SpO2: 95% 97% 99% 99%   General: A&O x 3,  NAD, pleasant, cooperative Cardiovascular: RRR, no rub, no gallop, no S3 Respiratory: CTAB, no wheeze, no rhonchi Abdomen:soft, nontender, nondistended, positive bowel sounds Extremities: No edema, No lymphangitis, no petechiae  Discharge Instructions      Discharge Instructions    Diet - low sodium heart healthy    Complete by:  As directed      Increase activity slowly    Complete by:  As directed             Medication List    STOP taking these medications        aspirin EC 81 MG tablet      TAKE these medications        ALPRAZolam 0.25 MG tablet  Commonly known as:  XANAX  Take 0.25 mg by mouth 2 (two) times daily as needed for anxiety. Last Refill 08/22/2014     atorvastatin 40 MG tablet  Commonly known as:  LIPITOR  Take 40 mg by mouth daily. Last Refill 08/22/2014     blood glucose meter kit and supplies Kit  Dispense based on patient and insurance preference. Use up to four times daily as directed. (FOR ICD-9 250.00, 250.01).     dexamethasone 4 MG tablet  Commonly known as:  DECADRON  Take 1 tablet (4 mg total) by mouth every 6 (six) hours.  Insulin Glargine 100 UNIT/ML Solostar Pen  Commonly known as:  LANTUS SOLOSTAR  Inject 5 Units into the skin daily at 10 pm.     Insulin Pen Needle 32G X 4 MM Misc  Use with insulin pen as directed to dispense insulin     levETIRAcetam 500 MG tablet  Commonly known as:  KEPPRA  Take 1 tablet (500 mg total) by mouth 2 (two) times daily.     lisinopril 5 MG tablet  Commonly known as:  PRINIVIL,ZESTRIL  Take 5 mg by mouth daily. Last Refill 08/10/2014     metFORMIN 1000 MG tablet  Commonly known as:  GLUCOPHAGE  Take 1,000 mg by mouth 2 (two) times daily with a meal. Last refill 08/23/2014     omeprazole 20 MG capsule  Commonly known as:  PRILOSEC  Take 20 mg by mouth daily. Last Refill 08/22/2014     sertraline 100 MG tablet  Commonly known as:  ZOLOFT  Take 100 mg by mouth 2 (two) times daily. Last refill  08/22/2014         The results of significant diagnostics from this hospitalization (including imaging, microbiology, ancillary and laboratory) are listed below for reference.    Significant Diagnostic Studies: Ct Head Wo Contrast  03/24/2015   CLINICAL DATA:  Syncopal episode/ cardiac arrest yesterday.  EXAM: CT HEAD WITHOUT CONTRAST  TECHNIQUE: Contiguous axial images were obtained from the base of the skull through the vertex without intravenous contrast.  COMPARISON:  None.  FINDINGS: There is a 1.9 cm mildly hyperattenuating cortical lesion in the high left frontal lobe at the skull vertex with minimal surrounding edema. The appearance is most consistent with a mass rather primary parenchymal hemorrhage. No mass, intracranial hemorrhage, or edema is identified elsewhere on this unenhanced study. There is no evidence of acute infarct, midline shift, or extra-axial fluid collection. Ventricles and sulci are normal.  Orbits are unremarkable. The visualized paranasal sinuses are clear. No significant mastoid effusion is seen. No destructive skull lesions are identified.  IMPRESSION: 1.9 cm high left frontal lobe mass with minimal surrounding edema, most consistent with a brain metastasis given findings on concurrent chest CT. Further evaluation with brain MRI (without and with contrast) is recommended.  These results were called by telephone at the time of interpretation on 03/24/2015 at 12:25 pm to PA [facility] , who verbally acknowledged these results.   Electronically Signed   By: Logan Bores   On: 03/24/2015 12:32   Ct Angio Chest Pe W/cm &/or Wo Cm  03/24/2015   CLINICAL DATA:  Cardiac arrest.  EXAM: CT ANGIOGRAPHY CHEST WITH CONTRAST  TECHNIQUE: Multidetector CT imaging of the chest was performed using the standard protocol during bolus administration of intravenous contrast. Multiplanar CT image reconstructions and MIPs were obtained to evaluate the vascular anatomy.  CONTRAST:  73m OMNIPAQUE  IOHEXOL 350 MG/ML SOLN  COMPARISON:  Chest x-ray dated 03/23/2015 and chest CT dated 02/25/2013  FINDINGS: There fractures of the left sixth seventh and eighth ribs anteriorly and of the right and of the right fourth through eighth ribs anteriorly secondary to recent CPR.  There are no pulmonary emboli. There is consolidation of most of the left lower lobe and there is a streaky interstitial infiltrate in the posterior lateral aspect of the left upper lobe. These findings probably represent aspiration pneumonitis. The right lung is clear. There is 1 enlarged left hilar lymph node, 16 mm in diameter on image 113 of series 407.  Images  of the upper abdomen demonstrate multiple poorly defined low-density lesions in the liver worrisome for metastatic disease. There is a 3.4 cm lesion in the right lobe on image 99 of series 401. There are least 2 lesions in the left left lobe and at least 7 lesions in the right lobe.  The patient has had gastric bypass surgery and cholecystectomy.  Review of the MIP images confirms the above findings.  IMPRESSION: 1. Multiple low-density lesions in the liver worrisome for metastatic disease. 2. No pulmonary emboli. 3. Consolidative infiltrate in the left lower lobe with interstitial infiltrate in the left upper lobe. I suspect both of these represent aspiration pneumonitis. 4. Single enlarged left hilar lymph node. 5. Multiple bilateral anterior rib fractures most likely secondary to recent CPR.   Electronically Signed   By: Lorriane Shire M.D.   On: 03/24/2015 11:51   Mr Jeri Cos VO Contrast  03/25/2015   CLINICAL DATA:  Seizure while at baseball game, patient does not recall event. No history of seizures. Suspected metastatic disease on recent imaging.  EXAM: MRI HEAD WITHOUT AND WITH CONTRAST  TECHNIQUE: Multiplanar, multiecho pulse sequences of the brain and surrounding structures were obtained without and with intravenous contrast.  CONTRAST:  44m MULTIHANCE GADOBENATE DIMEGLUMINE  529 MG/ML IV SOLN  COMPARISON:  CT of the head March 24, 2015  FINDINGS: Focal susceptibility artifact and slight reduced diffusion associated with low low T1, low T2 homogeneously enhancing 17 x 15 mm LEFT cortical based frontal convexity mass with mild surrounding vasogenic edema. No additional parenchymal masses. LEFT frontal developmental venous anomaly.  No reduced diffusion to suggest acute ischemia. Ventricles and sulci are are overall normal for patient's age. A few scattered subcentimeter white matter FLAIR T2 hyperintensities may reflect chronic small vessel ischemic disease, within normal range for patient's age. No midline shift or significant mass effect. No abnormal extra-axial fluid collections nor leptomeningeal enhancement. Normal major intracranial vascular flow voids seen at the skull base.  Mild motion degraded coronal T2 with relatively symmetric appearance of the hippocampi with normal morphology and signal characteristics.  Ocular globes and orbital contents are unremarkable. Paranasal sinuses and mastoid air cells are well aerated. No abnormal sellar expansion. No cerebellar tonsillar ectopia. No suspicious calvarial bone marrow signal.  IMPRESSION: Solitary 17 x 15 mm hemorrhagic LEFT frontal lobe mass corresponding to CT abnormality with imaging characteristics of metastasis.  Otherwise unremarkable MRI of the brain with contrast for age.   Electronically Signed   By: CElon Alas  On: 03/25/2015 03:41   Ct Abdomen Pelvis W Contrast  03/25/2015   CLINICAL DATA:  LIVER LESION NOTED ON ANGIO CHEST FROM 03-24-15, DIABETIC, 15/1.51/ followup exam.  EXAM: CT ABDOMEN AND PELVIS WITH CONTRAST  TECHNIQUE: Multidetector CT imaging of the abdomen and pelvis was performed using the standard protocol following bolus administration of intravenous contrast.  CONTRAST:  1054mOMNIPAQUE IOHEXOL 300 MG/ML  SOLN  COMPARISON:  CTA chest, 03/24/2015.  PET-CT, 03/04/2013.  FINDINGS: There are multiple  low-density liver masses. The margins of views are somewhat ill-defined. There is mild heterogeneity of the internal attenuation with reticular areas of apparent enhancement. These are not cysts. Largest mass arises from the anterior segment of the right lobe measuring 3.4 cm x 3.3 cm transversely. A mass measuring 2 cm from the inferior margin of the lateral segment of the left lobe bulges the inferior liver contour.  As noted on the recent chest CT, there is consolidation in the left lower  lobe and at the posterior inferior base of the left upper lobe. Volume loss in the left lower lobe with posterior deviation of the oblique fissure suggests that at least a component of this opacity is atelectasis. There is some central heterogeneous attenuating soft tissue lying anterior to the thoracic aorta and to the left of the esophagus. This may reflect adenopathy. It measures approximately 2.3 cm x 2.4 cm in size. There is a minimal left pleural effusion and trace amount of right pleural fluid. Milder dependent atelectasis is noted in the posterior right lower lobe.  There changes from previous gastric surgery. No gastric mass is seen.  A portion of the transverse colon enters a paraumbilical midline hernia. There is no evidence of obstruction, incarceration or strangulation. There are few colonic diverticula but no evidence of diverticulitis. Small bowel unremarkable.  Gallbladder surgically absent.  No bile duct dilation.  Spleen, pancreas, adrenal glands, kidneys, ureters, bladder: Unremarkable.  No pathologically enlarged lymph nodes.  No ascites.  No osteoblastic or osteolytic lesions.  IMPRESSION: 1. Multiple low-density liver lesions highly suspicious for metastatic disease. Largest lesion lies in the right lobe measuring 3.4 cm in greatest transverse dimension. 2. No other evidence of metastatic disease below the diaphragm. No acute findings within the abdomen or pelvis. 3. Lung base findings that are unchanged  from the previous day's CT angiogram. Consolidation/atelectasis in left lower lobe. Probable adenopathy in the posterior inferior left mediastinum. Prior PET-CT evaluated a small left lower lobe nodule. This nodule was at the left lung base. This nodule is not visualized currently--it may be obscured by the lung consolidation. Consider repeat follow-up PET-CT to evaluate the source of presumed metastatic disease to the liver.   Electronically Signed   By: Lajean Manes M.D.   On: 03/25/2015 12:30   US Biopsy  03/27/2015   CLINICAL DATA:  59 year-old with multiple liver lesions. Tissue diagnosis is needed.  EXAM: ULTRASOUND-GUIDED LIVER LESION BIOPSY  Physician: Stephan Minister. Anselm Pancoast, MD  FLUOROSCOPY TIME:  None  MEDICATIONS: 1.5 mg versed, 75 mcg fentanyl. A radiology nurse monitored the patient for moderate sedation.  ANESTHESIA/SEDATION: Moderate sedation time: 12 minutes  PROCEDURE: The procedure was explained to the patient. The risks and benefits of the procedure were discussed and the patient's questions were addressed. Informed consent was obtained from the patient. Liver was evaluated with ultrasound. A lesion in the right hepatic lobe was selected for biopsy. The right side of the abdomen was prepped and draped in sterile fashion. Skin and liver capsule were anesthetized with 1% lidocaine. Using ultrasound guidance, a 17 gauge needle was directed into the lesion. Three core biopsies were obtained with an 18 gauge core device. Specimens placed in formalin. 17 gauge needle was removed without complication. Bandage placed over the puncture site.  FINDINGS: Round hypoechoic nodule in the right hepatic lobe. Needle position confirmed within this lesion. No significant bleeding following the core biopsies.  Estimated blood loss: Minimal  COMPLICATIONS: None  IMPRESSION: Ultrasound-guided core biopsies of a right hepatic lesion.   Electronically Signed   By: Markus Daft M.D.   On: 03/27/2015 11:29   Dg Chest Portable 1  View  03/23/2015   CLINICAL DATA:  Syncope at baseball game with CPR.  EXAM: PORTABLE CHEST - 1 VIEW  COMPARISON:  None.  FINDINGS: There is airspace opacity throughout the left lung. Heart size and upper mediastinal contours are within normal limits for technique. No evidence of effusion or air leak. No evidence of  rib fracture post CPR.  IMPRESSION: Airspace disease throughout the left lung, likely aspiration given the history. CPR related lung contusion considered unlikely given no visible rib fracture.   Electronically Signed   By: Monte Fantasia M.D.   On: 03/23/2015 15:03   Dg Knee Left Port  03/24/2015   CLINICAL DATA:  Fall on concrete at baseball game yesterday. Landing on left knee. Unable to bear weight.  EXAM: PORTABLE LEFT KNEE - 1-2 VIEW  COMPARISON:  None.  FINDINGS: Small joint effusion. No acute bony abnormality. No fracture, subluxation or dislocation. Soft tissues are intact.  IMPRESSION: Small joint effusion.  No acute bony abnormality.   Electronically Signed   By: Rolm Baptise M.D.   On: 03/24/2015 10:15     Microbiology: Recent Results (from the past 240 hour(s))  MRSA PCR Screening     Status: None   Collection Time: 03/23/15  4:17 PM  Result Value Ref Range Status   MRSA by PCR NEGATIVE NEGATIVE Final    Comment:        The GeneXpert MRSA Assay (FDA approved for NASAL specimens only), is one component of a comprehensive MRSA colonization surveillance program. It is not intended to diagnose MRSA infection nor to guide or monitor treatment for MRSA infections.      Labs: Basic Metabolic Panel:  Recent Labs Lab 03/23/15 1428 03/23/15 1449 03/23/15 2120 03/24/15 0318 03/25/15 0237 03/27/15 0641  NA 139 138  --  137 134* 137  K 4.7 4.6  --  4.3 4.4 4.5  CL 101 102  --  103 100 100  CO2 20  --   --  _0 GLUCOSE 181* 183*  --  126* 192* 220*  BUN 14 20  --  _1 CREATININE 1.27 1.30 0.91 0.93 0.96 0.99  CALCIUM 9.1  --   --  8.5 8.9 8.8  MG   --   --  1.7  --  2.1  --    Liver Function Tests:  Recent Labs Lab 03/24/15 0318  AST 28  ALT 22  ALKPHOS 135*  BILITOT 0.5  PROT 6.0  ALBUMIN 3.1*   No results for input(s): LIPASE, AMYLASE in the last 168 hours. No results for input(s): AMMONIA in the last 168 hours. CBC:  Recent Labs Lab 03/23/15 1428 03/23/15 1449 03/23/15 2120 03/24/15 0318 03/27/15 0641  WBC 10.1  --  8.5 9.0 13.1*  HGB 15.5 17.3* 12.0* 13.9 14.2  HCT 46.2 51.0 36.0* 42.0 42.3  MCV 90.4  --  89.1 89.4 88.3  PLT 221  --  187 190 229   Cardiac Enzymes: No results for input(s): CKTOTAL, CKMB, CKMBINDEX, TROPONINI in the last 168 hours. BNP: Invalid input(s): POCBNP CBG:  Recent Labs Lab 03/27/15 0659 03/27/15 1146 03/27/15 1617 03/27/15 2223 03/28/15 0607  GLUCAP 195* 153* 280* 267* 204*    Time coordinating discharge:  Greater than 30 minutes  Signed:  Molly Savarino, DO [facility] Pager: [phone] 03/28/2015, 8:29 AM

## 2015-03-29 ENCOUNTER — Ambulatory Visit (HOSPITAL_BASED_OUTPATIENT_CLINIC_OR_DEPARTMENT_OTHER): Payer: Commercial Managed Care - PPO

## 2015-03-29 ENCOUNTER — Encounter: Payer: Self-pay | Admitting: Radiation Oncology

## 2015-03-29 ENCOUNTER — Encounter: Payer: Self-pay | Admitting: Hematology

## 2015-03-29 ENCOUNTER — Ambulatory Visit: Payer: Commercial Managed Care - PPO

## 2015-03-29 ENCOUNTER — Ambulatory Visit
Admit: 2015-03-29 | Discharge: 2015-03-29 | Disposition: A | Discharge status: Home / Self Care | Payer: Commercial Managed Care - PPO | Attending: Radiation Oncology | Admitting: Radiation Oncology

## 2015-03-29 ENCOUNTER — Encounter: Payer: Self-pay | Admitting: *Deleted

## 2015-03-29 ENCOUNTER — Ambulatory Visit (HOSPITAL_BASED_OUTPATIENT_CLINIC_OR_DEPARTMENT_OTHER): Payer: Commercial Managed Care - PPO | Admitting: Hematology

## 2015-03-29 ENCOUNTER — Other Ambulatory Visit: Payer: Self-pay | Admitting: Radiation Therapy

## 2015-03-29 ENCOUNTER — Ambulatory Visit
Admit: 2015-03-29 | Discharge: 2015-03-29 | Disposition: A | Discharge status: Home / Self Care | Payer: Commercial Managed Care - PPO | Source: Ambulatory Visit | Attending: Radiation Oncology | Admitting: Radiation Oncology

## 2015-03-29 ENCOUNTER — Other Ambulatory Visit: Payer: Self-pay | Admitting: Radiation Oncology

## 2015-03-29 VITALS — BP 119/59 | HR 76 | Temp 97.7°F | Resp 18 | Wt 245.3 lb

## 2015-03-29 VITALS — BP 119/59 | HR 76 | Temp 98.2°F | Resp 18 | Ht 72.0 in | Wt 245.2 lb

## 2015-03-29 DIAGNOSIS — C787 Secondary malignant neoplasm of liver and intrahepatic bile duct: Secondary | ICD-10-CM | POA: Diagnosis not present

## 2015-03-29 DIAGNOSIS — C7931 Secondary malignant neoplasm of brain: Secondary | ICD-10-CM

## 2015-03-29 DIAGNOSIS — G939 Disorder of brain, unspecified: Secondary | ICD-10-CM | POA: Insufficient documentation

## 2015-03-29 DIAGNOSIS — C3492 Malignant neoplasm of unspecified part of left bronchus or lung: Secondary | ICD-10-CM | POA: Diagnosis not present

## 2015-03-29 DIAGNOSIS — C3490 Malignant neoplasm of unspecified part of unspecified bronchus or lung: Secondary | ICD-10-CM | POA: Insufficient documentation

## 2015-03-29 DIAGNOSIS — G9389 Other specified disorders of brain: Secondary | ICD-10-CM

## 2015-03-29 HISTORY — DX: Unspecified convulsions: R56.9

## 2015-03-29 HISTORY — DX: Other specified disorders of brain: G93.89

## 2015-03-29 MED ORDER — FOLIC ACID 1 MG PO TABS: 1.0000 mg | ORAL_TABLET | Freq: Every day | ORAL | Status: DC

## 2015-03-29 MED ORDER — CYANOCOBALAMIN 1000 MCG/ML IJ SOLN
INTRAMUSCULAR | Status: AC
  Filled 2015-03-29: qty 1

## 2015-03-29 MED ORDER — CYANOCOBALAMIN 1000 MCG/ML IJ SOLN
1000.0000 ug | Freq: Once | INTRAMUSCULAR | Status: AC
  Administered 2015-03-29: 1000 ug via INTRAMUSCULAR

## 2015-03-29 MED ORDER — DEXAMETHASONE 4 MG PO TABS: 4.0000 mg | ORAL_TABLET | Freq: Three times a day (TID) | ORAL | Status: DC

## 2015-03-29 MED ORDER — LORAZEPAM 1 MG PO TABS: ORAL_TABLET | ORAL | Status: DC

## 2015-03-29 NOTE — Progress Notes (Signed)
[facility]  [phone]) [phone] Fax:([phone] [place] Note   Patient Care Team: [name]. [name], MD as PCP - General (Internal Medicine) 03/29/2015  CHIEF COMPLAINTS/PURPOSE OF CONSULTATION:  Newly diagnosed metastatic lung cancer  HISTORY OF PRESENTING ILLNESS:  Walter Wilkins 59 y.o. male is here because of newly diagnosed metastatic lung cancer.  He has been in good health until 3 month ago, when he started having intermittent nausea, and gradual weight loss a total of 18 lbs since then, although some are intentional by cutting back diet. He denies any vomiting, abdominal pain or discomfort, or change of his bowel habit.  He presented with syncope, right after he noticed some titch of left face, and rolling over of his eyes. He will [name] on the ground and lost consciousness. This was witnessed by his wife and other people, 911 was called and paramedic staff underwent CPR for a total of 30 minutes. The cardiac monitor shows that he was in asystole. He was brought to [facility] and was admitted. He workup in the ambulance, and did not have any significant in neurology deficit afterwards. He was seen by cardiology in the neurology service, cardiac catheterization was negative for coronary artery disease. He underwent a CT of head which showed a 1.9 cm lesion in the left frontal lobe with surrounding edema. CT of the chest reviewed no PE, but significant infiltrative consolidation in the left lower lobe, it was felt to be aspiration pneumonia. CT of the abdomen reviewed multiple diffuse liver metastasis. He underwent a liver biopsy which showed adenocarcinoma.   He was a discharge to home. He has been doing well overall since hospital discharge. He has some residual pain from the rib fracture from CPR. He denies any cough, dyspnea, or other symptoms. He has mild fatigue and low appetite, which has been chronic since his gastric bypass surgery in 2011.     MEDICAL HISTORY:  Past Medical History  Diagnosis Date  . Diabetes mellitus without complication   . H/O gastric bypass     a. ~2011 at [facility].  . Acid reflux   . Seizures 03/23/15  . Brain mass 03/24/15    ct head - left frontal lobe mass consistent with a brain metastasis    SURGICAL HISTORY: Past Surgical History  Procedure Laterality Date  . Left heart catheterization with coronary angiogram N/A 03/23/2015    Procedure: LEFT HEART CATHETERIZATION WITH CORONARY ANGIOGRAM;  Surgeon: Burnell Blanks, MD;  Location: [facility] CATH LAB;  Service: Cardiovascular;  Laterality: N/A;  . Gastric bypass  2011  . Liver biopsy  03/27/15    SOCIAL HISTORY: History   Social History  . Marital Status: Unknown    Spouse Name: N/A  . Number of Children: 2  . Years of Education: N/A   Occupational History  . Barrister's clerk    Social History Main Topics  . Smoking status: Uses cigar   . Smokeless tobacco: Not on file  . Alcohol Use: 0.0 oz/week    0 Standard drinks or equivalent per week     Comment: Occasionally socially - once a week or less  . Drug Use: No  . Sexual Activity: Not on file   Other Topics Concern  . Not on file   Social History Narrative    FAMILY HISTORY: Family History  Problem Relation Age of Onset  . Valvular heart disease Father     H/o pig valve  . Sudden  death Neg Hx     No family history of sudden cardiac death  . Breast cancer Mother     ALLERGIES:  has No Known Allergies.  MEDICATIONS:  Current Outpatient Prescriptions  Medication Sig Dispense Refill  . ALPRAZolam (XANAX) 0.25 MG tablet Take 0.25 mg by mouth 2 (two) times daily as needed for anxiety. Last Refill 08/22/2014    . atorvastatin (LIPITOR) 40 MG tablet Take 40 mg by mouth daily. Last Refill 08/22/2014    . blood glucose meter kit and supplies KIT Dispense based on patient and insurance preference. Use up to four times daily as directed. (FOR ICD-9 250.00, 250.01). 1 each 0   . dexamethasone (DECADRON) 4 MG tablet Take 1 tablet (4 mg total) by mouth every 6 (six) hours. 120 tablet 0  . Insulin Glargine (LANTUS SOLOSTAR) 100 UNIT/ML Solostar Pen Inject 5 Units into the skin daily at 10 pm. 15 mL 0  . Insulin Pen Needle 32G X 4 MM MISC Use with insulin pen as directed to dispense insulin 100 each 1  . levETIRAcetam (KEPPRA) 500 MG tablet Take 1 tablet (500 mg total) by mouth 2 (two) times daily. 60 tablet 2  . lisinopril (PRINIVIL,ZESTRIL) 5 MG tablet Take 5 mg by mouth daily. Last Refill 08/10/2014    . metFORMIN (GLUCOPHAGE) 1000 MG tablet Take 1,000 mg by mouth 2 (two) times daily with a meal. Last refill 08/23/2014    . omeprazole (PRILOSEC) 20 MG capsule Take 20 mg by mouth daily. Last Refill 08/22/2014    . sertraline (ZOLOFT) 100 MG tablet Take 100 mg by mouth 2 (two) times daily. Last refill 08/22/2014     No current facility-administered medications for this visit.    REVIEW OF SYSTEMS:   Constitutional: Denies fevers, chills or abnormal night sweats, (+) weight loss  Eyes: Denies blurriness of vision, double vision or watery eyes Ears, nose, mouth, throat, and face: Denies mucositis or sore throat Respiratory: Denies cough, dyspnea or wheezes Cardiovascular: Denies palpitation, chest discomfort or lower extremity swelling Gastrointestinal:  Mild intermittent nausea, no heartburn or change in bowel habits Skin: Denies abnormal skin rashes Lymphatics: Denies new lymphadenopathy or easy bruising Neurological:Denies numbness, tingling or new weaknesses Behavioral/Psych: Mood is stable, no new changes  All other systems were reviewed with the patient and are negative.  PHYSICAL EXAMINATION: ECOG PERFORMANCE STATUS: 1 - Symptomatic but completely ambulatory  Filed Vitals:   03/29/15 1256  BP: 119/59  Pulse: 76  Temp: 98.2 F (36.8 C)  Resp: 18   Filed Weights   03/29/15 1256  Weight: 245 lb 3.2 oz (111.222 kg)    GENERAL:alert, no distress and  comfortable SKIN: skin color, texture, turgor are normal, no rashes or significant lesions EYES: normal, conjunctiva are pink and non-injected, sclera clear OROPHARYNX:no exudate, no erythema and lips, buccal mucosa, and tongue normal  NECK: supple, thyroid normal size, non-tender, without nodularity LYMPH:  no palpable lymphadenopathy in the cervical, axillary or inguinal LUNGS: clear to auscultation and percussion with normal breathing effort HEART: regular rate & rhythm and no murmurs and no lower extremity edema ABDOMEN:abdomen soft, non-tender and normal bowel sounds Musculoskeletal:no cyanosis of digits and no clubbing  PSYCH: alert & oriented x 3 with fluent speech NEURO: no focal motor/sensory deficits  LABORATORY DATA:  I have reviewed the data as listed Lab Results  Component Value Date   WBC 13.1* 03/27/2015   HGB 14.2 03/27/2015   HCT 42.3 03/27/2015   MCV 88.3 03/27/2015  PLT 229 03/27/2015    Recent Labs  03/24/15 0318 03/25/15 0237 03/27/15 0641  NA 137 134* 137  K 4.3 4.4 4.5  CL 103 100 100  CO2 29 27 28   GLUCOSE 126* 192* 220*  BUN 13 14 22   CREATININE 0.93 0.96 0.99  CALCIUM 8.5 8.9 8.8  GFRNONAA 89* 88* 87*  GFRAA >90 >90 >90  PROT 6.0  --   --   ALBUMIN 3.1*  --   --   AST 28  --   --   ALT 22  --   --   ALKPHOS 135*  --   --   BILITOT 0.5  --   --    PATHOLOGY REPORT: Liver, needle/core biopsy, right lobe 03/27/2015  - METASTATIC ADENOCARCINOMA, SEE COMMENT. Microscopic Comment The adenocarcinoma demonstrates the following immunophenotype Cytokeratin 7 - strong diffuse expression. Cytokeratin 20 - negative expression. TTF-1 - patchy moderate strong expression. Napsin A - strong diffuse expression. CDX-2 - negative expression. PSA - negative expression. Overall, the morphology and immunophenotype are that of metastatic adenocarcinoma, primary to lung. There is tumor available for ancillary tumor testing. The case is reviewed with Dr. Lyndon Code  who concurs. The case was discussed with Dr Burr Medico on 03/29/2015. (CRR:gt:ecj, 03/29/15)  RADIOGRAPHIC STUDIES: I have personally reviewed the radiological images as listed and agreed with the findings in the report.  Ct Head Wo Contrast 03/24/2015    IMPRESSION: 1.9 cm high left frontal lobe mass with minimal surrounding edema, most consistent with a brain metastasis given findings on concurrent chest CT. Further evaluation with brain MRI (without and with contrast) is recommended.  These results were called by telephone at the time of interpretation on 03/24/2015 at 12:25 pm to PA [facility] , who verbally acknowledged these results.   Electronically Signed   By: Logan Bores   On: 03/24/2015 12:32   Ct Angio Chest Pe W/cm &/or Wo Cm 03/24/2015   IMPRESSION: 1. Multiple low-density lesions in the liver worrisome for metastatic disease. 2. No pulmonary emboli. 3. Consolidative infiltrate in the left lower lobe with interstitial infiltrate in the left upper lobe. I suspect both of these represent aspiration pneumonitis. 4. Single enlarged left hilar lymph node. 5. Multiple bilateral anterior rib fractures most likely secondary to recent CPR.   Electronically Signed   By: Lorriane Shire M.D.   On: 03/24/2015 11:51   Mr Jeri Cos XM Contrast 03/25/2015    IMPRESSION: Solitary 17 x 15 mm hemorrhagic LEFT frontal lobe mass corresponding to CT abnormality with imaging characteristics of metastasis.  Otherwise unremarkable MRI of the brain with contrast for age.   Electronically Signed   By: Elon Alas   On: 03/25/2015 03:41   Ct Abdomen Pelvis W Contrast 03/25/2015    IMPRESSION: 1. Multiple low-density liver lesions highly suspicious for metastatic disease. Largest lesion lies in the right lobe measuring 3.4 cm in greatest transverse dimension. 2. No other evidence of metastatic disease below the diaphragm. No acute findings within the abdomen or pelvis. 3. Lung base findings that are unchanged from the previous  day's CT angiogram. Consolidation/atelectasis in left lower lobe. Probable adenopathy in the posterior inferior left mediastinum. Prior PET-CT evaluated a small left lower lobe nodule. This nodule was at the left lung base. This nodule is not visualized currently--it may be obscured by the lung consolidation. Consider repeat follow-up PET-CT to evaluate the source of presumed metastatic disease to the liver.   Electronically Signed   By:  Lajean Manes M.D.   On: 03/25/2015 12:30    ASSESSMENT & PLAN:  59 year old Caucasian male with minimal smoking history, obesity status post gastric bypass surgery, diabetes, presented with cardiac arrest and presumed seizure. Imaging study showed left lower lobe infiltrative consultation, left hilar adenopathy, diffuse liver metastasis and aortic lobe brain metastasis.  1. 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 Images were reviewed in person. -The liver biopsy immunostain were consistent with lung primary. Although the left lower lobe infiltrative consultation could be aspiration pneumonia, and more likely this presents a primary lung cancer. Patient did have a lung nodule in the left parietal lobe in the past, and he will bring his previous CT scan to me next visit. -I recommend a PET scan to give out other metastasis especially bone lesion -I recommend brain radiation, likely SBRT, first, he is scheduled to see Dr. Laurene Footman dissection on. -I recommend genetic test on his tissue. He agrees with [facility] one test. The request was sent to pathology today. -Based on the [facility] one test results, I'll see if he is a candidate for targeted therapy. If not I recommended systemic chemotherapy with carboplatin and Alimta. Due to the hemorrhagic brain lesion, I'll hold off Avastin for now.  -B12 injection today. Folic acid 1 mg daily starting next  week.  2. History of cardiac arrest -Likely related to his breathing metastasis and seizure -Cardiac cath was negative for coronary artery disease  3. Seizure -Continue Keppra   4.  diabetes  -Continue follow-up with primary care physician.   Follow-up: I'll see him back on 4/25, and decide on his systemic therapy based on his [facility] one test result.   All questions were answered. The patient knows to call the clinic with any problems, questions or concerns. I spent 55 minutes counseling the patient face to face. The total time spent in the appointment was 60 minutes and more than 50% was on counseling.     [name], MD 03/29/2015 1:57 PM

## 2015-03-29 NOTE — Addendum Note (Signed)
Encounter addended by: Eppie Gibson, MD on: 03/29/2015  5:26 PM<BR>     Documentation filed: Follow-up Section, LOS Section

## 2015-03-29 NOTE — Progress Notes (Signed)
Please see the Nurse Progress Note in the MD Initial Consult Encounter for this patient. 

## 2015-03-29 NOTE — Progress Notes (Signed)
[place] Psychosocial Distress Screening Clinical Social Work  Clinical Social Work was referred by distress screening protocol.  The patient scored a 10 on the Psychosocial Distress Thermometer which indicates severe distress. Clinical Social Worker met with pt, his wife, brother and sister in law prior to MD appointment to assess for distress and other psychosocial needs. Pt has experienced traumatic event and hospitalization and is still "coming to grips" with his diagnosis. He has great support from family and friends. He shared he has had issues with depression and anxiety for several years and was started on xanax after having anxiety related to loss of his job about six months ago. He feels his anxiety has been more of an issue than his depression and is appropriate, but his biggest concern. We talked about common emotions given his situation and also discussed coping techniques. Pt tearful today, quiet and his wife shared many of his concerns. Pt was eager for a new job, but now is not sure what to do. CSW discussed SS disability and how to start the process. They are also caring for their teenage grandsons and this is a source of stress for both. CSW reviewed role of CSW and the Pt and Family Support Team. CSW encouraged him to attend Brain Support Group tomorrow and he will consider. CSW will also call him in the next few days to check in with pt alone.   ONCBCN DISTRESS SCREENING 03/29/2015  Screening Type Initial Screening  Distress experienced in past week (1-10) 10  Practical problem type Work/school  Family Problem type Other (comment)  Emotional problem type Depression;Nervousness/Anxiety  Spiritual/Religous concerns type Loss of sense of purpose  Physical Problem type Loss of appetitie;Talking  Other "nervousness, anxiety" listed as most distressing; may call     Clinical Social Worker follow up needed: Yes.    If yes, follow up plan: See above Loren Racer, [place]  Worker [place]  [facility] Phone: [phone] Fax: [phone]

## 2015-03-29 NOTE — Progress Notes (Signed)
Radiation Oncology         [phone] ________________________________  Initial outpatient Consultation  Name: Walter Wilkins MRN: [id number]  Date: 03/29/2015  DOB: 20-Nov-1956  PI:RJJOAC,ZYSA, MD  Tat, Shanon Brow, MD   REFERRING PHYSICIAN: Orson Eva, MD  DIAGNOSIS: Adenocarcinoma of the lung, metastatic to brain   ICD-9-CM ICD-10-CM   1. Brain metastasis 198.3 C79.31 LORazepam (ATIVAN) 1 MG tablet  2. Brain mass 348.9 G93.9 Ambulatory referral to Social Work     HISTORY OF PRESENT ILLNESS::Walter Wilkins is a 59 y.o. male who  presented with a grand mal seizure.  He went into asystole. He required CPR and was revived. This occurred [place] the [employer] during a game  He was admitted to [facility]. Imaging included CT of the chest/abdomen and brain MRI.  He was found to have multiple liver lesions. Biopsy of the liver revealed adenocarcinoma c/w lung primary.  The lung has LLL changes c/w consolidation/atelectasis in left lower lobe. Probable adenopathy in the posterior inferior left mediastinum. Also, a small left lower lobe nodule.     MRI of the brain demonstrated a single left frontal hemorrhagic mass c/w metastasis, 17x39m. Mild surrounding edema.  He denies residual neurologic deficits. He is on Keppra and Decadron. He met with Dr FBurr Medicotoday, PET is pending. Systemic therapy plans pending future stains of the tissue biopsy specimen.  He has had dull frontal HAs x 236mond a little nausea x 3 months.  PREVIOUS RADIATION THERAPY: No  PAST MEDICAL HISTORY:  has a past medical history of Diabetes mellitus without complication; H/O gastric bypass; Acid reflux; Seizures (03/23/15); and Brain mass (03/24/15).    PAST SURGICAL HISTORY: Past Surgical History  Procedure Laterality Date  . Left heart catheterization with coronary angiogram N/A 03/23/2015    Procedure: LEFT HEART CATHETERIZATION WITH CORONARY ANGIOGRAM;  Surgeon: ChBurnell BlanksMD;  [facility];   Service: Cardiovascular;  Laterality: N/A;  . Gastric bypass  2011  . Liver biopsy  03/27/15  . Appendectomy    . Cholecystectomy    . Right knee osroscopy Right     FAMILY HISTORY: family history includes Breast cancer in his mother; Cancer in his cousin, father, and maternal uncle; Cancer (age of onset: 4240in his brother; Cancer (age of onset: 7658in his mother; Cancer (age of onset: 9014in his maternal uncle; Valvular heart disease in his father. There is no history of Sudden death.  SOCIAL HISTORY:  reports that he has been smoking Cigars.  He does not have any smokeless tobacco history on file. He reports that he drinks alcohol. He reports that he does not use illicit drugs.  ALLERGIES: Review of patient's allergies indicates no known allergies.  MEDICATIONS:  Current Outpatient Prescriptions  Medication Sig Dispense Refill  . ALPRAZolam (XANAX) 0.25 MG tablet Take 0.25 mg by mouth 2 (two) times daily as needed for anxiety. Last Refill 08/22/2014    . atorvastatin (LIPITOR) 40 MG tablet Take 40 mg by mouth daily. Last Refill 08/22/2014    . blood glucose meter kit and supplies KIT Dispense based on patient and insurance preference. Use up to four times daily as directed. (FOR ICD-9 250.00, 250.01). 1 each 0  . dexamethasone (DECADRON) 4 MG tablet Take 1 tablet (4 mg total) by mouth every 6 (six) hours. 12630ablet 0  . folic acid (FOLVITE) 1 MG tablet Take 1 tablet (1 mg total) by mouth daily. 30 tablet 1  . Insulin Glargine (  LANTUS SOLOSTAR) 100 UNIT/ML Solostar Pen Inject 5 Units into the skin daily at 10 pm. 15 mL 0  . Insulin Pen Needle 32G X 4 MM MISC Use with insulin pen as directed to dispense insulin 100 each 1  . levETIRAcetam (KEPPRA) 500 MG tablet Take 1 tablet (500 mg total) by mouth 2 (two) times daily. 60 tablet 2  . lisinopril (PRINIVIL,ZESTRIL) 5 MG tablet Take 5 mg by mouth daily. Last Refill 08/10/2014    . metFORMIN (GLUCOPHAGE) 1000 MG tablet Take 1,000 mg by mouth 2  (two) times daily with a meal. Last refill 08/23/2014    . omeprazole (PRILOSEC) 20 MG capsule Take 20 mg by mouth daily. Last Refill 08/22/2014    . sertraline (ZOLOFT) 100 MG tablet Take 100 mg by mouth 2 (two) times daily. Last refill 08/22/2014    . LORazepam (ATIVAN) 1 MG tablet Take 1 tablet 30 min before MRI or wearing radiotherapy mask 5 tablet 0   No current facility-administered medications for this encounter.    REVIEW OF SYSTEMS:     Pertinent items are noted in HPI.   PHYSICAL EXAM:  weight is 245 lb 4.8 oz (111.267 kg). His oral temperature is 97.7 F (36.5 C). His blood pressure is 119/59 and his pulse is 76. His respiration is 18.   General: Alert and oriented, in no acute distress HEENT: Head is normocephalic. Extraocular movements are intact. Oropharynx is clear. Neck: Neck is supple, no palpable cervical or supraclavicular lymphadenopathy. Heart: Regular in rate and rhythm with no murmurs, rubs, or gallops. Chest: Clear to auscultation bilaterally, with no rhonchi, wheezes, or rales. Abdomen: Soft, nontender, nondistended, with no rigidity or guarding. Extremities: No cyanosis or edema. Lymphatics: see Neck Exam Skin: No concerning lesions. Musculoskeletal: symmetric strength and muscle tone throughout. Neurologic: Cranial nerves II through XII are grossly intact. No obvious focalities. Speech is fluent. Coordination is intact. Psychiatric: Judgment and insight are intact. Affect is appropriate.     LABORATORY DATA:  Lab Results  Component Value Date   WBC 13.1* 03/27/2015   HGB 14.2 03/27/2015   HCT 42.3 03/27/2015   MCV 88.3 03/27/2015   PLT 229 03/27/2015   CMP     Component Value Date/Time   NA 137 03/27/2015 0641   K 4.5 03/27/2015 0641   CL 100 03/27/2015 0641   CO2 28 03/27/2015 0641   GLUCOSE 220* 03/27/2015 0641   BUN 22 03/27/2015 0641   CREATININE 0.99 03/27/2015 0641   CALCIUM 8.8 03/27/2015 0641   PROT 6.0 03/24/2015 0318   ALBUMIN 3.1*  03/24/2015 0318   AST 28 03/24/2015 0318   ALT 22 03/24/2015 0318   ALKPHOS 135* 03/24/2015 0318   BILITOT 0.5 03/24/2015 0318   GFRNONAA 87* 03/27/2015 0641   GFRAA >90 03/27/2015 0641         RADIOGRAPHY: Ct Head Wo Contrast  03/24/2015   CLINICAL DATA:  Syncopal episode/ cardiac arrest yesterday.  EXAM: CT HEAD WITHOUT CONTRAST  TECHNIQUE: Contiguous axial images were obtained from the base of the skull through the vertex without intravenous contrast.  COMPARISON:  None.  FINDINGS: There is a 1.9 cm mildly hyperattenuating cortical lesion in the high left frontal lobe at the skull vertex with minimal surrounding edema. The appearance is most consistent with a mass rather primary parenchymal hemorrhage. No mass, intracranial hemorrhage, or edema is identified elsewhere on this unenhanced study. There is no evidence of acute infarct, midline shift, or extra-axial fluid collection. Ventricles and  sulci are normal.  Orbits are unremarkable. The visualized paranasal sinuses are clear. No significant mastoid effusion is seen. No destructive skull lesions are identified.  IMPRESSION: 1.9 cm high left frontal lobe mass with minimal surrounding edema, most consistent with a brain metastasis given findings on concurrent chest CT. Further evaluation with brain MRI (without and with contrast) is recommended.  These results were called by telephone at the time of interpretation on 03/24/2015 at 12:25 pm to [facility] , who verbally acknowledged these results.   Electronically Signed   By: Logan Bores   On: 03/24/2015 12:32   Ct Angio Chest Pe W/cm &/or Wo Cm  03/24/2015   CLINICAL DATA:  Cardiac arrest.  EXAM: CT ANGIOGRAPHY CHEST WITH CONTRAST  TECHNIQUE: Multidetector CT imaging of the chest was performed using the standard protocol during bolus administration of intravenous contrast. Multiplanar CT image reconstructions and MIPs were obtained to evaluate the vascular anatomy.  CONTRAST:  41m OMNIPAQUE  IOHEXOL 350 MG/ML SOLN  COMPARISON:  Chest x-ray dated 03/23/2015 and chest CT dated 02/25/2013  FINDINGS: There fractures of the left sixth seventh and eighth ribs anteriorly and of the right and of the right fourth through eighth ribs anteriorly secondary to recent CPR.  There are no pulmonary emboli. There is consolidation of most of the left lower lobe and there is a streaky interstitial infiltrate in the posterior lateral aspect of the left upper lobe. These findings probably represent aspiration pneumonitis. The right lung is clear. There is 1 enlarged left hilar lymph node, 16 mm in diameter on image 113 of series 407.  Images of the upper abdomen demonstrate multiple poorly defined low-density lesions in the liver worrisome for metastatic disease. There is a 3.4 cm lesion in the right lobe on image 99 of series 401. There are least 2 lesions in the left left lobe and at least 7 lesions in the right lobe.  The patient has had gastric bypass surgery and cholecystectomy.  Review of the MIP images confirms the above findings.  IMPRESSION: 1. Multiple low-density lesions in the liver worrisome for metastatic disease. 2. No pulmonary emboli. 3. Consolidative infiltrate in the left lower lobe with interstitial infiltrate in the left upper lobe. I suspect both of these represent aspiration pneumonitis. 4. Single enlarged left hilar lymph node. 5. Multiple bilateral anterior rib fractures most likely secondary to recent CPR.   Electronically Signed   By: JLorriane ShireM.D.   On: 03/24/2015 11:51   Mr BJeri CosWUKContrast  03/25/2015   CLINICAL DATA:  Seizure while at baseball game, patient does not recall event. No history of seizures. Suspected metastatic disease on recent imaging.  EXAM: MRI HEAD WITHOUT AND WITH CONTRAST  TECHNIQUE: Multiplanar, multiecho pulse sequences of the brain and surrounding structures were obtained without and with intravenous contrast.  CONTRAST:  278mMULTIHANCE GADOBENATE DIMEGLUMINE  529 MG/ML IV SOLN  COMPARISON:  CT of the head March 24, 2015  FINDINGS: Focal susceptibility artifact and slight reduced diffusion associated with low low T1, low T2 homogeneously enhancing 17 x 15 mm LEFT cortical based frontal convexity mass with mild surrounding vasogenic edema. No additional parenchymal masses. LEFT frontal developmental venous anomaly.  No reduced diffusion to suggest acute ischemia. Ventricles and sulci are are overall normal for patient's age. A few scattered subcentimeter white matter FLAIR T2 hyperintensities may reflect chronic small vessel ischemic disease, within normal range for patient's age. No midline shift or significant mass effect. No abnormal  extra-axial fluid collections nor leptomeningeal enhancement. Normal major intracranial vascular flow voids seen at the skull base.  Mild motion degraded coronal T2 with relatively symmetric appearance of the hippocampi with normal morphology and signal characteristics.  Ocular globes and orbital contents are unremarkable. Paranasal sinuses and mastoid air cells are well aerated. No abnormal sellar expansion. No cerebellar tonsillar ectopia. No suspicious calvarial bone marrow signal.  IMPRESSION: Solitary 17 x 15 mm hemorrhagic LEFT frontal lobe mass corresponding to CT abnormality with imaging characteristics of metastasis.  Otherwise unremarkable MRI of the brain with contrast for age.   Electronically Signed   By: Elon Alas   On: 03/25/2015 03:41   Ct Abdomen Pelvis W Contrast  03/25/2015   CLINICAL DATA:  LIVER LESION NOTED ON ANGIO CHEST FROM 03-24-15, DIABETIC, 15/1.51/ followup exam.  EXAM: CT ABDOMEN AND PELVIS WITH CONTRAST  TECHNIQUE: Multidetector CT imaging of the abdomen and pelvis was performed using the standard protocol following bolus administration of intravenous contrast.  CONTRAST:  145m OMNIPAQUE IOHEXOL 300 MG/ML  SOLN  COMPARISON:  CTA chest, 03/24/2015.  PET-CT, 03/04/2013.  FINDINGS: There are multiple  low-density liver masses. The margins of views are somewhat ill-defined. There is mild heterogeneity of the internal attenuation with reticular areas of apparent enhancement. These are not cysts. Largest mass arises from the anterior segment of the right lobe measuring 3.4 cm x 3.3 cm transversely. A mass measuring 2 cm from the inferior margin of the lateral segment of the left lobe bulges the inferior liver contour.  As noted on the recent chest CT, there is consolidation in the left lower lobe and at the posterior inferior base of the left upper lobe. Volume loss in the left lower lobe with posterior deviation of the oblique fissure suggests that at least a component of this opacity is atelectasis. There is some central heterogeneous attenuating soft tissue lying anterior to the thoracic aorta and to the left of the esophagus. This may reflect adenopathy. It measures approximately 2.3 cm x 2.4 cm in size. There is a minimal left pleural effusion and trace amount of right pleural fluid. Milder dependent atelectasis is noted in the posterior right lower lobe.  There changes from previous gastric surgery. No gastric mass is seen.  A portion of the transverse colon enters a paraumbilical midline hernia. There is no evidence of obstruction, incarceration or strangulation. There are few colonic diverticula but no evidence of diverticulitis. Small bowel unremarkable.  Gallbladder surgically absent.  No bile duct dilation.  Spleen, pancreas, adrenal glands, kidneys, ureters, bladder: Unremarkable.  No pathologically enlarged lymph nodes.  No ascites.  No osteoblastic or osteolytic lesions.  IMPRESSION: 1. Multiple low-density liver lesions highly suspicious for metastatic disease. Largest lesion lies in the right lobe measuring 3.4 cm in greatest transverse dimension. 2. No other evidence of metastatic disease below the diaphragm. No acute findings within the abdomen or pelvis. 3. Lung base findings that are unchanged  from the previous day's CT angiogram. Consolidation/atelectasis in left lower lobe. Probable adenopathy in the posterior inferior left mediastinum. Prior PET-CT evaluated a small left lower lobe nodule. This nodule was at the left lung base. This nodule is not visualized currently--it may be obscured by the lung consolidation. Consider repeat follow-up PET-CT to evaluate the source of presumed metastatic disease to the liver.   Electronically Signed   By: DLajean ManesM.D.   On: 03/25/2015 12:30   [place]oreaBiopsy  03/27/2015   CLINICAL DATA:  59year-old with  multiple liver lesions. Tissue diagnosis is needed.  EXAM: ULTRASOUND-GUIDED LIVER LESION BIOPSY  Physician: Stephan Minister. Anselm Pancoast, MD  FLUOROSCOPY TIME:  None  MEDICATIONS: 1.5 mg versed, 75 mcg fentanyl. A radiology nurse monitored the patient for moderate sedation.  ANESTHESIA/SEDATION: Moderate sedation time: 12 minutes  PROCEDURE: The procedure was explained to the patient. The risks and benefits of the procedure were discussed and the patient's questions were addressed. Informed consent was obtained from the patient. Liver was evaluated with ultrasound. A lesion in the right hepatic lobe was selected for biopsy. The right side of the abdomen was prepped and draped in sterile fashion. Skin and liver capsule were anesthetized with 1% lidocaine. Using ultrasound guidance, a 17 gauge needle was directed into the lesion. Three core biopsies were obtained with an 18 gauge core device. Specimens placed in formalin. 17 gauge needle was removed without complication. Bandage placed over the puncture site.  FINDINGS: Round hypoechoic nodule in the right hepatic lobe. Needle position confirmed within this lesion. No significant bleeding following the core biopsies.  Estimated blood loss: Minimal  COMPLICATIONS: None  IMPRESSION: Ultrasound-guided core biopsies of a right hepatic lesion.   Electronically Signed   By: Markus Daft M.D.   On: 03/27/2015 11:29   Dg Chest Portable 1  View  03/23/2015   CLINICAL DATA:  Syncope at baseball game with CPR.  EXAM: PORTABLE CHEST - 1 VIEW  COMPARISON:  None.  FINDINGS: There is airspace opacity throughout the left lung. Heart size and upper mediastinal contours are within normal limits for technique. No evidence of effusion or air leak. No evidence of rib fracture post CPR.  IMPRESSION: Airspace disease throughout the left lung, likely aspiration given the history. CPR related lung contusion considered unlikely given no visible rib fracture.   Electronically Signed   By: Monte Fantasia M.D.   On: 03/23/2015 15:03   Dg Knee Left Port  03/24/2015   CLINICAL DATA:  Fall on concrete at baseball game yesterday. Landing on left knee. Unable to bear weight.  EXAM: PORTABLE LEFT KNEE - 1-2 VIEW  COMPARISON:  None.  FINDINGS: Small joint effusion. No acute bony abnormality. No fracture, subluxation or dislocation. Soft tissues are intact.  IMPRESSION: Small joint effusion.  No acute bony abnormality.   Electronically Signed   By: Rolm Baptise M.D.   On: 03/24/2015 10:15      IMPRESSION/PLAN: This is a very pleasant 59 year old man with metastatic disease to the brain.  I had a lengthy discussion with the patient and his family after reviewing their MRI results with them.  We spoke about whole brain radiotherapy versus stereotactic radiosurgery vs surgical resection to the brain. We spoke about the differing risks benefits and side effects of these treatments. During part of our discussion, we spoke about the hair loss, fatigue and cognitive effects that can result from whole brain radiotherapy.  Additionally, we spoke about radionecrosis that can result from stereotactic radiosurgery. I explained that whole brain radiotherapy is more comprehensive and therefore can decrease the chance of recurrences elsewhere in the brain, while stereotactic radiosurgery only treats the areas of gross disease while sparing the rest of the brain parenchyma. He would  like to avoid surgical resection if possible.  After lengthy discussion, the patient would like to proceed with stereotactic brain radiosurgery to their metastatic brain disease. We will obtain a Brain MRI that is 3T SRS protocol to verify if there are any other small tumors that need to  be addressed.   CT simulation then will take place next week and treatment soon thereafter. We will collaborate with neurosurgery.  Refer to social work for anxiety.  Ativan for anxiety during MRI and treatment.  Taper Decadron to 39m TID for now.   __________________________________________   SEppie Gibson MD

## 2015-03-29 NOTE — Addendum Note (Signed)
Encounter addended by: Minna Antis, RN on: 03/29/2015  5:29 PM<BR>     Documentation filed: Charges VN

## 2015-03-30 ENCOUNTER — Telehealth: Payer: Self-pay | Admitting: Hematology

## 2015-03-30 ENCOUNTER — Ambulatory Visit
Admission: RE | Admit: 2015-03-30 | Discharge: 2015-03-30 | Disposition: A | Discharge status: Home / Self Care | Payer: Commercial Managed Care - PPO | Source: Ambulatory Visit | Attending: Radiation Oncology | Admitting: Radiation Oncology

## 2015-03-30 ENCOUNTER — Encounter: Payer: Self-pay | Admitting: Radiation Oncology

## 2015-03-30 ENCOUNTER — Other Ambulatory Visit: Payer: Self-pay | Admitting: Radiation Therapy

## 2015-03-30 ENCOUNTER — Telehealth: Payer: Self-pay | Admitting: *Deleted

## 2015-03-30 DIAGNOSIS — C7931 Secondary malignant neoplasm of brain: Secondary | ICD-10-CM

## 2015-03-30 MED ORDER — GADOBENATE DIMEGLUMINE 529 MG/ML IV SOLN
20.0000 mL | Freq: Once | INTRAVENOUS | Status: AC | PRN
  Administered 2015-03-30: 20 mL via INTRAVENOUS

## 2015-03-30 NOTE — Progress Notes (Signed)
Per Rose w/[facility], npa required for MRI Brain [phone].

## 2015-03-30 NOTE — Telephone Encounter (Signed)
Lft msg for pt confirming labs/ov r/s due to provider schedule also mailed schedule to pt... KJ

## 2015-03-30 NOTE — Telephone Encounter (Signed)
CALLED PATIENT TO INFORM OF LAB ON 04-05-15 @ 9 AM, SPOKE WITH PATIENT AND HE IS AWARE OF THIS APPT.

## 2015-03-31 ENCOUNTER — Encounter: Payer: Self-pay | Admitting: *Deleted

## 2015-03-31 NOTE — Progress Notes (Signed)
[place] [facility]  Clinical Social Work phoned pt to check in after appointments the other day. Pt shared his latest plan and appears to have a good understanding. Pt sounded flat over the phone, but engaged in conversation and shared he was having a pretty good day. He denied current needs, eager for a plan. He is open to CSW following and will continue to consider joining [facility] next month. \  Clinical Social Work interventions: Supportive listening  Loren Racer, [place] Worker [place]  [place] Phone: [phone] Fax: [phone]

## 2015-04-03 ENCOUNTER — Ambulatory Visit
Admission: RE | Admit: 2015-04-03 | Discharge: 2015-04-03 | Disposition: A | Discharge status: Home / Self Care | Payer: Commercial Managed Care - PPO | Source: Ambulatory Visit | Attending: Radiation Oncology | Admitting: Radiation Oncology

## 2015-04-03 ENCOUNTER — Encounter: Payer: Self-pay | Admitting: *Deleted

## 2015-04-03 ENCOUNTER — Ambulatory Visit
Admission: RE | Admit: 2015-04-03 | Payer: Commercial Managed Care - PPO | Source: Ambulatory Visit | Admitting: Radiation Oncology

## 2015-04-03 DIAGNOSIS — C7931 Secondary malignant neoplasm of brain: Secondary | ICD-10-CM

## 2015-04-03 MED ORDER — SODIUM CHLORIDE 0.9 % IJ SOLN
10.0000 mL | Freq: Once | INTRAMUSCULAR | Status: AC
  Administered 2015-04-03: 10 mL via INTRAVENOUS

## 2015-04-03 NOTE — Progress Notes (Addendum)
Patient in nursing for IV start, ct sim today. He denies pain, nausea,but he does have constant dull HA which is unchanged since last visit in this office.  IV d/c per Murlean Iba, RN following ct sim.

## 2015-04-03 NOTE — Progress Notes (Signed)
  Radiation Oncology         ([phone]) [phone] ________________________________  Name: Walter Wilkins MRN: [id number]  Date: 04/03/2015  DOB: August 28, 1956  SIMULATION AND TREATMENT PLANNING NOTE  DIAGNOSIS:     ICD-9-CM ICD-10-CM   1. Brain metastasis 198.3 C79.31      NARRATIVE:  The patient was brought to the [facility].  Identity was confirmed.  All relevant records and images related to the planned course of therapy were reviewed.  The patient freely provided informed written consent to proceed with treatment after reviewing the details related to the planned course of therapy. The consent form was witnessed and verified by the simulation staff. Intravenous access was established for contrast administration. Then, the patient was set-up in a stable reproducible supine position for radiation therapy.  A relocatable thermoplastic stereotactic head frame was fabricated for precise immobilization.  CT images were obtained.  Surface markings were placed.  The CT images were loaded into the planning software and fused with the patient's targeting MRI scan.  Then the target and avoidance structures were contoured.  Treatment planning then occurred.  The radiation prescription was entered and confirmed.  I have requested 3D planning  I have requested a DVH of the following structures: Brain stem, brain, left eye, right ete, lenses, optic chiasm, target volumes, uninvolved brain, and normal tissue.    PLAN:  The patient will receive 18 Gy in 1 fraction to the left superior frontal 81mm brain lesion with SRS.  -----------------------------------  Eppie Gibson, MD

## 2015-04-03 NOTE — [facility] Oncology Navigator Note (Unsigned)
Per request Dr. Burr Medico I called pathology to request [facility].  Walter Wilkins states, there is enough tissue to go to [facility]. Tissue will be sent today.

## 2015-04-05 ENCOUNTER — Telehealth: Payer: Self-pay | Admitting: *Deleted

## 2015-04-05 ENCOUNTER — Ambulatory Visit
Admission: RE | Admit: 2015-04-05 | Discharge: 2015-04-05 | Disposition: A | Discharge status: Home / Self Care | Payer: Commercial Managed Care - PPO | Source: Ambulatory Visit | Attending: Radiation Oncology | Admitting: Radiation Oncology

## 2015-04-05 DIAGNOSIS — C7931 Secondary malignant neoplasm of brain: Secondary | ICD-10-CM

## 2015-04-05 LAB — BUN AND CREATININE (CC13)
BUN: 25.7 mg/dL (ref 7.0–26.0)
Creatinine: 0.9 mg/dL (ref 0.7–1.3)
EGFR: >90 mL/min/{1.73_m2} (ref >90)

## 2015-04-05 NOTE — Telephone Encounter (Signed)
Called patient and left voice mail re: labs from this morning are normal. He may resume taking his Metformin today. Repeated the message and left this caller's name and direct number for any questions.

## 2015-04-06 DIAGNOSIS — C7931 Secondary malignant neoplasm of brain: Secondary | ICD-10-CM | POA: Diagnosis not present

## 2015-04-07 ENCOUNTER — Encounter (HOSPITAL_COMMUNITY)
Admission: RE | Admit: 2015-04-07 | Discharge: 2015-04-07 | Disposition: A | Discharge status: Home / Self Care | Payer: Commercial Managed Care - PPO | Source: Ambulatory Visit | Attending: Hematology | Admitting: Hematology

## 2015-04-07 DIAGNOSIS — C3490 Malignant neoplasm of unspecified part of unspecified bronchus or lung: Secondary | ICD-10-CM | POA: Insufficient documentation

## 2015-04-07 DIAGNOSIS — C799 Secondary malignant neoplasm of unspecified site: Secondary | ICD-10-CM | POA: Insufficient documentation

## 2015-04-07 LAB — GLUCOSE, CAPILLARY: Glucose-Capillary: 185 mg/dL — ABNORMAL HIGH (ref 70–99)

## 2015-04-07 MED ORDER — FLUDEOXYGLUCOSE F - 18 (FDG) INJECTION
11.2200 | Freq: Once | INTRAVENOUS | Status: AC | PRN
  Administered 2015-04-07: 11.22 via INTRAVENOUS

## 2015-04-10 ENCOUNTER — Inpatient Hospital Stay
Admission: RE | Admit: 2015-04-10 | Discharge: 2015-04-10 | Disposition: A | Discharge status: Home / Self Care | Payer: Self-pay | Source: Ambulatory Visit | Attending: Hematology | Admitting: Hematology

## 2015-04-10 ENCOUNTER — Other Ambulatory Visit: Payer: Self-pay | Admitting: Hematology

## 2015-04-10 ENCOUNTER — Encounter: Payer: Self-pay | Admitting: Radiation Oncology

## 2015-04-10 ENCOUNTER — Ambulatory Visit
Admission: RE | Admit: 2015-04-10 | Discharge: 2015-04-10 | Disposition: A | Discharge status: Home / Self Care | Payer: Commercial Managed Care - PPO | Source: Ambulatory Visit | Attending: Radiation Oncology | Admitting: Radiation Oncology

## 2015-04-10 ENCOUNTER — Other Ambulatory Visit: Payer: Commercial Managed Care - PPO

## 2015-04-10 VITALS — BP 129/73 | HR 46 | Temp 97.9°F | Resp 16

## 2015-04-10 DIAGNOSIS — IMO0002 Reserved for concepts with insufficient information to code with codable children: Secondary | ICD-10-CM

## 2015-04-10 DIAGNOSIS — R229 Localized swelling, mass and lump, unspecified: Principal | ICD-10-CM

## 2015-04-10 DIAGNOSIS — C7931 Secondary malignant neoplasm of brain: Secondary | ICD-10-CM

## 2015-04-10 DIAGNOSIS — G9389 Other specified disorders of brain: Secondary | ICD-10-CM

## 2015-04-10 MED ORDER — DEXAMETHASONE 4 MG PO TABS: 4.0000 mg | ORAL_TABLET | Freq: Three times a day (TID) | ORAL | Status: DC

## 2015-04-10 NOTE — Progress Notes (Signed)
Thirty minutes nurse monitoring complete. Patient without complaints. Denies headache, dizziness, nausea, diplopia or ringing in the ears. Encouraged to continue keppra and decadron as directed. Taking decadron 4 mg tid and keppra 500 mg bid. Vitals stable. Denies pain. Understands to contact staff with needs.

## 2015-04-10 NOTE — Progress Notes (Signed)
4.18.16:  Rec'd [employer] claim form from patient.  Forward to RN for processing.

## 2015-04-10 NOTE — Progress Notes (Signed)
  Radiation Oncology         [id number]) [phone] ________________________________  Stereotactic Treatment Procedure Note  Name: Walter Wilkins MRN: [id number]  Date: 04/10/2015  DOB: Nov 15, 1956  SPECIAL TREATMENT PROCEDURE  3D TREATMENT PLANNING AND DOSIMETRY:  The patient's radiation plan was reviewed and approved by neurosurgery and radiation oncology prior to treatment.  It showed 3-dimensional radiation distributions overlaid onto the planning CT/MRI image set.  The [facility] for the target structures as well as the organs at risk were reviewed. The documentation of the 3D plan and dosimetry are filed in the radiation oncology EMR.  NARRATIVE:  Walter Wilkins was brought to the TrueBeam stereotactic radiation treatment machine and placed supine on the CT couch. The head frame was applied, and the patient was set up for stereotactic radiosurgery.  Neurosurgery was present for the set-up and delivery.  SIMULATION VERIFICATION:  In the couch zero-angle position, the patient underwent Exactrac imaging using the Brainlab system with orthogonal KV images.  These were carefully aligned and repeated to confirm treatment position for each of the isocenters.  The Exactrac snap film verification was repeated at each couch angle.  SPECIAL TREATMENT PROCEDURE: Walter Wilkins received stereotactic radiosurgery to the following targets: Left superior frontal 13mm target was treated using 4 Rapid Arc VMAT Beams to a prescription dose of 18 Gy.  ExacTrac Snap verification was performed for each couch angle.   This constitutes a special treatment procedure due to the ablative dose delivered and the technical nature of treatment.  This highly technical modality of treatment ensures that the ablative dose is centered on the patient's tumor while sparing normal tissues from excessive dose and risk of detrimental effects.  STEREOTACTIC TREATMENT MANAGEMENT:  Following delivery, the patient was transported to nursing in stable condition  and monitored for possible acute effects.  Vital signs were recorded BP 129/73 mmHg  Pulse 46  Temp(Src) 97.9 F (36.6 C) (Oral)  Resp 16  SpO2 100%. The patient tolerated treatment without significant acute effects, and was discharged to home in stable condition.   He had no oral thrush on exam.   PLAN: Follow-up in one month. Taper Decadron to 4mg  BID on April 25, and 4mg  daily on May 9th. Discuss further taper with Dr Isidore Moos at appointment in May. ________________________________   Eppie Gibson, MD

## 2015-04-13 ENCOUNTER — Telehealth: Payer: Self-pay | Admitting: *Deleted

## 2015-04-13 ENCOUNTER — Encounter (HOSPITAL_COMMUNITY): Payer: Self-pay

## 2015-04-13 ENCOUNTER — Encounter: Payer: Self-pay | Admitting: Radiation Oncology

## 2015-04-13 MED ORDER — CRIZOTINIB 250 MG PO CAPS: 250.0000 mg | ORAL_CAPSULE | Freq: Two times a day (BID) | ORAL | Status: DC

## 2015-04-13 NOTE — Progress Notes (Signed)
[employer] claim form, path and RO bill faxed in for patient to [phone]

## 2015-04-13 NOTE — Telephone Encounter (Signed)
Called pt & informed that he will not need IV chemo but Dr. Burr Medico states that she will give him crixotinib (Xalkori) capsules to take bid.  Informed that this script will be sent to [facility] & she will discuss this drug at next visit & hopefully they will have time to research & get this drug for him by next week.  Pt expressed understanding & appreciation of information.

## 2015-04-14 ENCOUNTER — Encounter: Payer: Self-pay | Admitting: Hematology

## 2015-04-14 NOTE — Progress Notes (Signed)
I sent scrip for xalkori to [facility] [phone]

## 2015-04-14 NOTE — Progress Notes (Signed)
  Radiation Oncology         [phone]) [phone] ________________________________  Name: Walter Wilkins MRN: [id number]  Date: 04/10/2015  DOB: 1956/08/12  End of Treatment Note  Diagnosis:    Adenocarcinoma of the lung, metastatic to brain   Indication for treatment:  palliative       Radiation treatment dates:   04/10/2015  Site/dose:   Left superior frontal 50m target was treated to a prescription dose of 18 Gy.     Beams/energy:  SRS using 4 Rapid Arc VMAT Beams / 6MV FFF  Narrative: The patient tolerated radiation treatment relatively well.      Plan: The patient has completed radiation treatment. The patient will return to radiation oncology clinic for routine followup in one month. I advised them to call or return sooner if they have any questions or concerns related to their recovery or treatment.  -----------------------------------  SEppie Gibson MD

## 2015-04-15 NOTE — Addendum Note (Signed)
Encounter addended by: Erline Levine, MD on: 04/15/2015  4:09 PM<BR>     Documentation filed: Clinical Notes

## 2015-04-15 NOTE — Op Note (Signed)
  Name: Walter Wilkins  MRN: [id number]  Date: 04/10/2015   DOB: [birth year]  Stereotactic Radiosurgery Operative Note  PRE-OPERATIVE DIAGNOSIS:  Solitary Brain Metastasis  POST-OPERATIVE DIAGNOSIS:  Solitary Brain Metastasis  PROCEDURE:  Stereotactic Radiosurgery  SURGEON:  Peggyann Shoals, MD  NARRATIVE: The patient underwent a radiation treatment planning session in the radiation oncology simulation suite under the care of the radiation oncology physician and physicist.  I participated closely in the radiation treatment planning afterwards. The patient underwent planning CT which was fused to 3T high resolution MRI with 1 mm axial slices.  These images were fused on the planning system.  We contoured the gross target volumes and subsequently expanded this to yield the Planning Target Volume. I actively participated in the planning process.  I helped to define and review the target contours and also the contours of the optic pathway, eyes, brainstem and selected nearby organs at risk.  All the dose constraints for critical structures were reviewed and compared to AAPM Task Group 101.  The prescription dose conformity was reviewed.  I approved the plan electronically.    Accordingly, Walter Wilkins was brought to the TrueBeam stereotactic radiation treatment linac and placed in the custom immobilization mask.  The patient was aligned according to the IR fiducial markers with BrainLab Exactrac, then orthogonal x-rays were used in ExacTrac with the 6DOF robotic table and the shifts were made to align the patient  Walter Wilkins received stereotactic radiosurgery uneventfully.    The detailed description of the procedure is recorded in the radiation oncology procedure note.  I was present for the duration of the procedure.  DISPOSITION:  Following delivery, the patient was transported to nursing in stable condition and monitored for possible acute effects to be discharged to home in stable condition with follow-up  in one month.  Peggyann Shoals, MD 04/15/2015 4:09 PM

## 2015-04-15 NOTE — Progress Notes (Signed)
Patient ID: [name], male   DOB: 22-Jun-1956, 59 y.o.   MRN: [id number]  Name: Walter Wilkins  MRN: [id number]  Date: 04/10/2015   DOB: 01-Jun-1956  Stereotactic Radiosurgery Operative Note  PRE-OPERATIVE DIAGNOSIS:  Solitary Brain Metastasis  POST-OPERATIVE DIAGNOSIS:  Solitary Brain Metastasis  PROCEDURE:  Stereotactic Radiosurgery  SURGEON:  Peggyann Shoals, MD  NARRATIVE: The patient underwent a radiation treatment planning session in the radiation oncology simulation suite under the care of the radiation oncology physician and physicist.  I participated closely in the radiation treatment planning afterwards. The patient underwent planning CT which was fused to 3T high resolution MRI with 1 mm axial slices.  These images were fused on the planning system.  We contoured the gross target volumes and subsequently expanded this to yield the Planning Target Volume. I actively participated in the planning process.  I helped to define and review the target contours and also the contours of the optic pathway, eyes, brainstem and selected nearby organs at risk.  All the dose constraints for critical structures were reviewed and compared to AAPM Task Group 101.  The prescription dose conformity was reviewed.  I approved the plan electronically.    Accordingly, Walter Wilkins was brought to the TrueBeam stereotactic radiation treatment linac and placed in the custom immobilization mask.  The patient was aligned according to the IR fiducial markers with BrainLab Exactrac, then orthogonal x-rays were used in ExacTrac with the 6DOF robotic table and the shifts were made to align the patient  Walter Wilkins received stereotactic radiosurgery uneventfully.    The detailed description of the procedure is recorded in the radiation oncology procedure note.  I was present for the duration of the procedure.  DISPOSITION:  Following delivery, the patient was transported to nursing in stable condition and monitored for possible  acute effects to be discharged to home in stable condition with follow-up in one month.  Peggyann Shoals, MD 04/15/2015 4:05 PM

## 2015-04-17 ENCOUNTER — Ambulatory Visit (HOSPITAL_BASED_OUTPATIENT_CLINIC_OR_DEPARTMENT_OTHER): Payer: Commercial Managed Care - PPO | Admitting: Hematology

## 2015-04-17 ENCOUNTER — Other Ambulatory Visit: Payer: Self-pay | Admitting: *Deleted

## 2015-04-17 ENCOUNTER — Telehealth: Payer: Self-pay | Admitting: Hematology

## 2015-04-17 ENCOUNTER — Telehealth: Payer: Self-pay | Admitting: Medical Oncology

## 2015-04-17 ENCOUNTER — Other Ambulatory Visit (HOSPITAL_BASED_OUTPATIENT_CLINIC_OR_DEPARTMENT_OTHER): Payer: Commercial Managed Care - PPO

## 2015-04-17 ENCOUNTER — Encounter: Payer: Self-pay | Admitting: Hematology

## 2015-04-17 VITALS — BP 125/71 | HR 90 | Temp 98.3°F | Resp 18 | Ht 72.0 in | Wt 241.6 lb

## 2015-04-17 DIAGNOSIS — C3492 Malignant neoplasm of unspecified part of left bronchus or lung: Secondary | ICD-10-CM

## 2015-04-17 DIAGNOSIS — C7931 Secondary malignant neoplasm of brain: Secondary | ICD-10-CM | POA: Diagnosis not present

## 2015-04-17 DIAGNOSIS — C7951 Secondary malignant neoplasm of bone: Secondary | ICD-10-CM | POA: Diagnosis not present

## 2015-04-17 DIAGNOSIS — C787 Secondary malignant neoplasm of liver and intrahepatic bile duct: Secondary | ICD-10-CM

## 2015-04-17 LAB — CBC & DIFF AND RETIC
BASO%: 0 % (ref 0.0–2.0)
BASOS ABS: 0 10*3/uL (ref 0.0–0.1)
EOS%: 0.6 % (ref 0.0–7.0)
Eosinophils Absolute: 0.1 10*3/uL (ref 0.0–0.5)
HCT: 43.1 % (ref 38.4–49.9)
HEMOGLOBIN: 14.6 g/dL (ref 13.0–17.1)
Immature Retic Fract: 5.8 % (ref 3.00–10.60)
LYMPH#: 0.8 10*3/uL — AB (ref 0.9–3.3)
LYMPH%: 5.9 % — ABNORMAL LOW (ref 14.0–49.0)
MCH: 29.9 pg (ref 27.2–33.4)
MCHC: 33.9 g/dL (ref 32.0–36.0)
MCV: 88.3 fL (ref 79.3–98.0)
MONO#: 0.6 10*3/uL (ref 0.1–0.9)
MONO%: 4.9 % (ref 0.0–14.0)
NEUT#: 11.3 10*3/uL — ABNORMAL HIGH (ref 1.5–6.5)
NEUT%: 88.6 % — AB (ref 39.0–75.0)
Platelets: 110 10*3/uL — ABNORMAL LOW (ref 140–400)
RBC: 4.88 10*6/uL (ref 4.20–5.82)
RDW: 14.1 % (ref 11.0–14.6)
Retic %: 1.57 % (ref 0.80–1.80)
Retic Ct Abs: 76.62 10*3/uL (ref 34.80–93.90)
WBC: 12.7 10*3/uL — AB (ref 4.0–10.3)

## 2015-04-17 LAB — COMPREHENSIVE METABOLIC PANEL (CC13)
ALK PHOS: 248 U/L — AB (ref 40–150)
ALT: 25 U/L (ref 0–55)
AST: 10 U/L (ref 5–34)
Albumin: 2.8 g/dL — ABNORMAL LOW (ref 3.5–5.0)
Anion Gap: 16 mEq/L — ABNORMAL HIGH (ref 3–11)
BUN: 22.4 mg/dL (ref 7.0–26.0)
CALCIUM: 8.1 mg/dL — AB (ref 8.4–10.4)
CO2: 14 mEq/L — ABNORMAL LOW (ref 22–29)
CREATININE: 1.1 mg/dL (ref 0.7–1.3)
Chloride: 107 mEq/L (ref 98–109)
EGFR: 75 mL/min/{1.73_m2} — ABNORMAL LOW (ref >90)
GLUCOSE: 254 mg/dL — AB (ref 70–140)
Potassium: 4.5 mEq/L (ref 3.5–5.1)
Sodium: 138 mEq/L (ref 136–145)
TOTAL PROTEIN: 5.6 g/dL — AB (ref 6.4–8.3)
Total Bilirubin: 0.26 mg/dL (ref 0.20–1.20)

## 2015-04-17 MED ORDER — ONDANSETRON HCL 8 MG PO TABS: 8.0000 mg | ORAL_TABLET | Freq: Three times a day (TID) | ORAL | Status: DC | PRN

## 2015-04-17 NOTE — Telephone Encounter (Signed)
Gave avs & calendar for May. °

## 2015-04-17 NOTE — Telephone Encounter (Signed)
Call forwarded to raquel.

## 2015-04-17 NOTE — Progress Notes (Signed)
[place]  Telephone:([phone]) [phone] Fax:([phone]) [place] Note   Patient Care Team: [name]. [name], MD as PCP - General (Internal Medicine) 04/18/2015  CHIEF COMPLAINTS:  Follow up newly diagnosed metastatic lung cancer  Oncology History   Metastatic lung cancer (metastasis from lung to other site)   Staging form: Lung, AJCC 7th Edition     Clinical: Stage IV (T3, N3, M1b) - Unsigned        Metastatic lung cancer (metastasis from lung to other site)   03/24/2015 Imaging brian MRI 1.9 cm high left frontal lobe mass with minimal surrounding edema   03/24/2015 Imaging CT showed Multiple low-density lesions in the liver worrisome for metastatic disease. Consolidative infiltrate in the left lower lobe with interstitial infiltrate in the left upper lobe. Single enlarged left hilar lymph node.   03/27/2015 Initial Diagnosis Metastatic lung cancer (metastasis from lung to other site)   03/27/2015 Pathology Results Metastatic adenocarcinoma, IHC positive for CK 7, TTF-1 and Napsin A. Negative for CK 20, CD X2, PSA, consistent with lung primary   03/27/2015 Miscellaneous Foundation One test showed (+) EML4-ALK fusion (variant 2), CDKN2A/B loss, SMAD4 R361H.    04/07/2015 Imaging PET scan showed hypermetabolic soft tissue infiltrates in the left lower lobe lung, bilateral mediastinal adenopathy,widespread metastatic disease, including to thoracic, low cervical nodes, liver and bones.   04/10/2015 - 04/10/2015 Radiation Therapy SRS to the brain met      HISTORY OF PRESENTING ILLNESS:  Walter Wilkins 59 y.o. male is here because of newly diagnosed metastatic lung cancer.  He has been in good health until 3 month ago, when he started having intermittent nausea, and gradual weight loss a total of 18 lbs since then, although some are intentional by cutting back diet. He denies any vomiting, abdominal pain or discomfort, or change of his bowel habit.  He presented with syncope,  right after he noticed some titch of left face, and rolling over of his eyes. He will [name] on the ground and lost consciousness. This was witnessed by his wife and other people, 911 was called and paramedic staff underwent CPR for a total of 30 minutes. The cardiac monitor shows that he was in asystole. He was brought to [facility] and was admitted. He workup in the ambulance, and did not have any significant in neurology deficit afterwards. He was seen by cardiology in the neurology service, cardiac catheterization was negative for coronary artery disease. He underwent a CT of head which showed a 1.9 cm lesion in the left frontal lobe with surrounding edema. CT of the chest reviewed no PE, but significant infiltrative consolidation in the left lower lobe, it was felt to be aspiration pneumonia. CT of the abdomen reviewed multiple diffuse liver metastasis. He underwent a liver biopsy which showed adenocarcinoma.   He was a discharge to home. He has been doing well overall since hospital discharge. He has some residual pain from the rib fracture from CPR. He denies any cough, dyspnea, or other symptoms. He has mild fatigue and low appetite, which has been chronic since his gastric bypass surgery in 2011.   INTERIM HISTORY: Daymian returns for follow-up. He received brain stereotactic radiosurgery on 04/10/2015 and tolerated very well. He denies any significant pain, cough, or GI symptoms. He has moderate fatigue, able to do all self-care and light activities. He has mild to moderate dyspnea on exertion. He has fair appetite and no significant weight loss. His tumor Foundation one  genetic test showed ALK-EMLA 4 gene translocation.   MEDICAL HISTORY:  Past Medical History  Diagnosis Date  . Diabetes mellitus without complication   . H/O gastric bypass     a. ~2011 at [facility].  . Acid reflux   . Seizures 03/23/15  . Brain mass 03/24/15    ct head - left frontal lobe mass consistent  with a brain metastasis    SURGICAL HISTORY: Past Surgical History  Procedure Laterality Date  . Left heart catheterization with coronary angiogram N/A 03/23/2015    Procedure: LEFT HEART CATHETERIZATION WITH CORONARY ANGIOGRAM;  Surgeon: Burnell Blanks, MD;  Location: [facility] CATH LAB;  Service: Cardiovascular;  Laterality: N/A;  . Gastric bypass  2011  . Liver biopsy  03/27/15  . Appendectomy    . Cholecystectomy    . Right knee osroscopy Right     SOCIAL HISTORY: History   Social History  . Marital Status: Unknown    Spouse Name: N/A  . Number of Children: 2  . Years of Education: N/A   Occupational History  . Barrister's clerk    Social History Main Topics  . Smoking status: Uses cigar   . Smokeless tobacco: Not on file  . Alcohol Use: 0.0 oz/week    0 Standard drinks or equivalent per week     Comment: Occasionally socially - once a week or less  . Drug Use: No  . Sexual Activity: Not on file   Other Topics Concern  . Not on file   Social History Narrative    FAMILY HISTORY: Family History  Problem Relation Age of Onset  . Valvular heart disease Father     H/o pig valve  . Cancer Father     skin cancer   . Sudden death Neg Hx     No family history of sudden cardiac death  . Breast cancer Mother   . Cancer Mother 23    breast cancer   . Cancer Brother 42    base of tongue   . Cancer Maternal Uncle     throat cancer   . Cancer Maternal Uncle 90    colon cancer, and prostate cancer   . Cancer Cousin     lung cancer     ALLERGIES:  has No Known Allergies.  MEDICATIONS:  Current Outpatient Prescriptions  Medication Sig Dispense Refill  . ALPRAZolam (XANAX) 0.25 MG tablet Take 0.25 mg by mouth 2 (two) times daily as needed for anxiety. Last Refill 08/22/2014    . atorvastatin (LIPITOR) 40 MG tablet Take 40 mg by mouth daily. Last Refill 08/22/2014    . blood glucose meter kit and supplies KIT Dispense based on patient and insurance preference. Use up to  four times daily as directed. (FOR ICD-9 250.00, 250.01). 1 each 0  . dexamethasone (DECADRON) 4 MG tablet Take 1 tablet (4 mg total) by mouth 3 (three) times daily. Taper to 92m BID on April 25, and 460mdaily on May 9th. Discuss further taper with Dr SqIsidore Moost appointment in May. 12426ablet 0  . folic acid (FOLVITE) 1 MG tablet Take 1 tablet (1 mg total) by mouth daily. 30 tablet 1  . Insulin Glargine (LANTUS SOLOSTAR) 100 UNIT/ML Solostar Pen Inject 5 Units into the skin daily at 10 pm. (Patient taking differently: Inject 10 Units into the skin every morning. Then  15 units in the  PM.) 15 mL 0  . Insulin Pen Needle 32G X 4 MM MISC Use  with insulin pen as directed to dispense insulin 100 each 1  . levETIRAcetam (KEPPRA) 500 MG tablet Take 1 tablet (500 mg total) by mouth 2 (two) times daily. 60 tablet 2  . lisinopril (PRINIVIL,ZESTRIL) 5 MG tablet Take 5 mg by mouth daily. Last Refill 08/10/2014    . LORazepam (ATIVAN) 1 MG tablet Take 1 tablet 30 min before MRI or wearing radiotherapy mask 5 tablet 0  . metFORMIN (GLUCOPHAGE) 1000 MG tablet Take 1,000 mg by mouth 2 (two) times daily with a meal. Last refill 08/23/2014    . omeprazole (PRILOSEC) 20 MG capsule Take 20 mg by mouth daily. Last Refill 08/22/2014    . sertraline (ZOLOFT) 100 MG tablet Take 100 mg by mouth 2 (two) times daily. Last refill 08/22/2014    . traZODone (DESYREL) 50 MG tablet Take 50 mg by mouth at bedtime.    . crizotinib (XALKORI) 250 MG capsule Take 1 capsule (250 mg total) by mouth 2 (two) times daily. (Patient not taking: Reported on 04/17/2015) 60 capsule 5  . ondansetron (ZOFRAN) 8 MG tablet Take 1 tablet (8 mg total) by mouth every 8 (eight) hours as needed for nausea or vomiting. 20 tablet 0   No current facility-administered medications for this visit.    REVIEW OF SYSTEMS:   Constitutional: Denies fevers, chills or abnormal night sweats, (+) weight loss  Eyes: Denies blurriness of vision, double vision or watery  eyes Ears, nose, mouth, throat, and face: Denies mucositis or sore throat Respiratory: Denies cough, dyspnea or wheezes Cardiovascular: Denies palpitation, chest discomfort or lower extremity swelling Gastrointestinal:  Mild intermittent nausea, no heartburn or change in bowel habits Skin: Denies abnormal skin rashes Lymphatics: Denies new lymphadenopathy or easy bruising Neurological:Denies numbness, tingling or new weaknesses Behavioral/Psych: Mood is stable, no new changes  All other systems were reviewed with the patient and are negative.  PHYSICAL EXAMINATION: ECOG PERFORMANCE STATUS: 1 - Symptomatic but completely ambulatory  Filed Vitals:   04/17/15 1257  BP: 125/71  Pulse: 90  Temp: 98.3 F (36.8 C)  Resp: 18   Filed Weights   04/17/15 1257  Weight: 241 lb 9.6 oz (109.589 kg)    GENERAL:alert, no distress and comfortable SKIN: skin color, texture, turgor are normal, no rashes or significant lesions EYES: normal, conjunctiva are pink and non-injected, sclera clear OROPHARYNX:no exudate, no erythema and lips, buccal mucosa, and tongue normal  NECK: supple, thyroid normal size, non-tender, without nodularity LYMPH:  no palpable lymphadenopathy in the cervical, axillary or inguinal LUNGS: clear to auscultation and percussion with normal breathing effort HEART: regular rate & rhythm and no murmurs and no lower extremity edema ABDOMEN:abdomen soft, non-tender and normal bowel sounds Musculoskeletal:no cyanosis of digits and no clubbing  PSYCH: alert & oriented x 3 with fluent speech NEURO: no focal motor/sensory deficits  LABORATORY DATA:  I have reviewed the data as listed Lab Results  Component Value Date   WBC 12.7* 04/17/2015   HGB 14.6 04/17/2015   HCT 43.1 04/17/2015   MCV 88.3 04/17/2015   PLT 110* 04/17/2015    Recent Labs  03/24/15 0318 03/25/15 0237 03/27/15 0641 04/05/15 0846 04/17/15 1417  NA 137 134* 137  --  138  K 4.3 4.4 4.5  --  4.5  CL  103 100 100  --   --   CO2 29 27 28   --  14*  GLUCOSE 126* 192* 220*  --  254*  BUN 13 14 22  25.7 22.4  CREATININE 0.93  0.96 0.99 0.9 1.1  CALCIUM 8.5 8.9 8.8  --  8.1*  GFRNONAA 89* 88* 87*  --   --   GFRAA >90 >90 >90  --   --   PROT 6.0  --   --   --  5.6*  ALBUMIN 3.1*  --   --   --  2.8*  AST 28  --   --   --  10  ALT 22  --   --   --  25  ALKPHOS 135*  --   --   --  248*  BILITOT 0.5  --   --   --  0.26   PATHOLOGY REPORT: Liver, needle/core biopsy, right lobe 03/27/2015  - METASTATIC ADENOCARCINOMA, SEE COMMENT. Microscopic Comment The adenocarcinoma demonstrates the following immunophenotype Cytokeratin 7 - strong diffuse expression. Cytokeratin 20 - negative expression. TTF-1 - patchy moderate strong expression. Napsin A - strong diffuse expression. CDX-2 - negative expression. PSA - negative expression. Overall, the morphology and immunophenotype are that of metastatic adenocarcinoma, primary to lung. There is tumor available for ancillary tumor testing. The case is reviewed with Dr. Lyndon Code who concurs. The case was discussed with Dr Burr Medico on 03/29/2015. (CRR:gt:ecj, 03/29/15)  RADIOGRAPHIC STUDIES: I have personally reviewed the radiological images as listed and agreed with the findings in the report.  Ct Head Wo Contrast 03/24/2015    IMPRESSION: 1.9 cm high left frontal lobe mass with minimal surrounding edema, most consistent with a brain metastasis given findings on concurrent chest CT. Further evaluation with brain MRI (without and with contrast) is recommended.  These results were called by telephone at the time of interpretation on 03/24/2015 at 12:25 pm to PA [facility] , who verbally acknowledged these results.   Electronically Signed   By: Logan Bores   On: 03/24/2015 12:32   Ct Angio Chest Pe W/cm &/or Wo Cm 03/24/2015   IMPRESSION: 1. Multiple low-density lesions in the liver worrisome for metastatic disease. 2. No pulmonary emboli. 3. Consolidative infiltrate in the  left lower lobe with interstitial infiltrate in the left upper lobe. I suspect both of these represent aspiration pneumonitis. 4. Single enlarged left hilar lymph node. 5. Multiple bilateral anterior rib fractures most likely secondary to recent CPR.   Electronically Signed   By: Lorriane Shire M.D.   On: 03/24/2015 11:51   Mr Jeri Cos JJ Contrast 03/25/2015    IMPRESSION: Solitary 17 x 15 mm hemorrhagic LEFT frontal lobe mass corresponding to CT abnormality with imaging characteristics of metastasis.  Otherwise unremarkable MRI of the brain with contrast for age.   Electronically Signed   By: Elon Alas   On: 03/25/2015 03:41   Ct Abdomen Pelvis W Contrast 03/25/2015    IMPRESSION: 1. Multiple low-density liver lesions highly suspicious for metastatic disease. Largest lesion lies in the right lobe measuring 3.4 cm in greatest transverse dimension. 2. No other evidence of metastatic disease below the diaphragm. No acute findings within the abdomen or pelvis. 3. Lung base findings that are unchanged from the previous day's CT angiogram. Consolidation/atelectasis in left lower lobe. Probable adenopathy in the posterior inferior left mediastinum. Prior PET-CT evaluated a small left lower lobe nodule. This nodule was at the left lung base. This nodule is not visualized currently--it may be obscured by the lung consolidation. Consider repeat follow-up PET-CT to evaluate the source of presumed metastatic disease to the liver.   Electronically Signed   By: Lajean Manes M.D.   On:  03/25/2015 12:30   PET 04/07/2015 IMPRESSION: 1. Widespread metastatic disease, including to thoracic/low cervical nodes, liver, and bones. 2. Favor primary bronchogenic carcinoma in the left lower lobe. 3. Incidental findings, as detailed above. 4. Suspicion of nodal metastasis about the deep right parotid gland versus a metachronous primary neoplasm. Of doubtful clinical significance.  ASSESSMENT & PLAN:  59 year old  Caucasian male with minimal smoking history, obesity status post gastric bypass surgery, diabetes, presented with cardiac arrest and presumed seizure. Imaging study showed left lower lobe infiltrative consultation, left hilar adenopathy, diffuse liver metastasis and aortic lobe brain metastasis.  1. 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 Images were reviewed in person. -The liver biopsy immunostain were consistent with lung primary. Although the left lower lobe infiltrative consultation could be aspiration pneumonia, and more likely this presents a primary lung cancer. Patient did have a lung nodule in the left parietal lobe in the past, and he will bring his previous CT scan to me next visit. -I reviewed the PET scan findings with patient, unfortunately he has widespread metastatic disease. -s/p brain SRS. -I discussed the Foundation one genetic test results with patient and his wife. His tumor contains ALK-EML4 translocation, which can be targeted with ALK inhibitors. I recommend Crizotinib as his first-line treatment, which has showed superior response and him better tolerance than chemotherapy. The potential side effect of Crizotinib, which includes but not limited to, nausea, vomiting, diarrhea, skin rash, photosensitivity, vision disturbance, liver toxicity, etc, were discussed with patient, and he agrees to proceed -I have sent a prescription to specialty pharmacy last Friday, he will start as well as he receives the medication  2. Brain mets -s/p SRS, follow-up with radiation oncology -repeat scan in 3 months  3. Bone mets -Xgeva, every 4 weeks, starting next visit  4. history of cardiac arrest -Likely related to his breathing metastasis and seizure -Cardiac cath was negative for coronary artery disease  3. Seizure, secondary to brain metastasis -Continue  Keppra   4.  diabetes  -Continue follow-up with primary care physician.   Follow-up: I'll see him back in 3 weeks with lab and Xgeva    All questions were answered. The patient knows to call the clinic with any problems, questions or concerns. I spent 30 minutes counseling the patient face to face. The total time spent in the appointment was 40 minutes and more than 50% was on counseling.     [name], MD 04/18/2015 7:53 AM

## 2015-04-17 NOTE — Progress Notes (Signed)
I called and left Walter Wilkins at message at [employer] [phone] to call me back. She had left me a message

## 2015-04-18 ENCOUNTER — Encounter: Payer: Self-pay | Admitting: Hematology

## 2015-04-18 ENCOUNTER — Telehealth: Payer: Self-pay | Admitting: *Deleted

## 2015-04-18 NOTE — Telephone Encounter (Signed)
Spoke with wife Walter Wilkins, and instructed her to have pt get good checkup and for clearance from his dentist prior to [place].  Informed Walter Wilkins that Dr. Burr Medico plans to give pt Xgeva injection at his next office visit.  Walter Wilkins wanted to know about Crizotinib.  Informed Walter Wilkins that with [employer], he will have to get chemo med from [facility]. Walter Wilkins verbalized understanding and stated she was told that [facility] will be the pharmacy to dispense Xalkori as per pt's insurance. [facility]    Fax     [phone]      ;    Phone     [phone].

## 2015-04-19 ENCOUNTER — Encounter: Payer: Self-pay | Admitting: Hematology

## 2015-04-19 NOTE — Progress Notes (Signed)
Wife left message to send scrip to [phone]. I did and I called and left her a message at [phone] to advise I faxed this morning

## 2015-04-19 NOTE — Progress Notes (Signed)
See prev notes I will fax scrip to [facility].  [phone]

## 2015-04-20 ENCOUNTER — Encounter: Payer: Self-pay | Admitting: Hematology

## 2015-04-20 NOTE — Progress Notes (Signed)
I placed fmla forms for wife on desk of nurse for dr. Burr Medico

## 2015-04-21 ENCOUNTER — Other Ambulatory Visit: Payer: Self-pay | Admitting: *Deleted

## 2015-04-21 ENCOUNTER — Encounter: Payer: Self-pay | Admitting: Hematology

## 2015-04-21 NOTE — Progress Notes (Signed)
I faxed fmla forms for wife Walter Wilkins to [phone] and will have copy for her records.

## 2015-04-24 ENCOUNTER — Encounter: Payer: Self-pay | Admitting: Hematology

## 2015-04-24 NOTE — Progress Notes (Signed)
I mailed copy of fmla forms to wife.= Ivin Booty.

## 2015-04-24 NOTE — Progress Notes (Signed)
Call received from spouse Ivin Booty at 6222 am 04-24-2015 requesting Xalkori be faxed to correct fax number for [facility].  [phone] is the fax number provided.  Ivin Booty also asked that the correct address [phone] [place], [place]., [phone]) be written on the prescription and the correct phone number.  (Cell [phone]).  Will notify [employer].

## 2015-04-24 NOTE — Progress Notes (Signed)
I faxed scrip for xalkori to correct fax [phone] and correct ph# on the scrip  [phone]

## 2015-04-25 ENCOUNTER — Telehealth: Payer: Self-pay | Admitting: *Deleted

## 2015-04-25 NOTE — Telephone Encounter (Signed)
Spoke with wife Ivin Booty and was informed that Ivin Booty had received message from [facility] that Hulda Humphrey will be delivered on Thursday.  Lavera Guise to call office to let nurse know when pt receives meds,  and for pt to start taking Crizotinib as instructed by Dr. Burr Medico. Ivin Booty voiced understanding.  Franchot Gallo phone number to [place] to call for arrangement of medication delivery. [place]     Phone     [phone]. [facility]  Cell phone     [phone].

## 2015-04-25 NOTE — Telephone Encounter (Signed)
Spoke with Remo Lipps @ [facility] and was informed that [facility] is no longer dealing with pt's Crizotinib prescription due to pt's insurance required pt to use [facility].   Spoke with [facility] and was informed that pt has copay of  $ 10.00 , and Crizotinib will be shipped to pt either today or tomorrow.  Janett Billow stated she has tried to contact wife Ivin Booty to finalize shipment location.  Dr. Burr Medico notified. [facility]     Phone     [phone]. [place]     Phone      [phone]  In   [place].

## 2015-04-25 NOTE — Telephone Encounter (Signed)
Call received from spouse who reports the "prescription was received by [facility].  Expect receipt of shipment on Thursday from [facility].  If collaborative nurse needs to talk to me she can call me back."

## 2015-04-26 ENCOUNTER — Encounter: Payer: Self-pay | Admitting: Hematology

## 2015-04-26 ENCOUNTER — Telehealth: Payer: Self-pay | Admitting: *Deleted

## 2015-04-26 NOTE — Telephone Encounter (Signed)
Talked with wife, Ivin Booty & she reports that med will be shipped today & hopefully will receive tomorrow.  She reports that someone did go over side effects with Jeremiyah today via phone.

## 2015-04-26 NOTE — Progress Notes (Signed)
See prev notes. Xalkori shipment for the patient.

## 2015-05-08 ENCOUNTER — Telehealth: Payer: Self-pay | Admitting: Hematology

## 2015-05-08 ENCOUNTER — Ambulatory Visit (HOSPITAL_BASED_OUTPATIENT_CLINIC_OR_DEPARTMENT_OTHER): Payer: Commercial Managed Care - PPO

## 2015-05-08 ENCOUNTER — Other Ambulatory Visit (HOSPITAL_BASED_OUTPATIENT_CLINIC_OR_DEPARTMENT_OTHER): Payer: Commercial Managed Care - PPO

## 2015-05-08 ENCOUNTER — Ambulatory Visit (HOSPITAL_BASED_OUTPATIENT_CLINIC_OR_DEPARTMENT_OTHER): Payer: Commercial Managed Care - PPO | Admitting: Hematology

## 2015-05-08 ENCOUNTER — Encounter: Payer: Self-pay | Admitting: Hematology

## 2015-05-08 VITALS — BP 122/80 | HR 72 | Temp 98.0°F | Resp 18 | Ht 72.0 in | Wt 241.6 lb

## 2015-05-08 DIAGNOSIS — C7931 Secondary malignant neoplasm of brain: Secondary | ICD-10-CM | POA: Diagnosis not present

## 2015-05-08 DIAGNOSIS — C3492 Malignant neoplasm of unspecified part of left bronchus or lung: Secondary | ICD-10-CM

## 2015-05-08 DIAGNOSIS — C3490 Malignant neoplasm of unspecified part of unspecified bronchus or lung: Secondary | ICD-10-CM

## 2015-05-08 DIAGNOSIS — C7951 Secondary malignant neoplasm of bone: Secondary | ICD-10-CM

## 2015-05-08 DIAGNOSIS — C787 Secondary malignant neoplasm of liver and intrahepatic bile duct: Secondary | ICD-10-CM | POA: Diagnosis not present

## 2015-05-08 LAB — COMPREHENSIVE METABOLIC PANEL (CC13)
ALBUMIN: 3 g/dL — AB (ref 3.5–5.0)
ALT: 25 U/L (ref 0–55)
ANION GAP: 12 meq/L — AB (ref 3–11)
AST: 17 U/L (ref 5–34)
Alkaline Phosphatase: 173 U/L — ABNORMAL HIGH (ref 40–150)
BUN: 18 mg/dL (ref 7.0–26.0)
CALCIUM: 8.3 mg/dL — AB (ref 8.4–10.4)
CHLORIDE: 103 meq/L (ref 98–109)
CO2: 23 mEq/L (ref 22–29)
CREATININE: 0.8 mg/dL (ref 0.7–1.3)
Glucose: 143 mg/dl — ABNORMAL HIGH (ref 70–140)
POTASSIUM: 3.9 meq/L (ref 3.5–5.1)
Sodium: 139 mEq/L (ref 136–145)
TOTAL PROTEIN: 5.9 g/dL — AB (ref 6.4–8.3)
Total Bilirubin: 0.48 mg/dL (ref 0.20–1.20)

## 2015-05-08 LAB — CBC WITH DIFFERENTIAL/PLATELET
BASO%: 0.5 % (ref 0.0–2.0)
BASOS ABS: 0 10*3/uL (ref 0.0–0.1)
EOS%: 0 % (ref 0.0–7.0)
Eosinophils Absolute: 0 10*3/uL (ref 0.0–0.5)
HCT: 44.6 % (ref 38.4–49.9)
HGB: 14.9 g/dL (ref 13.0–17.1)
LYMPH%: 13.4 % — AB (ref 14.0–49.0)
MCH: 29.4 pg (ref 27.2–33.4)
MCHC: 33.4 g/dL (ref 32.0–36.0)
MCV: 87.9 fL (ref 79.3–98.0)
MONO#: 0.2 10*3/uL (ref 0.1–0.9)
MONO%: 3 % (ref 0.0–14.0)
NEUT#: 4.8 10*3/uL (ref 1.5–6.5)
NEUT%: 83.1 % — AB (ref 39.0–75.0)
Platelets: 117 10*3/uL — ABNORMAL LOW (ref 140–400)
RBC: 5.07 10*6/uL (ref 4.20–5.82)
RDW: 15.2 % — ABNORMAL HIGH (ref 11.0–14.6)
WBC: 5.8 10*3/uL (ref 4.0–10.3)
lymph#: 0.8 10*3/uL — ABNORMAL LOW (ref 0.9–3.3)

## 2015-05-08 MED ORDER — MIRTAZAPINE 15 MG PO TABS: 15.0000 mg | ORAL_TABLET | Freq: Every day | ORAL | Status: DC

## 2015-05-08 MED ORDER — DENOSUMAB 120 MG/1.7ML ~~LOC~~ SOLN
120.0000 mg | Freq: Once | SUBCUTANEOUS | Status: AC
  Administered 2015-05-08: 120 mg via SUBCUTANEOUS
  Filled 2015-05-08: qty 1.7

## 2015-05-08 NOTE — Progress Notes (Signed)
Instructed Walter Wilkins to start taking Calcium with Vitamin D twice daily since he is starting on Xgeva.  Consent form signed for X-geva.

## 2015-05-08 NOTE — Telephone Encounter (Signed)
Pt confirmed labs/ov/inj per 05/16 POF, gave pt AVS and Calendar.... KJ

## 2015-05-08 NOTE — Progress Notes (Signed)
[name]  [phone] Fax:([phone] [place] Note   Patient Care Team: [name]. [name], MD as PCP - General (Internal Medicine) 05/08/2015  CHIEF COMPLAINTS:  Follow up newly diagnosed metastatic lung cancer  Oncology History   Metastatic lung cancer (metastasis from lung to other site)   Staging form: Lung, AJCC 7th Edition     Clinical: Stage IV (T3, N3, M1b) - Unsigned        Metastatic lung cancer (metastasis from lung to other site)   03/24/2015 Imaging brian MRI 1.9 cm high left frontal lobe mass with minimal surrounding edema   03/24/2015 Imaging CT showed Multiple low-density lesions in the liver worrisome for metastatic disease. Consolidative infiltrate in the left lower lobe with interstitial infiltrate in the left upper lobe. Single enlarged left hilar lymph node.   03/27/2015 Initial Diagnosis Metastatic lung cancer (metastasis from lung to other site)   03/27/2015 Pathology Results Metastatic adenocarcinoma, IHC positive for CK 7, TTF-1 and Napsin A. Negative for CK 20, CD X2, PSA, consistent with lung primary   03/27/2015 Miscellaneous Foundation One test showed (+) EML4-ALK fusion (variant 2), CDKN2A/B loss, SMAD4 R361H.    04/07/2015 Imaging PET scan showed hypermetabolic soft tissue infiltrates in the left lower lobe lung, bilateral mediastinal adenopathy,widespread metastatic disease, including to thoracic, low cervical nodes, liver and bones.   04/10/2015 - 04/10/2015 Radiation Therapy SRS to the brain met      HISTORY OF PRESENTING ILLNESS:  Walter Wilkins 59 y.o. male is here because of newly diagnosed metastatic lung cancer.  He has been in good health until 3 month ago, when he started having intermittent nausea, and gradual weight loss a total of 18 lbs since then, although some are intentional by cutting back diet. He denies any vomiting, abdominal pain or discomfort, or change of his bowel habit.  He presented with syncope,  right after he noticed some titch of left face, and rolling over of his eyes. He will Walter Wilkins on the ground and lost consciousness. This was witnessed by his wife and other people, 911 was called and paramedic staff underwent CPR for a total of 30 minutes. The cardiac monitor shows that he was in asystole. He was brought to [facility] and was admitted. He workup in the ambulance, and did not have any significant in neurology deficit afterwards. He was seen by cardiology in the neurology service, cardiac catheterization was negative for coronary artery disease. He underwent a CT of head which showed a 1.9 cm lesion in the left frontal lobe with surrounding edema. CT of the chest reviewed no PE, but significant infiltrative consolidation in the left lower lobe, it was felt to be aspiration pneumonia. CT of the abdomen reviewed multiple diffuse liver metastasis. He underwent a liver biopsy which showed adenocarcinoma.   He was a discharge to home. He has been doing well overall since hospital discharge. He has some residual pain from the rib fracture from CPR. He denies any cough, dyspnea, or other symptoms. He has mild fatigue and low appetite, which has been chronic since his gastric bypass surgery in 2011.   INTERIM HISTORY: Walter Wilkins returns for follow-up. He started Crizotinib on 04/27/2015. He has some intermittent pressure feeling on tio of his head, no headaches, with mild inus pain. He also report dyspnea on exertion, no cough or chest pain, no fever, nausea or other     MEDICAL HISTORY:  Past Medical History  Diagnosis Date  .  Diabetes mellitus without complication   . H/O gastric bypass     a. ~2011 at [facility].  . Acid reflux   . Seizures 03/23/15  . Brain mass 03/24/15    ct head - left frontal lobe mass consistent with a brain metastasis    SURGICAL HISTORY: Past Surgical History  Procedure Laterality Date  . Left heart catheterization with coronary angiogram N/A  03/23/2015    Procedure: LEFT HEART CATHETERIZATION WITH CORONARY ANGIOGRAM;  Surgeon: Burnell Blanks, MD;  Location: [facility] CATH LAB;  Service: Cardiovascular;  Laterality: N/A;  . Gastric bypass  2011  . Liver biopsy  03/27/15  . Appendectomy    . Cholecystectomy    . Right knee osroscopy Right     SOCIAL HISTORY: History   Social History  . Marital Status: Unknown    Spouse Name: N/A  . Number of Children: 2  . Years of Education: N/A   Occupational History  . Barrister's clerk    Social History Main Topics  . Smoking status: Uses cigar   . Smokeless tobacco: Not on file  . Alcohol Use: 0.0 oz/week    0 Standard drinks or equivalent per week     Comment: Occasionally socially - once a week or less  . Drug Use: No  . Sexual Activity: Not on file   Other Topics Concern  . Not on file   Social History Narrative    FAMILY HISTORY: Family History  Problem Relation Age of Onset  . Valvular heart disease Father     H/o pig valve  . Cancer Father     skin cancer   . Sudden death Neg Hx     No family history of sudden cardiac death  . Breast cancer Mother   . Cancer Mother 52    breast cancer   . Cancer Brother 42    base of tongue   . Cancer Maternal Uncle     throat cancer   . Cancer Maternal Uncle 90    colon cancer, and prostate cancer   . Cancer Cousin     lung cancer     ALLERGIES:  has No Known Allergies.  MEDICATIONS:  Current Outpatient Prescriptions  Medication Sig Dispense Refill  . ALPRAZolam (XANAX) 0.25 MG tablet Take 0.25 mg by mouth 2 (two) times daily as needed for anxiety. Last Refill 08/22/2014    . atorvastatin (LIPITOR) 40 MG tablet Take 40 mg by mouth daily. Last Refill 08/22/2014    . blood glucose meter kit and supplies KIT Dispense based on patient and insurance preference. Use up to four times daily as directed. (FOR ICD-9 250.00, 250.01). 1 each 0  . crizotinib (XALKORI) 250 MG capsule Take 1 capsule (250 mg total) by mouth 2 (two)  times daily. 60 capsule 5  . dexamethasone (DECADRON) 4 MG tablet Take 1 tablet (4 mg total) by mouth 3 (three) times daily. Taper to 78m BID on April 25, and 477mdaily on May 9th. Discuss further taper with Dr SqIsidore Moost appointment in May. (Patient taking differently: Take 4 mg by mouth 3 (three) times daily. Taper to 56m35mID on April 25, and 56mg63mily on May 9th. Discuss further taper with Dr SquiIsidore Moosappointment in May. On 4 mg daily now 05/07/16) 120 443let 0  . folic acid (FOLVITE) 1 MG tablet Take 1 tablet (1 mg total) by mouth daily. 30 tablet 1  . Insulin Glargine (LANTUS SOLOSTAR) 100 UNIT/ML Solostar Pen  Inject 5 Units into the skin daily at 10 pm. (Patient taking differently: Inject 10 Units into the skin every morning. Then  15 units in the  PM.) 15 mL 0  . Insulin Pen Needle 32G X 4 MM MISC Use with insulin pen as directed to dispense insulin 100 each 1  . levETIRAcetam (KEPPRA) 500 MG tablet Take 1 tablet (500 mg total) by mouth 2 (two) times daily. 60 tablet 2  . lisinopril (PRINIVIL,ZESTRIL) 5 MG tablet Take 5 mg by mouth daily. Last Refill 08/10/2014    . LORazepam (ATIVAN) 1 MG tablet Take 1 tablet 30 min before MRI or wearing radiotherapy mask 5 tablet 0  . metFORMIN (GLUCOPHAGE) 1000 MG tablet Take 1,000 mg by mouth 2 (two) times daily with a meal. Last refill 08/23/2014    . omeprazole (PRILOSEC) 20 MG capsule Take 20 mg by mouth daily. Last Refill 08/22/2014    . ondansetron (ZOFRAN) 8 MG tablet Take 1 tablet (8 mg total) by mouth every 8 (eight) hours as needed for nausea or vomiting. 20 tablet 0  . sertraline (ZOLOFT) 100 MG tablet Take 100 mg by mouth 2 (two) times daily. Last refill 08/22/2014    . traZODone (DESYREL) 50 MG tablet Take 50 mg by mouth at bedtime.     No current facility-administered medications for this visit.   Facility-Administered Medications Ordered in Other Visits  Medication Dose Route Frequency Provider Last Rate Last Dose  . denosumab (XGEVA) injection  120 mg  120 mg Subcutaneous Once [name], MD        REVIEW OF SYSTEMS:   Constitutional: Denies fevers, chills or abnormal night sweats, (+) weight loss  Eyes: Denies blurriness of vision, double vision or watery eyes Ears, nose, mouth, throat, and face: Denies mucositis or sore throat Respiratory: Denies cough, dyspnea or wheezes Cardiovascular: Denies palpitation, chest discomfort or lower extremity swelling Gastrointestinal:  Mild intermittent nausea, no heartburn or change in bowel habits Skin: Denies abnormal skin rashes Lymphatics: Denies new lymphadenopathy or easy bruising Neurological:Denies numbness, tingling or new weaknesses Behavioral/Psych: Mood is stable, no new changes  All other systems were reviewed with the patient and are negative.  PHYSICAL EXAMINATION: ECOG PERFORMANCE STATUS: 1 - Symptomatic but completely ambulatory  Filed Vitals:   05/08/15 1314  BP: 122/80  Pulse: 72  Temp: 98 F (36.7 C)  Resp: 18   Filed Weights   05/08/15 1314  Weight: 241 lb 9.6 oz (109.589 kg)    GENERAL:alert, no distress and comfortable SKIN: skin color, texture, turgor are normal, no rashes or significant lesions EYES: normal, conjunctiva are pink and non-injected, sclera clear OROPHARYNX:no exudate, no erythema and lips, buccal mucosa, and tongue normal  NECK: supple, thyroid normal size, non-tender, without nodularity LYMPH:  no palpable lymphadenopathy in the cervical, axillary or inguinal LUNGS: clear to auscultation and percussion with normal breathing effort HEART: regular rate & rhythm and no murmurs and no lower extremity edema ABDOMEN:abdomen soft, non-tender and normal bowel sounds Musculoskeletal:no cyanosis of digits and no clubbing  PSYCH: alert & oriented x 3 with fluent speech NEURO: no focal motor/sensory deficits  LABORATORY DATA:  I have reviewed the data as listed CBC Latest Ref Rng 05/08/2015 04/17/2015 03/27/2015  WBC 4.0 - 10.3 10e3/uL 5.8 12.7(H)  13.1(H)  Hemoglobin 13.0 - 17.1 g/dL 14.9 14.6 14.2  Hematocrit 38.4 - 49.9 % 44.6 43.1 42.3  Platelets 140 - 400 10e3/uL 117(L) 110(L) 229    CMP Latest Ref Rng 05/08/2015 04/17/2015  04/05/2015  Glucose 70 - 140 mg/dl 143(H) 254(H) -  BUN 7.0 - 26.0 mg/dL 18.0 22.4 25.7  Creatinine 0.7 - 1.3 mg/dL 0.8 1.1 0.9  Sodium 136 - 145 mEq/L 139 138 -  Potassium 3.5 - 5.1 mEq/L 3.9 4.5 -  Chloride 96 - 112 mmol/L - - -  CO2 22 - 29 mEq/L 23 14(L) -  Calcium 8.4 - 10.4 mg/dL 8.3(L) 8.1(L) -  Total Protein 6.4 - 8.3 g/dL 5.9(L) 5.6(L) -  Total Bilirubin 0.20 - 1.20 mg/dL 0.48 0.26 -  Alkaline Phos 40 - 150 U/L 173(H) 248(H) -  AST 5 - 34 U/L 17 10 -  ALT 0 - 55 U/L 25 25 -      PATHOLOGY REPORT: Liver, needle/core biopsy, right lobe 03/27/2015  - METASTATIC ADENOCARCINOMA, SEE COMMENT. Microscopic Comment The adenocarcinoma demonstrates the following immunophenotype Cytokeratin 7 - strong diffuse expression. Cytokeratin 20 - negative expression. TTF-1 - patchy moderate strong expression. Napsin A - strong diffuse expression. CDX-2 - negative expression. PSA - negative expression. Overall, the morphology and immunophenotype are that of metastatic adenocarcinoma, primary to lung. There is tumor available for ancillary tumor testing. The case is reviewed with Dr. Lyndon Code who concurs. The case was discussed with Dr Burr Medico on 03/29/2015. (CRR:gt:ecj, 03/29/15)  RADIOGRAPHIC STUDIES: I have personally reviewed the radiological images as listed and agreed with the findings in the report.  Ct Head Wo Contrast 03/24/2015    IMPRESSION: 1.9 cm high left frontal lobe mass with minimal surrounding edema, most consistent with a brain metastasis given findings on concurrent chest CT. Further evaluation with brain MRI (without and with contrast) is recommended.  These results were called by telephone at the time of interpretation on 03/24/2015 at 12:25 pm to PA [facility] , who verbally acknowledged these results.    Electronically Signed   By: Logan Bores   On: 03/24/2015 12:32   Ct Angio Chest Pe W/cm &/or Wo Cm 03/24/2015   IMPRESSION: 1. Multiple low-density lesions in the liver worrisome for metastatic disease. 2. No pulmonary emboli. 3. Consolidative infiltrate in the left lower lobe with interstitial infiltrate in the left upper lobe. I suspect both of these represent aspiration pneumonitis. 4. Single enlarged left hilar lymph node. 5. Multiple bilateral anterior rib fractures most likely secondary to recent CPR.   Electronically Signed   By: Lorriane Shire M.D.   On: 03/24/2015 11:51   Mr Jeri Cos ZO Contrast 03/25/2015    IMPRESSION: Solitary 17 x 15 mm hemorrhagic LEFT frontal lobe mass corresponding to CT abnormality with imaging characteristics of metastasis.  Otherwise unremarkable MRI of the brain with contrast for age.   Electronically Signed   By: Elon Alas   On: 03/25/2015 03:41   Ct Abdomen Pelvis W Contrast 03/25/2015    IMPRESSION: 1. Multiple low-density liver lesions highly suspicious for metastatic disease. Largest lesion lies in the right lobe measuring 3.4 cm in greatest transverse dimension. 2. No other evidence of metastatic disease below the diaphragm. No acute findings within the abdomen or pelvis. 3. Lung base findings that are unchanged from the previous day's CT angiogram. Consolidation/atelectasis in left lower lobe. Probable adenopathy in the posterior inferior left mediastinum. Prior PET-CT evaluated a small left lower lobe nodule. This nodule was at the left lung base. This nodule is not visualized currently--it may be obscured by the lung consolidation. Consider repeat follow-up PET-CT to evaluate the source of presumed metastatic disease to the liver.   Electronically  Signed   By: Lajean Manes M.D.   On: 03/25/2015 12:30   PET 04/07/2015 IMPRESSION: 1. Widespread metastatic disease, including to thoracic/low cervical nodes, liver, and bones. 2. Favor primary bronchogenic  carcinoma in the left lower lobe. 3. Incidental findings, as detailed above. 4. Suspicion of nodal metastasis about the deep right parotid gland versus a metachronous primary neoplasm. Of doubtful clinical significance.  ASSESSMENT & PLAN:  59 year old Caucasian male with minimal smoking history, obesity status post gastric bypass surgery, diabetes, presented with cardiac arrest and presumed seizure. Imaging study showed left lower lobe infiltrative consultation, left hilar adenopathy, diffuse liver metastasis and aortic lobe brain metastasis.  1. 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 Images were reviewed in person. -The liver biopsy immunostain were consistent with lung primary. Although the left lower lobe infiltrative consultation could be aspiration pneumonia, and more likely this presents a primary lung cancer. Patient did have a lung nodule in the left parietal lobe in the past, and he will bring his previous CT scan to me next visit. -I reviewed the PET scan findings with patient, unfortunately he has widespread metastatic disease. -s/p brain SRS. -I discussed the Foundation one genetic test results with patient and his wife. His tumor contains ALK-EML4 translocation, which can be targeted with ALK inhibitors. I recommend Crizotinib as his first-line treatment, which has showed superior response and him better tolerance than chemotherapy. The potential side effect of Crizotinib, which includes but not limited to, nausea, vomiting, diarrhea, skin rash, photosensitivity, vision disturbance, liver toxicity, etc, were discussed with patient, and he agrees to proceed -I have sent a prescription to specialty pharmacy last Friday, he will start as well as he receives the medication  2. Brain mets -s/p SRS, follow-up with radiation oncology -repeat scan in 3 months  3.  Bone mets -Xgeva, every 4 weeks, starting next visit  4. history of cardiac arrest -Likely related to his breathing metastasis and seizure -Cardiac cath was negative for coronary artery disease  3. Seizure, secondary to brain metastasis -Continue Keppra   4.  diabetes  -Continue follow-up with primary care physician.   Follow-up: I'll see him back in 3 weeks with lab and Xgeva    All questions were answered. The patient knows to call the clinic with any problems, questions or concerns. I spent 30 minutes counseling the patient face to face. The total time spent in the appointment was 40 minutes and more than 50% was on counseling.     [name], MD 05/08/2015 1:53 PM

## 2015-05-08 NOTE — Progress Notes (Signed)
Walter Wilkins  [phone] Fax:([phone] [place] Note   Patient Care Team: [name]. [name], MD as PCP - General (Internal Medicine) 05/08/2015  CHIEF COMPLAINTS:  Follow up newly diagnosed metastatic lung cancer  Oncology History   Metastatic lung cancer (metastasis from lung to other site)   Staging form: Lung, AJCC 7th Edition     Clinical: Stage IV (T3, N3, M1b) - Unsigned        Metastatic lung cancer (metastasis from lung to other site)   03/24/2015 Imaging brian MRI 1.9 cm high left frontal lobe mass with minimal surrounding edema   03/24/2015 Imaging CT showed Multiple low-density lesions in the liver worrisome for metastatic disease. Consolidative infiltrate in the left lower lobe with interstitial infiltrate in the left upper lobe. Single enlarged left hilar lymph node.   03/27/2015 Initial Diagnosis Metastatic lung cancer (metastasis from lung to other site)   03/27/2015 Pathology Results Metastatic adenocarcinoma, IHC positive for CK 7, TTF-1 and Napsin A. Negative for CK 20, CD X2, PSA, consistent with lung primary   03/27/2015 Miscellaneous Foundation One test showed (+) EML4-ALK fusion (variant 2), CDKN2A/B loss, SMAD4 R361H.    04/07/2015 Imaging PET scan showed hypermetabolic soft tissue infiltrates in the left lower lobe lung, bilateral mediastinal adenopathy,widespread metastatic disease, including to thoracic, low cervical nodes, liver and bones.   04/10/2015 - 04/10/2015 Radiation Therapy SRS to the brain met    04/27/2015 -  Chemotherapy Crizotinib 241m bid      HISTORY OF PRESENTING ILLNESS:  Walter Severs59y.o. male is here because of newly diagnosed metastatic lung cancer.  He has been in good health until 3 month ago, when he started having intermittent nausea, and gradual weight loss a total of 18 lbs since then, although some are intentional by cutting back diet. He denies any vomiting, abdominal pain or discomfort, or change  of his bowel habit.  He presented with syncope, right after he noticed some titch of left face, and rolling over of his eyes. He will FJosefa Halfon the ground and lost consciousness. This was witnessed by his wife and other people, 911 was called and paramedic staff underwent CPR for a total of 30 minutes. The cardiac monitor shows that he was in asystole. [facility] He workup in the ambulance, and did not have any significant in neurology deficit afterwards. He was seen by cardiology in the neurology service, cardiac catheterization was negative for coronary artery disease. He underwent a CT of head which showed a 1.9 cm lesion in the left frontal lobe with surrounding edema. CT of the chest reviewed no PE, but significant infiltrative consolidation in the left lower lobe, it was felt to be aspiration pneumonia. CT of the abdomen reviewed multiple diffuse liver metastasis. He underwent a liver biopsy which showed adenocarcinoma.   He was a discharge to home. He has been doing well overall since hospital discharge. He has some residual pain from the rib fracture from CPR. He denies any cough, dyspnea, or other symptoms. He has mild fatigue and low appetite, which has been chronic since his gastric bypass surgery in 2011.   INTERIM HISTORY: JWenreturns for follow-up. He started a presented on 04/27/2015. He has been tolerated well overall. He had one episode of nausea, resolved after taking Zofran. He notices some blurry vision, floating on the peripheral vision daily, he has changed his reading glasses, and he is able  to read and watch TV better. He also noticed some pressure feeling on the top of head, no headaches. No other new neural symptoms. He is also quite depressed since her cancer diagnosis, sleeps a lot during the day, can sleep well at night, and does not go out very often. He is not physically active. He does have mild dyspnea on exertion, no significant  cough, chest pain. His appetite is good. No other new symptoms.   MEDICAL HISTORY:  Past Medical History  Diagnosis Date  . Diabetes mellitus without complication   . H/O gastric bypass     a. ~2011 at [facility].  . Acid reflux   . Seizures 03/23/15  . Brain mass 03/24/15    ct head - left frontal lobe mass consistent with a brain metastasis    SURGICAL HISTORY: Past Surgical History  Procedure Laterality Date  . Left heart catheterization with coronary angiogram N/A 03/23/2015    Procedure: LEFT HEART CATHETERIZATION WITH CORONARY ANGIOGRAM;  Surgeon: Burnell Blanks, MD;  Location: [facility] CATH LAB;  Service: Cardiovascular;  Laterality: N/A;  . Gastric bypass  2011  . Liver biopsy  03/27/15  . Appendectomy    . Cholecystectomy    . Right knee osroscopy Right     SOCIAL HISTORY: History   Social History  . Marital Status: Unknown    Spouse Name: N/A  . Number of Children: 2  . Years of Education: N/A   Occupational History  . Barrister's clerk    Social History Main Topics  . Smoking status: Uses cigar   . Smokeless tobacco: Not on file  . Alcohol Use: 0.0 oz/week    0 Standard drinks or equivalent per week     Comment: Occasionally socially - once a week or less  . Drug Use: No  . Sexual Activity: Not on file   Other Topics Concern  . Not on file   Social History Narrative    FAMILY HISTORY: Family History  Problem Relation Age of Onset  . Valvular heart disease Father     H/o pig valve  . Cancer Father     skin cancer   . Sudden death Neg Hx     No family history of sudden cardiac death  . Breast cancer Mother   . Cancer Mother 90    breast cancer   . Cancer Brother 42    base of tongue   . Cancer Maternal Uncle     throat cancer   . Cancer Maternal Uncle 90    colon cancer, and prostate cancer   . Cancer Cousin     lung cancer     ALLERGIES:  has No Known Allergies.  MEDICATIONS:  Current Outpatient Prescriptions  Medication Sig  Dispense Refill  . ALPRAZolam (XANAX) 0.25 MG tablet Take 0.25 mg by mouth 2 (two) times daily as needed for anxiety. Last Refill 08/22/2014    . atorvastatin (LIPITOR) 40 MG tablet Take 40 mg by mouth daily. Last Refill 08/22/2014    . blood glucose meter kit and supplies KIT Dispense based on patient and insurance preference. Use up to four times daily as directed. (FOR ICD-9 250.00, 250.01). 1 each 0  . crizotinib (XALKORI) 250 MG capsule Take 1 capsule (250 mg total) by mouth 2 (two) times daily. 60 capsule 5  . dexamethasone (DECADRON) 4 MG tablet Take 1 tablet (4 mg total) by mouth 3 (three) times daily. Taper to 67m BID on April 25, and  57m daily on May 9th. Discuss further taper with Dr SIsidore Moosat appointment in May. (Patient taking differently: Take 4 mg by mouth 3 (three) times daily. Taper to 481mBID on April 25, and 67m567maily on May 9th. Discuss further taper with Dr SquIsidore Moos appointment in May. On 4 mg daily now 05/07/16) [id number] 0  . folic acid (FOLVITE) 1 MG tablet Take 1 tablet (1 mg total) by mouth daily. 30 tablet 1  . Insulin Glargine (LANTUS SOLOSTAR) 100 UNIT/ML Solostar Pen Inject 5 Units into the skin daily at 10 pm. (Patient taking differently: Inject 10 Units into the skin every morning. Then  15 units in the  PM.) 15 mL 0  . Insulin Pen Needle 32G X 4 MM MISC Use with insulin pen as directed to dispense insulin 100 each 1  . levETIRAcetam (KEPPRA) 500 MG tablet Take 1 tablet (500 mg total) by mouth 2 (two) times daily. 60 tablet 2  . lisinopril (PRINIVIL,ZESTRIL) 5 MG tablet Take 5 mg by mouth daily. Last Refill 08/10/2014    . LORazepam (ATIVAN) 1 MG tablet Take 1 tablet 30 min before MRI or wearing radiotherapy mask 5 tablet 0  . metFORMIN (GLUCOPHAGE) 1000 MG tablet Take 1,000 mg by mouth 2 (two) times daily with a meal. Last refill 08/23/2014    . omeprazole (PRILOSEC) 20 MG capsule Take 20 mg by mouth daily. Last Refill 08/22/2014    . ondansetron (ZOFRAN) 8 MG tablet Take 1  tablet (8 mg total) by mouth every 8 (eight) hours as needed for nausea or vomiting. 20 tablet 0  . sertraline (ZOLOFT) 100 MG tablet Take 100 mg by mouth 2 (two) times daily. Last refill 08/22/2014    . traZODone (DESYREL) 50 MG tablet Take 50 mg by mouth at bedtime.    . mirtazapine (REMERON) 15 MG tablet Take 1 tablet (15 mg total) by mouth at bedtime. 30 tablet 0   No current facility-administered medications for this visit.    REVIEW OF SYSTEMS:   Constitutional: Denies fevers, chills or abnormal night sweats, (+) weight loss  Eyes: Denies blurriness of vision, double vision or watery eyes Ears, nose, mouth, throat, and face: Denies mucositis or sore throat Respiratory: Denies cough, dyspnea or wheezes Cardiovascular: Denies palpitation, chest discomfort or lower extremity swelling Gastrointestinal:  Mild intermittent nausea, no heartburn or change in bowel habits Skin: Denies abnormal skin rashes Lymphatics: Denies new lymphadenopathy or easy bruising Neurological:Denies numbness, tingling or new weaknesses Behavioral/Psych: Mood is stable, no new changes  All other systems were reviewed with the patient and are negative.  PHYSICAL EXAMINATION: ECOG PERFORMANCE STATUS: 1 - Symptomatic but completely ambulatory  Filed Vitals:   05/08/15 1314  BP: 122/80  Pulse: 72  Temp: 98 F (36.7 C)  Resp: 18   Filed Weights   05/08/15 1314  Weight: 241 lb 9.6 oz (109.589 kg)    GENERAL:alert, no distress and comfortable SKIN: skin color, texture, turgor are normal, no rashes or significant lesions EYES: normal, conjunctiva are pink and non-injected, sclera clear OROPHARYNX:no exudate, no erythema and lips, buccal mucosa, and tongue normal  NECK: supple, thyroid normal size, non-tender, without nodularity LYMPH:  no palpable lymphadenopathy in the cervical, axillary or inguinal LUNGS: clear to auscultation and percussion with normal breathing effort HEART: regular rate & rhythm and  no murmurs and no lower extremity edema ABDOMEN:abdomen soft, non-tender and normal bowel sounds Musculoskeletal:no cyanosis of digits and no clubbing  PSYCH: alert & oriented x  3 with fluent speech NEURO: no focal motor/sensory deficits  LABORATORY DATA:  I have reviewed the data as CBC Latest Ref Rng 05/08/2015 04/17/2015 03/27/2015  WBC 4.0 - 10.3 10e3/uL 5.8 12.7(H) 13.1(H)  Hemoglobin 13.0 - 17.1 g/dL 14.9 14.6 14.2  Hematocrit 38.4 - 49.9 % 44.6 43.1 42.3  Platelets 140 - 400 10e3/uL 117(L) 110(L) 229     Recent Labs  03/24/15 0318 03/25/15 0237 03/27/15 0641 04/05/15 0846 04/17/15 1417 05/08/15 1218  NA 137 134* 137  --  138 139  K 4.3 4.4 4.5  --  4.5 3.9  CL 103 100 100  --   --   --   CO2 29 27 28   --  14* 23  GLUCOSE 126* 192* 220*  --  254* 143*  BUN 13 14 22  25.7 22.4 18.0  CREATININE 0.93 0.96 0.99 0.9 1.1 0.8  CALCIUM 8.5 8.9 8.8  --  8.1* 8.3*  GFRNONAA 89* 88* 87*  --   --   --   GFRAA >90 >90 >90  --   --   --   PROT 6.0  --   --   --  5.6* 5.9*  ALBUMIN 3.1*  --   --   --  2.8* 3.0*  AST 28  --   --   --  10 17  ALT 22  --   --   --  25 25  ALKPHOS 135*  --   --   --  248* 173*  BILITOT 0.5  --   --   --  0.26 0.48   PATHOLOGY REPORT: Liver, needle/core biopsy, right lobe 03/27/2015  - METASTATIC ADENOCARCINOMA, SEE COMMENT. Microscopic Comment The adenocarcinoma demonstrates the following immunophenotype Cytokeratin 7 - strong diffuse expression. Cytokeratin 20 - negative expression. TTF-1 - patchy moderate strong expression. Napsin A - strong diffuse expression. CDX-2 - negative expression. PSA - negative expression. Overall, the morphology and immunophenotype are that of metastatic adenocarcinoma, primary to lung. There is tumor available for ancillary tumor testing. The case is reviewed with Dr. Lyndon Code who concurs. The case was discussed with Dr Burr Medico on 03/29/2015. (CRR:gt:ecj, 03/29/15)  RADIOGRAPHIC STUDIES: I have personally reviewed the  radiological images as listed and agreed with the findings in the report.  Ct Head Wo Contrast 03/24/2015    IMPRESSION: 1.9 cm high left frontal lobe mass with minimal surrounding edema, most consistent with a brain metastasis given findings on concurrent chest CT. Further evaluation with brain MRI (without and with contrast) is recommended.  These results were called by telephone at the time of interpretation on 03/24/2015 at 12:25 pm to PA Walter Wilkins , who verbally acknowledged these results.   Electronically Signed   By: Logan Bores   On: 03/24/2015 12:32   Ct Angio Chest Pe W/cm &/or Wo Cm 03/24/2015   IMPRESSION: 1. Multiple low-density lesions in the liver worrisome for metastatic disease. 2. No pulmonary emboli. 3. Consolidative infiltrate in the left lower lobe with interstitial infiltrate in the left upper lobe. I suspect both of these represent aspiration pneumonitis. 4. Single enlarged left hilar lymph node. 5. Multiple bilateral anterior rib fractures most likely secondary to recent CPR.   Electronically Signed   By: Lorriane Shire M.D.   On: 03/24/2015 11:51   Mr Jeri Cos VC Contrast 03/25/2015    IMPRESSION: Solitary 17 x 15 mm hemorrhagic LEFT frontal lobe mass corresponding to CT abnormality with imaging characteristics of metastasis.  Otherwise unremarkable MRI of  the brain with contrast for age.   Electronically Signed   By: Elon Alas   On: 03/25/2015 03:41   Ct Abdomen Pelvis W Contrast 03/25/2015    IMPRESSION: 1. Multiple low-density liver lesions highly suspicious for metastatic disease. Largest lesion lies in the right lobe measuring 3.4 cm in greatest transverse dimension. 2. No other evidence of metastatic disease below the diaphragm. No acute findings within the abdomen or pelvis. 3. Lung base findings that are unchanged from the previous day's CT angiogram. Consolidation/atelectasis in left lower lobe. Probable adenopathy in the posterior inferior left mediastinum. Prior PET-CT  evaluated a small left lower lobe nodule. This nodule was at the left lung base. This nodule is not visualized currently--it may be obscured by the lung consolidation. Consider repeat follow-up PET-CT to evaluate the source of presumed metastatic disease to the liver.   Electronically Signed   By: Lajean Manes M.D.   On: 03/25/2015 12:30   PET 04/07/2015 IMPRESSION: 1. Widespread metastatic disease, including to thoracic/low cervical nodes, liver, and bones. 2. Favor primary bronchogenic carcinoma in the left lower lobe. 3. Incidental findings, as detailed above. 4. Suspicion of nodal metastasis about the deep right parotid gland versus a metachronous primary neoplasm. Of doubtful clinical significance.  ASSESSMENT & PLAN:  59 year old Caucasian male with minimal smoking history, obesity status post gastric bypass surgery, diabetes, presented with cardiac arrest and presumed seizure. Imaging study showed left lower lobe infiltrative consultation, left hilar adenopathy, diffuse liver metastasis and left front lobe brain metastasis.  1. 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 Images were reviewed in person. -The liver biopsy immunostain were consistent with lung primary. Although the left lower lobe infiltrative consultation could be aspiration pneumonia, and more likely this presents a primary lung cancer. -I reviewed the PET scan findings with patient, unfortunately he has widespread metastatic disease. -s/p brain SRS. -I discussed the Foundation one genetic test results with patient and his wife. His tumor contains ALK-EML4 translocation, which can be targeted with ALK inhibitors. I recommend Crizotinib as his first-line treatment, which has showed superior response and him better tolerance than chemotherapy. The potential side effect of Crizotinib, which includes but  not limited to, nausea, vomiting, diarrhea, skin rash, photosensitivity, vision disturbance, liver toxicity, etc, were discussed with patient, and he agrees to proceed -He has been tolerating crizotinib well, we'll continue -Restaging in 2 months  2. Brain mets -s/p SRS, follow-up with radiation oncology -repeat scan in 3 months  3. Bone mets -Xgeva, every 4 weeks, starting today   4. history of cardiac arrest -Likely related to his brain metastasis and seizure -Cardiac cath was negative for coronary artery disease  3. Seizure, secondary to brain metastasis -Continue Keppra   4.  diabetes  -Continue follow-up with primary care physician.   5. Depression -His depression and anxiety got worse since her cancer diagnosis, he also has insomnia -I recommend him to switch Zoloft to mirtazapine, which may help with his sleep and appetite. He agrees.  -I sent a prescription of mirtazapine 50 mg at bedtime to his pharmacy. We'll increase the dose as tolerated. -I encouraged him to be mentally positive, and physically more active. He agrees to try.  Follow-up: I'll see him back in 4 weeks with lab and Xgeva    All questions were answered. The patient knows to call the clinic with any problems, questions or  concerns. I spent 20 minutes counseling the patient face to face. The total time spent in the appointment was 25 minutes and more than 50% was on counseling.     [name], MD 05/08/2015 2:37 PM

## 2015-05-18 ENCOUNTER — Encounter: Payer: Self-pay | Admitting: Hematology

## 2015-05-18 ENCOUNTER — Telehealth: Payer: Self-pay | Admitting: *Deleted

## 2015-05-18 NOTE — Telephone Encounter (Signed)
Called pt at home and left message on voice mail re: a letter dictated by Dr. Burr Medico is ready for pt to pick up on 05/19/15 after pt's appt with Dr. Lanell Persons.

## 2015-05-18 NOTE — Telephone Encounter (Signed)
Please let him know that the letter is ready for pick up.  Walter Wilkins  05/18/2015

## 2015-05-18 NOTE — Telephone Encounter (Signed)
Request "letter for insurance that includes date he was diagnosed and treatment plan.  Scheduled tomorrow at 11:40 AM with Dr. Lanell Persons.  Lives in [place], [place].  Can pick letter up tomorrow since he'll be here."  Will notify Dr. Burr Medico of this request.

## 2015-05-19 ENCOUNTER — Ambulatory Visit
Admission: RE | Admit: 2015-05-19 | Discharge: 2015-05-19 | Disposition: A | Discharge status: Home / Self Care | Payer: Commercial Managed Care - PPO | Source: Ambulatory Visit | Attending: Radiation Oncology | Admitting: Radiation Oncology

## 2015-05-19 ENCOUNTER — Encounter: Payer: Self-pay | Admitting: Radiation Oncology

## 2015-05-19 VITALS — BP 133/63 | HR 50 | Temp 97.6°F | Resp 12 | Ht 72.0 in | Wt 244.0 lb

## 2015-05-19 DIAGNOSIS — C7931 Secondary malignant neoplasm of brain: Secondary | ICD-10-CM

## 2015-05-19 DIAGNOSIS — G9389 Other specified disorders of brain: Secondary | ICD-10-CM

## 2015-05-19 MED ORDER — DEXAMETHASONE 2 MG PO TABS: ORAL_TABLET | ORAL | Status: DC

## 2015-05-19 NOTE — Progress Notes (Signed)
Radiation Oncology         [phone]) [phone] ________________________________  Name: Walter Wilkins MRN: [id number]  Date: 05/19/2015  DOB: 12/21/1956  Follow-Up Visit Note  outpatient  CC: Yong Channel, MD  Orson Eva, MD  Diagnosis and Prior Radiotherapy: C79.31 Brain metastasis - non small cell lung cancer  Indication for treatment:  palliative       Radiation treatment dates:   04/10/2015  Site/dose:   Left superior frontal 52m target was treated to a prescription dose of 18 Gy.     Beams/energy:  SRS using 4 Rapid Arc VMAT Beams / 6MV FFF  Narrative:  The patient returns today for routine follow-up. Started Crizotinib; he has an ALK mutation. He denies having any pain. He reports taking decadron 4 mg once daily. He is wondering about tapering off of it. He is also taking keppra twice daily. He reports occasional nausea, with the last episode yesterday. He is taking Zofran as needed. He reports not sleeping well. He reports his appetite is improving. He reports occasional dizziness and problems with balance. He has had 2 headaches with pressure on top of his head with his last episode about 2 days ago. He uses cold compresses around his eyes which help. He reports having limited concentration which is better in the morning. He reports trouble starting to speak sometimes and noticed he has become irritable and does not have "a filter" when he speaks. He reports fatigue and shortness of breath. He is asking for a letter for disability. The pt also states he feels depressed and anxious. Reports that his depression was present even before his diagnosis and has gotten worse post diagnosis. Dr. [facility]arle [facility] sees him for his depression.  ALLERGIES:  has No Known Allergies.  Meds: Current Outpatient Prescriptions  Medication Sig Dispense Refill  . ALPRAZolam (XANAX) 0.25 MG tablet Take 0.25 mg by mouth 2 (two) times daily as needed for anxiety. Last Refill 08/22/2014    . atorvastatin  (LIPITOR) 40 MG tablet Take 40 mg by mouth daily. Last Refill 08/22/2014    . blood glucose meter kit and supplies KIT Dispense based on patient and insurance preference. Use up to four times daily as directed. (FOR ICD-9 250.00, 250.01). 1 each 0  . crizotinib (XALKORI) 250 MG capsule Take 1 capsule (250 mg total) by mouth 2 (two) times daily. 60 capsule 5  . dexamethasone (DECADRON) 2 MG tablet Take 1 tablet daily (227m through June 12. On June 13, take 1/2 tablet daily. On July 4th, stop taking this medication. 40 tablet 0  . Insulin Glargine (LANTUS SOLOSTAR) 100 UNIT/ML Solostar Pen Inject 5 Units into the skin daily at 10 pm. (Patient taking differently: Inject 10 Units into the skin every morning. Then  15 units in the  PM.) 15 mL 0  . Insulin Pen Needle 32G X 4 MM MISC Use with insulin pen as directed to dispense insulin 100 each 1  . levETIRAcetam (KEPPRA) 500 MG tablet Take 1 tablet (500 mg total) by mouth 2 (two) times daily. 60 tablet 2  . lisinopril (PRINIVIL,ZESTRIL) 5 MG tablet Take 5 mg by mouth daily. Last Refill 08/10/2014    . metFORMIN (GLUCOPHAGE) 1000 MG tablet Take 1,000 mg by mouth 2 (two) times daily with a meal. Last refill 08/23/2014    . mirtazapine (REMERON) 15 MG tablet Take 1 tablet (15 mg total) by mouth at bedtime. 30 tablet 0  . omeprazole (PRILOSEC) 20 MG capsule Take 20 mg by  mouth daily. Last Refill 08/22/2014    . ondansetron (ZOFRAN) 8 MG tablet Take 1 tablet (8 mg total) by mouth every 8 (eight) hours as needed for nausea or vomiting. 20 tablet 0  . sertraline (ZOLOFT) 100 MG tablet Take 100 mg by mouth 2 (two) times daily. Last refill 08/22/2014    . traZODone (DESYREL) 50 MG tablet Take 50 mg by mouth at bedtime.    . folic acid (FOLVITE) 1 MG tablet Take 1 tablet (1 mg total) by mouth daily. (Patient not taking: Reported on 05/19/2015) 30 tablet 1  . LORazepam (ATIVAN) 1 MG tablet Take 1 tablet 30 min before MRI or wearing radiotherapy mask (Patient not taking:  Reported on 05/19/2015) 5 tablet 0   No current facility-administered medications for this encounter.    Physical Findings: The patient is in no acute distress. Patient is alert and oriented.  height is 6' (1.829 m) and weight is 244 lb (110.678 kg). His oral temperature is 97.6 F (36.4 C). His blood pressure is 133/63 and his pulse is 50. His respiration is 12 and oxygen saturation is 100%. . General: Alert and oriented, in no acute distress HEENT: Head is normocephalic. Extraocular movements are intact. Oropharynx is clear with no thrush. Neck: Neck is supple, no palpable cervical or supraclavicular lymphadenopathy. Heart: Regular in rate and rhythm  +murmur Chest: Clear to auscultation bilaterally, with no rhonchi, wheezes, or rales. Abdomen: Soft, nontender, nondistended, with no rigidity or guarding. Extremities: No cyanosis or edema in the arms or ankles. Has some ecchymosis on his arms. Lymphatics: see Neck Exam Skin: No concerning lesions. Musculoskeletal: symmetric strength and muscle tone throughout. Strength intact through all extremities. Neurologic: Cranial nerves II through XII are grossly intact. No obvious focalities. Speech is fluent. Coordination is intact. Psychiatric: Judgment and insight are intact. Affect is appropriate.  Lab Findings: Lab Results  Component Value Date   WBC 5.8 05/08/2015   HGB 14.9 05/08/2015   HCT 44.6 05/08/2015   MCV 87.9 05/08/2015   PLT 117* 05/08/2015    Radiographic Findings: No results found.  Impression/Plan:  1 month status post radiosurgery to the brain. Plan to re-scan his brain in 2 months with an MRI and f/u soon thereafter. Notified the pt of the brain cancer support group and that he could go if he wished. The pt is married and I also advised the pt to tell his wife about joining a caregiver support group. I will reach out to [facility] to contact the pt about upcoming support groups. I have prescribed dexamethasone as a 2  mg tablet. Starting tomorrow, the pt is to take 1 tablet by mouth daily through June 12th. Starting June 13th, he is to take 1/2 a tablet daily and then stop on July 4th. This taper may help his feelings of irritability.  Disability letter provided today.  Refer to neurology re: pt noting cognitive decline immediately after seizure without improvement, since.  This document serves as a record of services personally performed by Eppie Gibson, MD. It was created on her behalf by Darcus Austin, a trained medical scribe. The creation of this record is based on the scribe's personal observations and the provider's statements to them. This document has been checked and approved by the attending provider.     _____________________________________   Eppie Gibson, MD

## 2015-05-19 NOTE — Progress Notes (Signed)
Juluis Mire here for follow up.  He denies having any pain.  He reports taking decadron 4 mg once daily.  He is wondering about tapering off of it.  He is also taking keppra bid.  He reports occasional nausea, with the last episode yesterday.  He taking Zofran prn.  He reports not sleeping well.  He reports his appetite is improving.  He reports occasional dizziness and problems with balance.  He has had a few headaches with pressure on top of his head.  He uses cold compresses around his eyes which help.  He reports having limited concentration which is better in the morning.  He reports trouble starting to speak sometimes.  He reports fatigue and shortness of breath.  He is asking for a letter from Dr. Isidore Moos for disability.  BP 133/63 mmHg  Pulse 50  Temp(Src) 97.6 F (36.4 C) (Oral)  Resp 12  Ht 6' (1.829 m)  Wt 244 lb (110.678 kg)  BMI 33.09 kg/m2  SpO2 100%

## 2015-05-29 ENCOUNTER — Other Ambulatory Visit: Payer: Self-pay | Admitting: Hematology

## 2015-05-31 ENCOUNTER — Other Ambulatory Visit: Payer: Self-pay | Admitting: Radiation Therapy

## 2015-05-31 DIAGNOSIS — C7931 Secondary malignant neoplasm of brain: Secondary | ICD-10-CM

## 2015-06-05 ENCOUNTER — Encounter: Payer: Self-pay | Admitting: Hematology

## 2015-06-05 ENCOUNTER — Other Ambulatory Visit (HOSPITAL_BASED_OUTPATIENT_CLINIC_OR_DEPARTMENT_OTHER): Payer: Commercial Managed Care - PPO

## 2015-06-05 ENCOUNTER — Telehealth: Payer: Self-pay | Admitting: Hematology

## 2015-06-05 ENCOUNTER — Ambulatory Visit (HOSPITAL_BASED_OUTPATIENT_CLINIC_OR_DEPARTMENT_OTHER): Payer: Commercial Managed Care - PPO | Admitting: Hematology

## 2015-06-05 ENCOUNTER — Ambulatory Visit (HOSPITAL_BASED_OUTPATIENT_CLINIC_OR_DEPARTMENT_OTHER): Payer: Commercial Managed Care - PPO

## 2015-06-05 VITALS — BP 123/76 | HR 74 | Temp 98.1°F | Resp 16 | Ht 72.0 in | Wt 254.4 lb

## 2015-06-05 DIAGNOSIS — C7951 Secondary malignant neoplasm of bone: Secondary | ICD-10-CM | POA: Diagnosis not present

## 2015-06-05 DIAGNOSIS — Z7951 Long term (current) use of inhaled steroids: Secondary | ICD-10-CM

## 2015-06-05 DIAGNOSIS — C3492 Malignant neoplasm of unspecified part of left bronchus or lung: Secondary | ICD-10-CM

## 2015-06-05 DIAGNOSIS — C787 Secondary malignant neoplasm of liver and intrahepatic bile duct: Secondary | ICD-10-CM

## 2015-06-05 DIAGNOSIS — D696 Thrombocytopenia, unspecified: Secondary | ICD-10-CM

## 2015-06-05 DIAGNOSIS — C3432 Malignant neoplasm of lower lobe, left bronchus or lung: Secondary | ICD-10-CM | POA: Diagnosis not present

## 2015-06-05 DIAGNOSIS — C7931 Secondary malignant neoplasm of brain: Secondary | ICD-10-CM

## 2015-06-05 LAB — COMPREHENSIVE METABOLIC PANEL (CC13)
ALT: 31 U/L (ref 0–55)
AST: 18 U/L (ref 5–34)
Albumin: 2.7 g/dL — ABNORMAL LOW (ref 3.5–5.0)
Alkaline Phosphatase: 140 U/L (ref 40–150)
Anion Gap: 10 mEq/L (ref 3–11)
BUN: 15.3 mg/dL (ref 7.0–26.0)
CALCIUM: 7.6 mg/dL — AB (ref 8.4–10.4)
CHLORIDE: 104 meq/L (ref 98–109)
CO2: 25 mEq/L (ref 22–29)
CREATININE: 0.8 mg/dL (ref 0.7–1.3)
Glucose: 132 mg/dl (ref 70–140)
POTASSIUM: 3.8 meq/L (ref 3.5–5.1)
Sodium: 138 mEq/L (ref 136–145)
Total Bilirubin: 0.44 mg/dL (ref 0.20–1.20)
Total Protein: 5.4 g/dL — ABNORMAL LOW (ref 6.4–8.3)

## 2015-06-05 LAB — CBC & DIFF AND RETIC
BASO%: 0.4 % (ref 0.0–2.0)
BASOS ABS: 0 10*3/uL (ref 0.0–0.1)
EOS%: 4.4 % (ref 0.0–7.0)
Eosinophils Absolute: 0.4 10*3/uL (ref 0.0–0.5)
HEMATOCRIT: 42.1 % (ref 38.4–49.9)
HEMOGLOBIN: 13.8 g/dL (ref 13.0–17.1)
Immature Retic Fract: 13.6 % — ABNORMAL HIGH (ref 3.00–10.60)
LYMPH%: 17.7 % (ref 14.0–49.0)
MCH: 29.8 pg (ref 27.2–33.4)
MCHC: 32.8 g/dL (ref 32.0–36.0)
MCV: 90.9 fL (ref 79.3–98.0)
MONO#: 0.8 10*3/uL (ref 0.1–0.9)
MONO%: 9.6 % (ref 0.0–14.0)
NEUT#: 5.8 10*3/uL (ref 1.5–6.5)
NEUT%: 67.9 % (ref 39.0–75.0)
PLATELETS: 104 10*3/uL — AB (ref 140–400)
RBC: 4.63 10*6/uL (ref 4.20–5.82)
RDW: 16.8 % — ABNORMAL HIGH (ref 11.0–14.6)
Retic %: 2.93 % — ABNORMAL HIGH (ref 0.80–1.80)
Retic Ct Abs: 135.66 10*3/uL — ABNORMAL HIGH (ref 34.80–93.90)
WBC: 8.5 10*3/uL (ref 4.0–10.3)
lymph#: 1.5 10*3/uL (ref 0.9–3.3)

## 2015-06-05 MED ORDER — DENOSUMAB 120 MG/1.7ML ~~LOC~~ SOLN
120.0000 mg | Freq: Once | SUBCUTANEOUS | Status: AC
  Administered 2015-06-05: 120 mg via SUBCUTANEOUS
  Filled 2015-06-05: qty 1.7

## 2015-06-05 NOTE — Progress Notes (Signed)
[place]  [phone]) [phone] Fax:(336) [place] Note   Patient Care Team: [name]. [name], MD as PCP - General (Internal Medicine) 06/05/2015  CHIEF COMPLAINTS:  Follow up newly diagnosed metastatic lung cancer  Oncology History   Metastatic lung cancer (metastasis from lung to other site)   Staging form: Lung, AJCC 7th Edition     Clinical: Stage IV (T3, N3, M1b) - Unsigned        Metastatic lung cancer (metastasis from lung to other site)   03/24/2015 Imaging brian MRI 1.9 cm high left frontal lobe mass with minimal surrounding edema   03/24/2015 Imaging CT showed Multiple low-density lesions in the liver worrisome for metastatic disease. Consolidative infiltrate in the left lower lobe with interstitial infiltrate in the left upper lobe. Single enlarged left hilar lymph node.   03/27/2015 Initial Diagnosis Metastatic lung cancer (metastasis from lung to other site)   03/27/2015 Pathology Results Metastatic adenocarcinoma, IHC positive for CK 7, TTF-1 and Napsin A. Negative for CK 20, CD X2, PSA, consistent with lung primary   03/27/2015 Miscellaneous Foundation One test showed (+) EML4-ALK fusion (variant 2), CDKN2A/B loss, SMAD4 R361H.    04/07/2015 Imaging PET scan showed hypermetabolic soft tissue infiltrates in the left lower lobe lung, bilateral mediastinal adenopathy,widespread metastatic disease, including to thoracic, low cervical nodes, liver and bones.   04/10/2015 - 04/10/2015 Radiation Therapy SRS to the brain met    04/27/2015 -  Chemotherapy Crizotinib 276m bid      HISTORY OF PRESENTING ILLNESS:  JMichae Grimley59y.o. male is here because of newly diagnosed metastatic lung cancer.  He has been in good health until 3 month ago, when he started having intermittent nausea, and gradual weight loss a total of 18 lbs since then, although some are intentional by cutting back diet. He denies any vomiting, abdominal pain or discomfort, or change  of his bowel habit.  He presented with syncope, right after he noticed some titch of left face, and rolling over of his eyes. He will FJosefa Halfon the ground and lost consciousness. This was witnessed by his wife and other people, 911 was called and paramedic staff underwent CPR for a total of 30 minutes. The cardiac monitor shows that he was in asystole. [facility] He workup in the ambulance, and did not have any significant in neurology deficit afterwards. He was seen by cardiology in the neurology service, cardiac catheterization was negative for coronary artery disease. He underwent a CT of head which showed a 1.9 cm lesion in the left frontal lobe with surrounding edema. CT of the chest reviewed no PE, but significant infiltrative consolidation in the left lower lobe, it was felt to be aspiration pneumonia. CT of the abdomen reviewed multiple diffuse liver metastasis. He underwent a liver biopsy which showed adenocarcinoma.   He was a discharge to home. He has been doing well overall since hospital discharge. He has some residual pain from the rib fracture from CPR. He denies any cough, dyspnea, or other symptoms. He has mild fatigue and low appetite, which has been chronic since his gastric bypass surgery in 2011.   CURRENT THERAPY: Presented to 50 mg twice daily, started on 04/27/2015  INTERIM HISTORY: JTamarireturns for follow-up. He is clincially stable overall. He has moderate fatigue, able to do routine activities, but he is not very physically active. He has been gradually weaning off the dexamethasone, he is  currently on 1 mg once daily. He is supposed to stop on July 4. He has some muscle soreness on bilateral side, with some weakness also. He felt as a pool side one week ago, had mild pain at right hip and elbow, which are resolved now. He has mild dyspnea on moderate exertion. Denies any cough, chest pain or any other pain, or other symptoms. His  appetite is moderate, weight is stable. He still has some blurry vision, stable.    MEDICAL HISTORY:  Past Medical History  Diagnosis Date  . Diabetes mellitus without complication   . H/O gastric bypass     a. ~2011 at [facility].  . Acid reflux   . Seizures 03/23/15  . Brain mass 03/24/15    ct head - left frontal lobe mass consistent with a brain metastasis    SURGICAL HISTORY: Past Surgical History  Procedure Laterality Date  . Left heart catheterization with coronary angiogram N/A 03/23/2015    Procedure: LEFT HEART CATHETERIZATION WITH CORONARY ANGIOGRAM;  Surgeon: Burnell Blanks, MD;  Location[facility] C[facility] LAB;  Service: Cardiovascular;  Laterality: N/A;  . Gastric bypass  2011  . Liver biopsy  03/27/15  . Appendectomy    . Cholecystectomy    . Right knee osroscopy Right     SOCIAL HISTORY: History   Social History  . Marital Status: Unknown    Spouse Name: N/A  . Number of Children: 2  . Years of Education: N/A   Occupational History  . Barrister's clerk    Social History Main Topics  . Smoking status: Uses cigar   . Smokeless tobacco: Not on file  . Alcohol Use: 0.0 oz/week    0 Standard drinks or equivalent per week     Comment: Occasionally socially - once a week or less  . Drug Use: No  . Sexual Activity: Not on file   Other Topics Concern  . Not on file   Social History Narrative    FAMILY HISTORY: Family History  Problem Relation Age of Onset  . Valvular heart disease Father     H/o pig valve  . Cancer Father     skin cancer   . Sudden death Neg Hx     No family history of sudden cardiac death  . Breast cancer Mother   . Cancer Mother 51    breast cancer   . Cancer Brother 42    base of tongue   . Cancer Maternal Uncle     throat cancer   . Cancer Maternal Uncle 90    colon cancer, and prostate cancer   . Cancer Cousin     lung cancer     ALLERGIES:  has No Known Allergies.  MEDICATIONS:  Current Outpatient Prescriptions    Medication Sig Dispense Refill  . ALPRAZolam (XANAX) 0.25 MG tablet Take 0.25 mg by mouth 2 (two) times daily as needed for anxiety. Last Refill 08/22/2014    . atorvastatin (LIPITOR) 40 MG tablet Take 40 mg by mouth daily. Last Refill 08/22/2014    . blood glucose meter kit and supplies KIT Dispense based on patient and insurance preference. Use up to four times daily as directed. (FOR ICD-9 250.00, 250.01). 1 each 0  . crizotinib (XALKORI) 250 MG capsule Take 1 capsule (250 mg total) by mouth 2 (two) times daily. 60 capsule 5  . dexamethasone (DECADRON) 2 MG tablet Take 1 tablet daily (68m) through June 12. On June 13, take 1/2 tablet  daily. On July 4th, stop taking this medication. 40 tablet 0  . Insulin Glargine (LANTUS SOLOSTAR) 100 UNIT/ML Solostar Pen Inject 5 Units into the skin daily at 10 pm. (Patient taking differently: Inject 10 Units into the skin every morning. Then  15 units in the  PM.) 15 mL 0  . Insulin Pen Needle 32G X 4 MM MISC Use with insulin pen as directed to dispense insulin 100 each 1  . levETIRAcetam (KEPPRA) 500 MG tablet Take 1 tablet (500 mg total) by mouth 2 (two) times daily. 60 tablet 2  . lisinopril (PRINIVIL,ZESTRIL) 5 MG tablet Take 5 mg by mouth daily. Last Refill 08/10/2014    . metFORMIN (GLUCOPHAGE) 1000 MG tablet Take 1,000 mg by mouth 2 (two) times daily with a meal. Last refill 08/23/2014    . mirtazapine (REMERON) 15 MG tablet TAKE 1 TABLET (15 MG TOTAL) BY MOUTH AT BEDTIME. 30 tablet 0  . omeprazole (PRILOSEC) 20 MG capsule Take 20 mg by mouth daily. Last Refill 08/22/2014    . ondansetron (ZOFRAN) 8 MG tablet Take 1 tablet (8 mg total) by mouth every 8 (eight) hours as needed for nausea or vomiting. 20 tablet 0  . sertraline (ZOLOFT) 100 MG tablet Take 100 mg by mouth daily. Last refill 08/22/2014    . traZODone (DESYREL) 50 MG tablet Take 50 mg by mouth at bedtime.    Marland Kitchen LORazepam (ATIVAN) 1 MG tablet Take 1 tablet 30 min before MRI or wearing radiotherapy  mask (Patient not taking: Reported on 05/19/2015) 5 tablet 0   No current facility-administered medications for this visit.    REVIEW OF SYSTEMS:   Constitutional: Denies fevers, chills or abnormal night sweats, (+) weight loss  Eyes: Denies blurriness of vision, double vision or watery eyes Ears, nose, mouth, throat, and face: Denies mucositis or sore throat Respiratory: Denies cough, dyspnea or wheezes Cardiovascular: Denies palpitation, chest discomfort or lower extremity swelling Gastrointestinal:  Mild intermittent nausea, no heartburn or change in bowel habits Skin: Denies abnormal skin rashes Lymphatics: Denies new lymphadenopathy or easy bruising Neurological:Denies numbness, tingling or new weaknesses Behavioral/Psych: Mood is stable, no new changes  All other systems were reviewed with the patient and are negative.  PHYSICAL EXAMINATION: ECOG PERFORMANCE STATUS: 1 - Symptomatic but completely ambulatory  Filed Vitals:   06/05/15 1216  BP: 123/76  Pulse: 74  Temp: 98.1 F (36.7 C)  Resp: 16   Filed Weights   06/05/15 1216  Weight: 254 lb 6.4 oz (115.395 kg)    GENERAL:alert, no distress and comfortable SKIN: skin color, texture, turgor are normal, no rashes or significant lesions EYES: normal, conjunctiva are pink and non-injected, sclera clear OROPHARYNX:no exudate, no erythema and lips, buccal mucosa, and tongue normal  NECK: supple, thyroid normal size, non-tender, without nodularity LYMPH:  no palpable lymphadenopathy in the cervical, axillary or inguinal LUNGS: clear to auscultation and percussion with normal breathing effort HEART: regular rate & rhythm and no murmurs and no lower extremity edema ABDOMEN:abdomen soft, non-tender and normal bowel sounds Musculoskeletal:no cyanosis of digits and no clubbing  PSYCH: alert & oriented x 3 with fluent speech NEURO: no focal motor/sensory deficits  LABORATORY DATA:  I have reviewed the data as listed CBC Latest  Ref Rng 06/05/2015 05/08/2015 04/17/2015  WBC 4.0 - 10.3 10e3/uL 8.5 5.8 12.7(H)  Hemoglobin 13.0 - 17.1 g/dL 13.8 14.9 14.6  Hematocrit 38.4 - 49.9 % 42.1 44.6 43.1  Platelets 140 - 400 10e3/uL 104(L) 117(L) 110(L)  CMP Latest Ref Rng 06/05/2015 05/08/2015 04/17/2015  Glucose 70 - 140 mg/dl 132 143(H) 254(H)  BUN 7.0 - 26.0 mg/dL 15.3 18.0 22.4  Creatinine 0.7 - 1.3 mg/dL 0.8 0.8 1.1  Sodium 136 - 145 mEq/L 138 139 138  Potassium 3.5 - 5.1 mEq/L 3.8 3.9 4.5  Chloride 96 - 112 mmol/L - - -  CO2 22 - 29 mEq/L 25 23 14(L)  Calcium 8.4 - 10.4 mg/dL 7.6(L) 8.3(L) 8.1(L)  Total Protein 6.4 - 8.3 g/dL 5.4(L) 5.9(L) 5.6(L)  Total Bilirubin 0.20 - 1.20 mg/dL 0.44 0.48 0.26  Alkaline Phos 40 - 150 U/L 140 173(H) 248(H)  AST 5 - 34 U/L 18 17 10   ALT 0 - 55 U/L 31 25 25       PATHOLOGY REPORT: Liver, needle/core biopsy, right lobe 03/27/2015  - METASTATIC ADENOCARCINOMA, SEE COMMENT. Microscopic Comment The adenocarcinoma demonstrates the following immunophenotype Cytokeratin 7 - strong diffuse expression. Cytokeratin 20 - negative expression. TTF-1 - patchy moderate strong expression. Napsin A - strong diffuse expression. CDX-2 - negative expression. PSA - negative expression. Overall, the morphology and immunophenotype are that of metastatic adenocarcinoma, primary to lung. There is tumor available for ancillary tumor testing. The case is reviewed with Dr. Lyndon Code who concurs. The case was discussed with Dr Burr Medico on 03/29/2015. (CRR:gt:ecj, 03/29/15)  RADIOGRAPHIC STUDIES: I have personally reviewed the radiological images as listed and agreed with the findings in the report.  Ct Head Wo Contrast 03/24/2015    IMPRESSION: 1.9 cm high left frontal lobe mass with minimal surrounding edema, most consistent with a brain metastasis given findings on concurrent chest CT. Further evaluation with brain MRI (without and with contrast) is recommended.  These results were called by telephone at the time of  interpretation on 03/24/2015 at 12:25 pm to PA [facility] , who verbally acknowledged these results.   Electronically Signed   By: Logan Bores   On: 03/24/2015 12:32   Ct Angio Chest Pe W/cm &/or Wo Cm 03/24/2015   IMPRESSION: 1. Multiple low-density lesions in the liver worrisome for metastatic disease. 2. No pulmonary emboli. 3. Consolidative infiltrate in the left lower lobe with interstitial infiltrate in the left upper lobe. I suspect both of these represent aspiration pneumonitis. 4. Single enlarged left hilar lymph node. 5. Multiple bilateral anterior rib fractures most likely secondary to recent CPR.   Electronically Signed   By: Lorriane Shire M.D.   On: 03/24/2015 11:51   Mr Jeri Cos GY Contrast 03/25/2015    IMPRESSION: Solitary 17 x 15 mm hemorrhagic LEFT frontal lobe mass corresponding to CT abnormality with imaging characteristics of metastasis.  Otherwise unremarkable MRI of the brain with contrast for age.   Electronically Signed   By: Elon Alas   On: 03/25/2015 03:41   Ct Abdomen Pelvis W Contrast 03/25/2015    IMPRESSION: 1. Multiple low-density liver lesions highly suspicious for metastatic disease. Largest lesion lies in the right lobe measuring 3.4 cm in greatest transverse dimension. 2. No other evidence of metastatic disease below the diaphragm. No acute findings within the abdomen or pelvis. 3. Lung base findings that are unchanged from the previous day's CT angiogram. Consolidation/atelectasis in left lower lobe. Probable adenopathy in the posterior inferior left mediastinum. Prior PET-CT evaluated a small left lower lobe nodule. This nodule was at the left lung base. This nodule is not visualized currently--it may be obscured by the lung consolidation. Consider repeat follow-up PET-CT to evaluate the source of presumed metastatic disease  to the liver.   Electronically Signed   By: Lajean Manes M.D.   On: 03/25/2015 12:30   PET 04/07/2015 IMPRESSION: 1. Widespread metastatic  disease, including to thoracic/low cervical nodes, liver, and bones. 2. Favor primary bronchogenic carcinoma in the left lower lobe. 3. Incidental findings, as detailed above. 4. Suspicion of nodal metastasis about the deep right parotid gland versus a metachronous primary neoplasm. Of doubtful clinical significance.  ASSESSMENT & PLAN:  59 year old Caucasian male with minimal smoking history, obesity status post gastric bypass surgery, diabetes, presented with cardiac arrest and presumed seizure. Imaging study showed left lower lobe infiltrative consultation, left hilar adenopathy, diffuse liver metastasis and aortic lobe brain metastasis.  1. 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 Images were reviewed in person. -The liver biopsy immunostain were consistent with lung primary. Although the left lower lobe infiltrative consultation could be aspiration pneumonia, and more likely this presents a primary lung cancer. Patient did have a lung nodule in the left parietal lobe in the past, and he will bring his previous CT scan to me next visit. -I reviewed the PET scan findings with patient, unfortunately he has widespread metastatic disease. -s/p brain SRS. -I discussed the Foundation one genetic test results with patient and his wife. His tumor contains ALK-EML4 translocation, which can be targeted with ALK inhibitors. I recommend Crizotinib as his first-line treatment, which has showed superior response and him better tolerance than chemotherapy. The potential side effect of Crizotinib, which includes but not limited to, nausea, vomiting, diarrhea, skin rash, photosensitivity, vision disturbance, liver toxicity, etc, were discussed with patient, and he agrees to proceed -His tolerating crit is unable well, we'll continue. Lab reviewed.  -Repeat CT scan in 3-4 weeks.  2.  Brain mets -s/p SRS, follow-up with radiation oncology -He is scheduled to repeat a brain MRI on July 8  3. Bone mets -Xgeva, every 4 weeks, second dose today  4. history of cardiac arrest -Likely related to his breathing metastasis and seizure -Cardiac cath was negative for coronary artery disease  5. Seizure, secondary to brain metastasis -Continue Keppra  -He will follow up with neurologist next week. He has not tried since the seizure.  6.  diabetes  -Continue follow-up with primary care physician.   7. Thrombocytopenia -Possibly related to his bone metastasis. -Stable, we'll continue monitoring. He knows to avoid fall and injury.   Follow-up: I'll see him back in 4 weeks with lab and Xgeva. CT chest, abdomen and pelvis with IV contrast before next visit.   All questions were answered. The patient knows to call the clinic with any problems, questions or concerns. I spent 25 minutes counseling the patient face to face. The total time spent in the appointment was 30 minutes and more than 50% was on counseling.     [name], MD 06/05/2015 5:28 PM

## 2015-06-05 NOTE — Telephone Encounter (Signed)
Gave patient avs report and appointments for July. Central radiology scheduling will call re ct - patient aware.

## 2015-06-09 ENCOUNTER — Encounter: Payer: Self-pay | Admitting: Neurology

## 2015-06-09 ENCOUNTER — Ambulatory Visit (INDEPENDENT_AMBULATORY_CARE_PROVIDER_SITE_OTHER): Payer: Commercial Managed Care - PPO | Admitting: Neurology

## 2015-06-09 VITALS — BP 113/75 | HR 62 | Ht 72.0 in | Wt 258.8 lb

## 2015-06-09 DIAGNOSIS — G939 Disorder of brain, unspecified: Secondary | ICD-10-CM

## 2015-06-09 DIAGNOSIS — G9389 Other specified disorders of brain: Secondary | ICD-10-CM

## 2015-06-09 DIAGNOSIS — R569 Unspecified convulsions: Secondary | ICD-10-CM

## 2015-06-09 MED ORDER — LEVETIRACETAM 500 MG PO TABS: 500.0000 mg | ORAL_TABLET | Freq: Two times a day (BID) | ORAL | Status: DC

## 2015-06-09 NOTE — Progress Notes (Signed)
Reason for visit: Seizures  Referring physician: Dr. Lilia Pro. is a 59 y.o. male  History of present illness:  Walter Wilkins is a 59 year old right-handed white male with a history of obesity and diabetes. The patient had a blackout event that occurred on 03/23/2015. The patient was at a baseball field at the time, he went up to go to the bathroom, and then suddenly blacked out. The patient has no real recollection of what happened, he had no warning. The patient did bite his tongue, and lost control of the bladder. The patient was thought to be in cardiac arrest, and he was admitted to the hospital for further evaluation. 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 The patient was placed on Keppra. The patient was found to have metastatic lung cancer with metastatic disease to the liver. He has engaged in radiation therapy to the brain. He has not had any further seizure events. He is on a tapering dose of Decadron, currently on 1 mg daily. He has diabetes, and the Decadron has significantly increased his blood sugars. The patient has not been operating motor vehicle. He has noted some slight weakness of the right leg, gait instability, one fall. The patient denies any numbness of extremities. He has occasional headaches. He has some blurring of vision at times. He reports some urinary urgency, no incontinence of urine. He is scheduled to have a repeat MRI of the brain on 06/20/2015. He is sent to this office for an evaluation.  Past Medical History  Diagnosis Date  . Diabetes mellitus without complication   . H/O gastric bypass     a. ~2011 at [facility].  . Acid reflux   . Seizures 03/23/15  . Brain mass 03/24/15    ct head - left frontal lobe mass consistent with a brain metastasis  . Cancer     mass on lung, brain, sinuses, liver and bones    Past Surgical History  Procedure Laterality  Date  . Left heart catheterization with coronary angiogram N/A 03/23/2015    Procedure: LEFT HEART CATHETERIZATION WITH CORONARY ANGIOGRAM;  Surgeon: Burnell Blanks, MD;  Location: [facility] CATH LAB;  Service: Cardiovascular;  Laterality: N/A;  . Gastric bypass  2011  . Liver biopsy  03/27/15  . Appendectomy    . Cholecystectomy    . Right knee osroscopy Right     Family History  Problem Relation Age of Onset  . Valvular heart disease Father     H/o pig valve  . Cancer Father     skin cancer   . Sudden death Neg Hx     No family history of sudden cardiac death  . Breast cancer Mother   . Cancer Mother 25    breast cancer   . Cancer Brother 42    base of tongue   . Cancer Maternal Uncle     throat cancer   . Cancer Maternal Uncle 90    colon cancer, and prostate cancer   . Cancer Cousin     lung cancer     Social history:  reports that he has never smoked. He has never used smokeless tobacco. He reports that he drinks alcohol. He reports that he does not use illicit drugs.  Medications:  Prior to Admission medications   Medication Sig Start Date End Date Taking? Authorizing Provider  ALPRAZolam Duanne Moron) 0.25  MG tablet Take 0.25 mg by mouth 2 (two) times daily as needed for anxiety. Last Refill 08/22/2014   Yes Historical Provider, MD  atorvastatin (LIPITOR) 40 MG tablet Take 40 mg by mouth daily. Last Refill 08/22/2014   Yes Historical Provider, MD  blood glucose meter kit and supplies KIT Dispense based on patient and insurance preference. Use up to four times daily as directed. (FOR ICD-9 250.00, 250.01). 03/28/15  Yes Orson Eva, MD  crizotinib Hulda Humphrey) 250 MG capsule Take 1 capsule (250 mg total) by mouth 2 (two) times daily. 04/13/15  Yes [name], MD  dexamethasone (DECADRON) 2 MG tablet Take 1 tablet daily (4m) through June 12. On June 13, take 1/2 tablet daily. On July 4th, stop taking this medication. 05/19/15  Yes SEppie Gibson MD  Insulin Glargine (LANTUS SOLOSTAR) 100  UNIT/ML Solostar Pen Inject 5 Units into the skin daily at 10 pm. Patient taking differently: Inject 10 Units into the skin every morning. Then  15 units in the  PM. 03/28/15  Yes DOrson Eva MD  Insulin Pen Needle 32G X 4 MM MISC Use with insulin pen as directed to dispense insulin 03/28/15  Yes DOrson Eva MD  levETIRAcetam (KEPPRA) 500 MG tablet Take 1 tablet (500 mg total) by mouth 2 (two) times daily. 06/09/15  Yes CKathrynn Ducking MD  lisinopril (PRINIVIL,ZESTRIL) 5 MG tablet Take 5 mg by mouth daily. Last Refill 08/10/2014   Yes Historical Provider, MD  LORazepam (ATIVAN) 1 MG tablet Take 1 tablet 30 min before MRI or wearing radiotherapy mask 03/29/15  Yes SEppie Gibson MD  metFORMIN (GLUCOPHAGE) 1000 MG tablet Take 1,000 mg by mouth 2 (two) times daily with a meal. Last refill 08/23/2014   Yes Historical Provider, MD  mirtazapine (REMERON) 15 MG tablet TAKE 1 TABLET (15 MG TOTAL) BY MOUTH AT BEDTIME. 05/31/15  Yes YTruitt Merle MD  omeprazole (PRILOSEC) 20 MG capsule Take 20 mg by mouth daily. Last Refill 08/22/2014   Yes Historical Provider, MD  ondansetron (ZOFRAN) 8 MG tablet Take 1 tablet (8 mg total) by mouth every 8 (eight) hours as needed for nausea or vomiting. 04/17/15  Yes YTruitt Merle MD  sertraline (ZOLOFT) 100 MG tablet Take 100 mg by mouth daily. Last refill 08/22/2014   Yes Historical Provider, MD  traZODone (DESYREL) 50 MG tablet Take 50 mg by mouth at bedtime.   Yes Historical Provider, MD     No Known Allergies  ROS:  Out of a complete 14 system review of symptoms, the patient complains only of the following symptoms, and all other reviewed systems are negative.  Fatigue Birthmarks, moles Blurred vision Shortness of breath Diarrhea Joint pain, achy muscles, runny nose Confusion, headache, weakness, seizures Depression, anxiety Insomnia, sleepiness  Blood pressure 113/75, pulse 62, height 6' (1.829 m), weight 258 lb 12.8 oz (117.391 kg).  Physical Exam  General: The patient is  alert and cooperative at the time of the examination. The patient is markedly obese.  Eyes: Pupils are equal, round, and reactive to light. Discs are flat bilaterally.  Neck: The neck is supple, no carotid bruits are noted.  Respiratory: The respiratory examination is clear.  Cardiovascular: The cardiovascular examination reveals a regular rate and rhythm, no obvious murmurs or rubs are noted.  Skin: Extremities are with 1+ edema at the ankles bilaterally.  Neurologic Exam  Mental status: The patient is alert and oriented x 3 at the time of the examination. The patient has apparent normal recent and  remote memory, with an apparently normal attention span and concentration ability.  Cranial nerves: Facial symmetry is present. There is good sensation of the face to pinprick and soft touch bilaterally. The strength of the facial muscles and the muscles to head turning and shoulder shrug are normal bilaterally. Speech is well enunciated, no aphasia or dysarthria is noted. Extraocular movements are full. Visual fields are full. The tongue is midline, and the patient has symmetric elevation of the soft palate. No obvious hearing deficits are noted.  Motor: The motor testing reveals 5 over 5 strength of all 4 extremities. Good symmetric motor tone is noted throughout.  Sensory: Sensory testing is intact to pinprick, soft touch, vibration sensation, and position sense on all 4 extremities, with exception of some slight decrease in position sense on the right foot. No evidence of extinction is noted.  Coordination: Cerebellar testing reveals good finger-nose-finger and heel-to-shin bilaterally.  Gait and station: Gait is normal. Tandem gait is slightly unsteady. Romberg is negative. No drift is seen.  Reflexes: Deep tendon reflexes are symmetric and normal bilaterally. Toes are downgoing bilaterally.   Assessment/Plan:  1. Lung cancer, metastatic to liver and brain  2. Seizure  The patient  is on a tapering dose of Decadron. The patient has been taking Keppra 500 mg twice daily, tolerating the medication well. I would not stop this drug. This prescription was sent in for the patient. He will follow-up in 2 or 3 months. The MRI of the brain will be repeated on 06/20/2015. The patient will contact me if any episodes of seizures occur. He has not operate a motor vehicle.  Jill Alexanders MD 06/12/2015 9:35 PM  [facility] [address] [place], [place] [address]  Phone [phone] Fax [phone]

## 2015-06-09 NOTE — Patient Instructions (Signed)

## 2015-06-15 ENCOUNTER — Telehealth: Payer: Self-pay | Admitting: *Deleted

## 2015-06-15 ENCOUNTER — Ambulatory Visit (HOSPITAL_BASED_OUTPATIENT_CLINIC_OR_DEPARTMENT_OTHER)
Admission: RE | Admit: 2015-06-15 | Discharge: 2015-06-15 | Disposition: A | Discharge status: Home / Self Care | Payer: Commercial Managed Care - PPO | Source: Ambulatory Visit | Attending: Hematology | Admitting: Hematology

## 2015-06-15 ENCOUNTER — Encounter (HOSPITAL_COMMUNITY): Payer: Self-pay | Admitting: Emergency Medicine

## 2015-06-15 ENCOUNTER — Encounter: Payer: Self-pay | Admitting: Hematology

## 2015-06-15 ENCOUNTER — Other Ambulatory Visit: Payer: Self-pay | Admitting: *Deleted

## 2015-06-15 ENCOUNTER — Emergency Department (HOSPITAL_COMMUNITY): Payer: Commercial Managed Care - PPO

## 2015-06-15 ENCOUNTER — Other Ambulatory Visit: Payer: Self-pay

## 2015-06-15 ENCOUNTER — Emergency Department (HOSPITAL_COMMUNITY)
Admission: EM | Admit: 2015-06-15 | Discharge: 2015-06-15 | Discharge status: Against Medical Advice | Payer: Commercial Managed Care - PPO | Attending: Emergency Medicine | Admitting: Emergency Medicine

## 2015-06-15 DIAGNOSIS — Z859 Personal history of malignant neoplasm, unspecified: Secondary | ICD-10-CM | POA: Insufficient documentation

## 2015-06-15 DIAGNOSIS — I82409 Acute embolism and thrombosis of unspecified deep veins of unspecified lower extremity: Secondary | ICD-10-CM | POA: Insufficient documentation

## 2015-06-15 DIAGNOSIS — C7931 Secondary malignant neoplasm of brain: Secondary | ICD-10-CM | POA: Diagnosis not present

## 2015-06-15 DIAGNOSIS — M7989 Other specified soft tissue disorders: Secondary | ICD-10-CM

## 2015-06-15 DIAGNOSIS — E119 Type 2 diabetes mellitus without complications: Secondary | ICD-10-CM | POA: Diagnosis not present

## 2015-06-15 DIAGNOSIS — C799 Secondary malignant neoplasm of unspecified site: Secondary | ICD-10-CM | POA: Diagnosis not present

## 2015-06-15 DIAGNOSIS — C3490 Malignant neoplasm of unspecified part of unspecified bronchus or lung: Secondary | ICD-10-CM

## 2015-06-15 DIAGNOSIS — R0602 Shortness of breath: Secondary | ICD-10-CM | POA: Diagnosis not present

## 2015-06-15 LAB — I-STAT TROPONIN, ED: Troponin i, poc: 0 ng/mL (ref 0.00–0.08)

## 2015-06-15 NOTE — ED Notes (Signed)
Patient's wife and patient left per previous nurse first.

## 2015-06-15 NOTE — ED Notes (Signed)
Pt sent here by cancer center for eval of bilateral DVT; pt sts some SOB with exertion; pt with bilateral leg swelling

## 2015-06-15 NOTE — Progress Notes (Signed)
Per [employer] starting Jul 24 2015. Walter Wilkins will need prior auth.

## 2015-06-15 NOTE — Telephone Encounter (Signed)
Notified pt that appt for doppler is scheduled for 4 pm today at [facility].  Instructed to register @ 3:45 pm in the [facility].

## 2015-06-15 NOTE — Telephone Encounter (Signed)
Received call from [place] with [facility] stating that pt has positive DVT bilaterally throughout as high as the common femoral on the right & the common profunda on the left.  Reported to Dr Burr Medico & instructions given to Katharine Look to have pt go to [facility] ED & be evaluated for PE since high risk & for lovenox instructions.

## 2015-06-15 NOTE — Progress Notes (Signed)
Bilateral lower extremity venous duplex completed.  Right:  DVT noted common femoral, profunda, popliteal, posterior tibial, and peroneal veins.  No evidence of superficial thrombosis.  No Baker's cyst.  Left: DVT noted profunda, popliteal, posterior tibial, and peroneal veins.  There appears to be superficial thrombosis in the greater saphenous vein.  No Baker's cyst.

## 2015-06-15 NOTE — Telephone Encounter (Signed)
Received call from [place] stating that pt's spouse called reporting that pt had swelling of legs & L> R with tenderness in back of left calf.  Called & spoke with Ivin Booty & she reports that there is no redness or warmth but she is concerned for blood clots due to pt sitting a lot. Discussed with Dr Burr Medico & bilateral LE doppler ordered.

## 2015-06-15 NOTE — ED Notes (Signed)
Pt was stuck twice by [place], phlebotomy and then by me; I only was able to draw troponin

## 2015-06-16 ENCOUNTER — Telehealth: Payer: Self-pay | Admitting: *Deleted

## 2015-06-16 NOTE — Telephone Encounter (Signed)
Several calls made to pt's home # & cell # & unable to reach pt or spouse.  Called spouse's work #-[employer] Ivin Booty stated that they waited 2 hours in the ED/[facility] & they left & went to [facility] was admitted with PE & was placed on lovenox. Reported to Dr Burr Medico.

## 2015-06-16 NOTE — Telephone Encounter (Signed)
Dr. Burr Medico & this RN reviewed chart for ED notes & determined that pt had EKG & CXR but apparently left ED yest eve.  Verified with ED Manager/Misty.  MD was not notified.  Attempted to call pt at home & cell # & no response.  Will attempt again.

## 2015-06-20 ENCOUNTER — Encounter: Payer: Self-pay | Admitting: Radiation Therapy

## 2015-06-20 ENCOUNTER — Ambulatory Visit
Admission: RE | Admit: 2015-06-20 | Discharge: 2015-06-20 | Disposition: A | Discharge status: Home / Self Care | Payer: Commercial Managed Care - PPO | Source: Ambulatory Visit | Attending: Radiation Oncology | Admitting: Radiation Oncology

## 2015-06-20 DIAGNOSIS — C7931 Secondary malignant neoplasm of brain: Secondary | ICD-10-CM

## 2015-06-20 MED ORDER — GADOBENATE DIMEGLUMINE 529 MG/ML IV SOLN
20.0000 mL | Freq: Once | INTRAVENOUS | Status: AC | PRN
  Administered 2015-06-20: 20 mL via INTRAVENOUS

## 2015-06-21 ENCOUNTER — Ambulatory Visit
Admission: RE | Admit: 2015-06-21 | Discharge: 2015-06-21 | Disposition: A | Discharge status: Home / Self Care | Payer: Commercial Managed Care - PPO | Source: Ambulatory Visit | Attending: Radiation Oncology | Admitting: Radiation Oncology

## 2015-06-21 ENCOUNTER — Encounter: Payer: Self-pay | Admitting: Radiation Oncology

## 2015-06-21 VITALS — BP 101/79 | HR 68 | Temp 98.0°F | Resp 20 | Ht 72.0 in | Wt 256.0 lb

## 2015-06-21 DIAGNOSIS — C7931 Secondary malignant neoplasm of brain: Secondary | ICD-10-CM

## 2015-06-21 NOTE — Progress Notes (Signed)
Radiation Oncology         [phone]) [phone] ________________________________  Name: Walter Wilkins. MRN: [id number]  Date: 06/21/2015  DOB: Feb 09, 1956  Follow-Up Visit Note  outpatient  CC: Yong Channel, MD  Orson Eva, MD  Diagnosis and Prior Radiotherapy: C79.31 Brain metastasis - non small cell lung cancer  Indication for treatment:  palliative       Radiation treatment dates:   04/10/2015  Site/dose:   Left superior frontal 54m target was treated to a prescription dose of 18 Gy.     Beams/energy:  SRS using 4 Rapid Arc VMAT Beams / 6MV FFF  Narrative:  The patient returns today for routine follow-up. Started Crizotinib; he has an ALK mutation. On Lovenox injections daily, nausea last night feels due to blue cheese dressing, took zofran , no c/o sob, had frontal h/a yesterday , no pain just soreness left calf, [facility] Monday, energy level poor. He is currently taking 1 mg of dexamethazone daily with plans to stop this on July 4th.  His headaches are unchanged, they are not constant, mostly tolerable while not getting any worse. His neurologist,  put him back on anti seizure medications. He feels as though he doesn't have the patience he used to have and becomes frustrated easily. Discussed that this may be due to the steroid medication or the situation he is in with his diagnosis. He will be traveling a lot this summer. He was curious if he could play golf, advised that he could and to stay hydrated. Advised to get up every hour during long travel to reduce the risk of blood clots. He plans to wear compression hose while travelling.   ALLERGIES:  has No Known Allergies.  Meds: Current Outpatient Prescriptions  Medication Sig Dispense Refill  . ALPRAZolam (XANAX) 0.25 MG tablet Take 0.25 mg by mouth 3 (three) times daily as needed for anxiety. Last Refill 08/22/2014    . atorvastatin (LIPITOR) 40 MG tablet Take 40 mg by mouth daily. Last Refill 08/22/2014    . blood  glucose meter kit and supplies KIT Dispense based on patient and insurance preference. Use up to four times daily as directed. (FOR ICD-9 250.00, 250.01). 1 each 0  . CALCIUM & MAGNESIUM CARBONATES PO Take 500 mg by mouth 2 (two) times daily.    . crizotinib (XALKORI) 250 MG capsule Take 1 capsule (250 mg total) by mouth 2 (two) times daily. 60 capsule 5  . dexamethasone (DECADRON) 2 MG tablet Take 1 tablet daily (246m through June 12. On June 13, take 1/2 tablet daily. On July 4th, stop taking this medication. 40 tablet 0  . enoxaparin (LOVENOX) 120 MG/0.8ML injection Inject 120 mg into the skin daily.    . Marland KitchenevETIRAcetam (KEPPRA) 500 MG tablet Take 1 tablet (500 mg total) by mouth 2 (two) times daily. 60 tablet 3  . levofloxacin (LEVAQUIN) 750 MG tablet Take 750 mg by mouth daily.    . Marland Kitchenisinopril (PRINIVIL,ZESTRIL) 5 MG tablet Take 5 mg by mouth daily. Last Refill 08/10/2014    . metFORMIN (GLUCOPHAGE) 1000 MG tablet Take 1,000 mg by mouth 2 (two) times daily with a meal. Last refill 08/23/2014    . mirtazapine (REMERON) 15 MG tablet TAKE 1 TABLET (15 MG TOTAL) BY MOUTH AT BEDTIME. 30 tablet 0  . omeprazole (PRILOSEC) 20 MG capsule Take 20 mg by mouth daily. Last Refill 08/22/2014    . ondansetron (ZOFRAN) 8 MG tablet Take 1 tablet (8 mg  total) by mouth every 8 (eight) hours as needed for nausea or vomiting. 20 tablet 0  . traZODone (DESYREL) 50 MG tablet Take 50 mg by mouth at bedtime.    . Insulin Glargine (LANTUS SOLOSTAR) 100 UNIT/ML Solostar Pen Inject 5 Units into the skin daily at 10 pm. (Patient not taking: Reported on 06/21/2015) 15 mL 0  . Insulin Pen Needle 32G X 4 MM MISC Use with insulin pen as directed to dispense insulin (Patient not taking: Reported on 06/21/2015) 100 each 1  . LORazepam (ATIVAN) 1 MG tablet Take 1 tablet 30 min before MRI or wearing radiotherapy mask (Patient not taking: Reported on 06/21/2015) 5 tablet 0  . sertraline (ZOLOFT) 100 MG tablet Take 100 mg by mouth daily.  Last refill 08/22/2014     No current facility-administered medications for this encounter.    Physical Findings: The patient is in no acute distress. Patient is alert and oriented.  height is 6' (1.829 m) and weight is 256 lb (116.121 kg). His oral temperature is 98 F (36.7 C). His blood pressure is 101/79 and his pulse is 68. His respiration is 20. Marland Kitchen General: Alert and oriented, in no acute distress HEENT: Head is normocephalic. Extraocular movements are intact. Oropharynx is clear with no thrush. Neck: Neck is supple, no palpable cervical or supraclavicular lymphadenopathy. Heart: Regular in rate and rhythm. Chest: Clear to auscultation bilaterally, with no rhonchi, wheezes, or rales. Abdomen: Soft, nontender, nondistended, with no rigidity or guarding. Extremities: No cyanosis or edema in the arms. Some edema in the left ankle. Lymphatics: see Neck Exam Skin: No concerning lesions. Musculoskeletal: symmetric strength and muscle tone throughout. Strength intact through all extremities. Neurologic: Cranial nerves II through XII are grossly intact. No obvious focalities. Speech is fluent. Coordination is intact. Strength is intact and coordination is intact. No cranial nerve deficits. Psychiatric: Judgment and insight are intact. Affect is appropriate.  Lab Findings: Lab Results  Component Value Date   WBC 8.5 06/05/2015   HGB 13.8 06/05/2015   HCT 42.1 06/05/2015   MCV 90.9 06/05/2015   PLT 104* 06/05/2015    Radiographic Findings: Dg Chest 2 View  06/15/2015   CLINICAL DATA:  DVT, shortness of breast since 03/23/2015, history lung cancer metastatic to bone, liver, brain, and lymph nodes ; history diabetes mellitus, GERD  EXAM: CHEST  2 VIEW  COMPARISON:  03/23/2015  FINDINGS: Normal heart size, mediastinal contours and pulmonary vascularity.  Chronic accentuation of interstitial markings in the mid to lower LEFT lung versus RIGHT, could represent chronic interstitial disease,  persistent asymmetric interstitial infiltrate or edema, or lymphangitic tumor spread.  Remaining lungs clear.  Tiny pleural effusion blunts the posterior LEFT costophrenic angle.  No pneumothorax.  IMPRESSION: Tiny LEFT pleural effusion.  Chronic accentuation of interstitial markings in the mid to lower LEFT lung question infection, fibrosis, edema or lymphangitic tumor.   Electronically Signed   By: Lavonia Dana M.D.   On: 06/15/2015 16:40   Mr Jeri Cos CN Contrast  06/20/2015   CLINICAL DATA:  Metastatic lung cancer. SRS to left frontal brain metastasis 04/10/2015.  EXAM: MRI HEAD WITHOUT AND WITH CONTRAST  TECHNIQUE: Multiplanar, multiecho pulse sequences of the brain and surrounding structures were obtained without and with intravenous contrast.  CONTRAST:  57m MULTIHANCE GADOBENATE DIMEGLUMINE 529 MG/ML IV SOLN  COMPARISON:  03/30/2015 brain MRI  FINDINGS: There is no evidence of acute infarct, midline shift, or extra-axial fluid collection. There is mild generalized cerebral atrophy. Minimal chronic  small vessel ischemic disease is noted in the cerebral white matter, less than is often seen in patients of this age.  Left superior frontal gyrus enhancing metastasis has mildly decreased in size, measuring 1.7 x 1.6 cm (series 10, image 136, previously 2.4 x 1.9 cm). Associated chronic blood products are again noted. T2 hyperintensity/ edema in the surrounding left frontal white matter has slightly increased. No new enhancing brain lesions are identified.  Orbits are unremarkable. Paranasal sinuses and mastoid air cells are clear. 1.2 cm lesion in the clivus appear slightly smaller than on the prior study.  IMPRESSION: 1. Decreased size of left frontal metastasis. Slightly increased surrounding edema may be due to radiation therapy. 2. No evidence of new brain metastases. 3. Slightly decreased size of osseous metastasis in the clivus.   Electronically Signed   By: Logan Bores   On: 06/20/2015 16:24     Impression/Plan: Good response to Brain SRS. No new lesions. I reviewed his MRI personally.  Rescan the brain in 3 months with followup. Possible collaborative visit with Dr. Vertell Limber to go over the MRI together.  He knows to call if issues arise before then.  This document serves as a record of services personally performed by Eppie Gibson, MD. It was created on her behalf by Arlyce Harman, a trained medical scribe. The creation of this record is based on the scribe's personal observations and the provider's statements to them. This document has been checked and approved by the attending provider.  _____________________________________   Eppie Gibson, MD

## 2015-06-21 NOTE — Progress Notes (Signed)
Follow up s/p Brain 04/10/15, MRI done 06/20/15 here for results, on Lovenox injections daily, nausea last night feels due to blue cheese dressing, took zofran , no c/o sob, had frontal h/a yesterday , no pain just soreness left calf, [facility] Monday, energy level poor, 8:01 AM BP 101/79 mmHg  Pulse 68  Temp(Src) 98 F (36.7 C) (Oral)  Resp 20  Ht 6' (1.829 m)  Wt 256 lb (116.121 kg)  BMI 34.71 kg/m2  Wt Readings from Last 3 Encounters:  06/21/15 256 lb (116.121 kg)  06/20/15 250 lb (113.399 kg)  06/09/15 258 lb 12.8 oz (117.391 kg)

## 2015-06-23 ENCOUNTER — Ambulatory Visit: Payer: Commercial Managed Care - PPO | Admitting: Radiation Oncology

## 2015-06-28 ENCOUNTER — Other Ambulatory Visit (HOSPITAL_BASED_OUTPATIENT_CLINIC_OR_DEPARTMENT_OTHER): Payer: Commercial Managed Care - PPO

## 2015-06-28 ENCOUNTER — Ambulatory Visit (HOSPITAL_COMMUNITY)
Admission: RE | Admit: 2015-06-28 | Discharge: 2015-06-28 | Disposition: A | Discharge status: Home / Self Care | Payer: Commercial Managed Care - PPO | Source: Ambulatory Visit | Attending: Hematology | Admitting: Hematology

## 2015-06-28 ENCOUNTER — Encounter (HOSPITAL_COMMUNITY): Payer: Self-pay

## 2015-06-28 ENCOUNTER — Other Ambulatory Visit: Payer: Self-pay | Admitting: Hematology

## 2015-06-28 DIAGNOSIS — R221 Localized swelling, mass and lump, neck: Secondary | ICD-10-CM | POA: Insufficient documentation

## 2015-06-28 DIAGNOSIS — C787 Secondary malignant neoplasm of liver and intrahepatic bile duct: Secondary | ICD-10-CM | POA: Diagnosis not present

## 2015-06-28 DIAGNOSIS — J984 Other disorders of lung: Secondary | ICD-10-CM | POA: Insufficient documentation

## 2015-06-28 DIAGNOSIS — I2782 Chronic pulmonary embolism: Secondary | ICD-10-CM | POA: Insufficient documentation

## 2015-06-28 DIAGNOSIS — C7931 Secondary malignant neoplasm of brain: Secondary | ICD-10-CM | POA: Diagnosis not present

## 2015-06-28 DIAGNOSIS — C3432 Malignant neoplasm of lower lobe, left bronchus or lung: Secondary | ICD-10-CM | POA: Diagnosis not present

## 2015-06-28 DIAGNOSIS — C7951 Secondary malignant neoplasm of bone: Secondary | ICD-10-CM

## 2015-06-28 DIAGNOSIS — C3492 Malignant neoplasm of unspecified part of left bronchus or lung: Secondary | ICD-10-CM

## 2015-06-28 LAB — COMPREHENSIVE METABOLIC PANEL (CC13)
ALBUMIN: 2.6 g/dL — AB (ref 3.5–5.0)
ALT: 31 U/L (ref 0–55)
ANION GAP: 8 meq/L (ref 3–11)
AST: 30 U/L (ref 5–34)
Alkaline Phosphatase: 108 U/L (ref 40–150)
BUN: 11 mg/dL (ref 7.0–26.0)
CHLORIDE: 106 meq/L (ref 98–109)
CO2: 26 mEq/L (ref 22–29)
Calcium: 8.6 mg/dL (ref 8.4–10.4)
Creatinine: 0.8 mg/dL (ref 0.7–1.3)
GLUCOSE: 84 mg/dL (ref 70–140)
POTASSIUM: 4.2 meq/L (ref 3.5–5.1)
Sodium: 140 mEq/L (ref 136–145)
Total Bilirubin: 0.46 mg/dL (ref 0.20–1.20)
Total Protein: 5.4 g/dL — ABNORMAL LOW (ref 6.4–8.3)

## 2015-06-28 LAB — CBC & DIFF AND RETIC
BASO%: 0.8 % (ref 0.0–2.0)
BASOS ABS: 0.1 10*3/uL (ref 0.0–0.1)
EOS ABS: 0.3 10*3/uL (ref 0.0–0.5)
EOS%: 4.2 % (ref 0.0–7.0)
HEMATOCRIT: 38.5 % (ref 38.4–49.9)
HEMOGLOBIN: 12.6 g/dL — AB (ref 13.0–17.1)
IMMATURE RETIC FRACT: 22.2 % — AB (ref 3.00–10.60)
LYMPH#: 1.4 10*3/uL (ref 0.9–3.3)
LYMPH%: 21.1 % (ref 14.0–49.0)
MCH: 30.1 pg (ref 27.2–33.4)
MCHC: 32.7 g/dL (ref 32.0–36.0)
MCV: 91.9 fL (ref 79.3–98.0)
MONO#: 0.4 10*3/uL (ref 0.1–0.9)
MONO%: 6.5 % (ref 0.0–14.0)
NEUT%: 67.4 % (ref 39.0–75.0)
NEUTROS ABS: 4.4 10*3/uL (ref 1.5–6.5)
Platelets: 242 10*3/uL (ref 140–400)
RBC: 4.19 10*6/uL — ABNORMAL LOW (ref 4.20–5.82)
RDW: 17.1 % — ABNORMAL HIGH (ref 11.0–14.6)
Retic %: 3.31 % — ABNORMAL HIGH (ref 0.80–1.80)
Retic Ct Abs: 138.69 10*3/uL — ABNORMAL HIGH (ref 34.80–93.90)
WBC: 6.5 10*3/uL (ref 4.0–10.3)

## 2015-06-28 MED ORDER — IOHEXOL 300 MG/ML  SOLN
100.0000 mL | Freq: Once | INTRAMUSCULAR | Status: AC | PRN
  Administered 2015-06-28: 100 mL via INTRAVENOUS

## 2015-06-30 ENCOUNTER — Other Ambulatory Visit: Payer: Commercial Managed Care - PPO

## 2015-07-03 ENCOUNTER — Ambulatory Visit: Payer: Commercial Managed Care - PPO

## 2015-07-03 ENCOUNTER — Ambulatory Visit (HOSPITAL_BASED_OUTPATIENT_CLINIC_OR_DEPARTMENT_OTHER): Payer: Commercial Managed Care - PPO | Admitting: Hematology

## 2015-07-03 ENCOUNTER — Ambulatory Visit (HOSPITAL_BASED_OUTPATIENT_CLINIC_OR_DEPARTMENT_OTHER): Payer: Commercial Managed Care - PPO

## 2015-07-03 ENCOUNTER — Telehealth: Payer: Self-pay | Admitting: Hematology

## 2015-07-03 VITALS — BP 109/68 | HR 65 | Temp 98.4°F | Resp 18 | Ht 72.0 in | Wt 258.0 lb

## 2015-07-03 DIAGNOSIS — C7931 Secondary malignant neoplasm of brain: Secondary | ICD-10-CM | POA: Diagnosis not present

## 2015-07-03 DIAGNOSIS — I2699 Other pulmonary embolism without acute cor pulmonale: Secondary | ICD-10-CM

## 2015-07-03 DIAGNOSIS — C3432 Malignant neoplasm of lower lobe, left bronchus or lung: Secondary | ICD-10-CM

## 2015-07-03 DIAGNOSIS — D696 Thrombocytopenia, unspecified: Secondary | ICD-10-CM

## 2015-07-03 DIAGNOSIS — C3490 Malignant neoplasm of unspecified part of unspecified bronchus or lung: Secondary | ICD-10-CM

## 2015-07-03 DIAGNOSIS — C3492 Malignant neoplasm of unspecified part of left bronchus or lung: Secondary | ICD-10-CM

## 2015-07-03 DIAGNOSIS — C7951 Secondary malignant neoplasm of bone: Secondary | ICD-10-CM | POA: Diagnosis not present

## 2015-07-03 DIAGNOSIS — C787 Secondary malignant neoplasm of liver and intrahepatic bile duct: Secondary | ICD-10-CM | POA: Diagnosis not present

## 2015-07-03 DIAGNOSIS — I82409 Acute embolism and thrombosis of unspecified deep veins of unspecified lower extremity: Secondary | ICD-10-CM

## 2015-07-03 MED ORDER — ENOXAPARIN SODIUM 120 MG/0.8ML ~~LOC~~ SOLN: 120.0000 mg | Freq: Every day | SUBCUTANEOUS | Status: DC

## 2015-07-03 MED ORDER — ONDANSETRON HCL 8 MG PO TABS: 8.0000 mg | ORAL_TABLET | Freq: Three times a day (TID) | ORAL | Status: AC | PRN

## 2015-07-03 MED ORDER — MIRTAZAPINE 30 MG PO TABS: 30.0000 mg | ORAL_TABLET | Freq: Every day | ORAL | Status: DC

## 2015-07-03 MED ORDER — ALPRAZOLAM 0.25 MG PO TABS: 0.2500 mg | ORAL_TABLET | Freq: Three times a day (TID) | ORAL | Status: DC | PRN

## 2015-07-03 MED ORDER — DENOSUMAB 120 MG/1.7ML ~~LOC~~ SOLN
120.0000 mg | Freq: Once | SUBCUTANEOUS | Status: AC
  Administered 2015-07-03: 120 mg via SUBCUTANEOUS
  Filled 2015-07-03: qty 1.7

## 2015-07-03 NOTE — Telephone Encounter (Signed)
Pt confirmed labs/ov per 07/11 POF, gave pt AVS and Calendar.... KJ °

## 2015-07-05 ENCOUNTER — Other Ambulatory Visit: Payer: Self-pay | Admitting: Hematology

## 2015-07-06 ENCOUNTER — Encounter: Payer: Self-pay | Admitting: Hematology

## 2015-07-06 NOTE — Progress Notes (Signed)
[place]  [phone] Fax:([phone]) [place] Note   Patient Care Team: [name]. [name], MD as PCP - General (Internal Medicine) [name], MD as Consulting Physician (Hematology) 07/03/2015  CHIEF COMPLAINTS:  Follow up metastatic lung cancer  Oncology History   Metastatic lung cancer (metastasis from lung to other site)   Staging form: Lung, AJCC 7th Edition     Clinical: Stage IV (T3, N3, M1b) - Unsigned        Metastatic lung cancer (metastasis from lung to other site)   03/24/2015 Imaging brian MRI 1.9 cm high left frontal lobe mass with minimal surrounding edema   03/24/2015 Imaging CT showed Multiple low-density lesions in the liver worrisome for metastatic disease. Consolidative infiltrate in the left lower lobe with interstitial infiltrate in the left upper lobe. Single enlarged left hilar lymph node.   03/27/2015 Initial Diagnosis Metastatic lung cancer (metastasis from lung to other site)   03/27/2015 Pathology Results Metastatic adenocarcinoma, IHC positive for CK 7, TTF-1 and Napsin A. Negative for CK 20, CD X2, PSA, consistent with lung primary   03/27/2015 Miscellaneous Foundation One test showed (+) EML4-ALK fusion (variant 2), CDKN2A/B loss, SMAD4 R361H.    04/07/2015 Imaging PET scan showed hypermetabolic soft tissue infiltrates in the left lower lobe lung, bilateral mediastinal adenopathy,widespread metastatic disease, including to thoracic, low cervical nodes, liver and bones.   04/10/2015 - 04/10/2015 Radiation Therapy SRS to the brain met    04/27/2015 -  Chemotherapy Crizotinib 265m bid      HISTORY OF PRESENTING ILLNESS:  Walter Wilkins 59y.o. male is here because of newly diagnosed metastatic lung cancer.  He has been in good health until 3 month ago, when he started having intermittent nausea, and gradual weight loss a total of 18 lbs since then, although some are intentional by cutting back diet. He denies any vomiting,  abdominal pain or discomfort, or change of his bowel habit.  He presented with syncope, right after he noticed some titch of left face, and rolling over of his eyes. He will FJosefa Halfon the ground and lost consciousness. This was witnessed by his wife and other people, 911 was called and paramedic staff underwent CPR for a total of 30 minutes. The cardiac monitor shows that he was in asystole. [facility] He workup in the ambulance, and did not have any significant in neurology deficit afterwards. He was seen by cardiology in the neurology service, cardiac catheterization was negative for coronary artery disease. He underwent a CT of head which showed a 1.9 cm lesion in the left frontal lobe with surrounding edema. CT of the chest reviewed no PE, but significant infiltrative consolidation in the left lower lobe, it was felt to be aspiration pneumonia. CT of the abdomen reviewed multiple diffuse liver metastasis. He underwent a liver biopsy which showed adenocarcinoma.   He was a discharge to home. He has been doing well overall since hospital discharge. He has some residual pain from the rib fracture from CPR. He denies any cough, dyspnea, or other symptoms. He has mild fatigue and low appetite, which has been chronic since his gastric bypass surgery in 2011.   CURRENT THERAPY: Crizotinib 250 mg twice daily, started on 04/27/2015  INTERIM HISTORY: JSkylerreturns for follow-up and discuss restaging CT scan result. He developed bilateral leg swelling on 06/15/2015, Doppler showed bilateral extensive DVT and CT showed pulmonary embolism. He was  hospitalized in [facility] and was discharged home on Lovenox 120 mg daily. His tolerated it well and his leg swelling much improved. He has stable moderate fatigue, able to function well at home, but not physically active. He feels moderately depressed, denies any suicidal ideas. Mild shortness breasts on  exertion is stable. No other new symptoms.  MEDICAL HISTORY:  Past Medical History  Diagnosis Date  . Diabetes mellitus without complication   . H/O gastric bypass     a. ~2011 at [facility].  . Acid reflux   . Seizures 03/23/15  . Brain mass 03/24/15    ct head - left frontal lobe mass consistent with a brain metastasis  . Cancer     mass on lung, brain, sinuses, liver and bones    SURGICAL HISTORY: Past Surgical History  Procedure Laterality Date  . Left heart catheterization with coronary angiogram N/A 03/23/2015    Procedure: LEFT HEART CATHETERIZATION WITH CORONARY ANGIOGRAM;  Surgeon: Burnell Blanks, MD;  Location: [facility] CATH LAB;  Service: Cardiovascular;  Laterality: N/A;  . Gastric bypass  2011  . Liver biopsy  03/27/15  . Appendectomy    . Cholecystectomy    . Right knee osroscopy Right     SOCIAL HISTORY: History   Social History  . Marital Status: Unknown    Spouse Name: N/A  . Number of Children: 2  . Years of Education: N/A   Occupational History  . Barrister's clerk    Social History Main Topics  . Smoking status: Uses cigar   . Smokeless tobacco: Not on file  . Alcohol Use: 0.0 oz/week    0 Standard drinks or equivalent per week     Comment: Occasionally socially - once a week or less  . Drug Use: No  . Sexual Activity: Not on file   Other Topics Concern  . Not on file   Social History Narrative    FAMILY HISTORY: Family History  Problem Relation Age of Onset  . Valvular heart disease Father     H/o pig valve  . Cancer Father     skin cancer   . Sudden death Neg Hx     No family history of sudden cardiac death  . Breast cancer Mother   . Cancer Mother 31    breast cancer   . Cancer Brother 42    base of tongue   . Cancer Maternal Uncle     throat cancer   . Cancer Maternal Uncle 90    colon cancer, and prostate cancer   . Cancer Cousin     lung cancer     ALLERGIES:  has No Known Allergies.  MEDICATIONS:  Current  Outpatient Prescriptions  Medication Sig Dispense Refill  . ALPRAZolam (XANAX) 0.25 MG tablet Take 1 tablet (0.25 mg total) by mouth 3 (three) times daily as needed for anxiety. Last Refill 08/22/2014 60 tablet 3  . atorvastatin (LIPITOR) 40 MG tablet Take 40 mg by mouth daily. Last Refill 08/22/2014    . blood glucose meter kit and supplies KIT Dispense based on patient and insurance preference. Use up to four times daily as directed. (FOR ICD-9 250.00, 250.01). 1 each 0  . CALCIUM & MAGNESIUM CARBONATES PO Take 500 mg by mouth 2 (two) times daily.    . crizotinib (XALKORI) 250 MG capsule Take 1 capsule (250 mg total) by mouth 2 (two) times daily. 60 capsule 5  . dexamethasone (DECADRON) 2 MG tablet Take 1  tablet daily (49m) through June 12. On June 13, take 1/2 tablet daily. On July 4th, stop taking this medication. 40 tablet 0  . enoxaparin (LOVENOX) 120 MG/0.8ML injection Inject 0.8 mLs (120 mg total) into the skin daily. 60 Syringe 3  . Insulin Glargine (LANTUS SOLOSTAR) 100 UNIT/ML Solostar Pen Inject 5 Units into the skin daily at 10 pm. (Patient taking differently: Inject 5 Units into the skin daily at 10 pm. Pt states dose varies) 15 mL 0  . Insulin Pen Needle 32G X 4 MM MISC Use with insulin pen as directed to dispense insulin 100 each 1  . levETIRAcetam (KEPPRA) 500 MG tablet Take 1 tablet (500 mg total) by mouth 2 (two) times daily. 60 tablet 3  . lisinopril (PRINIVIL,ZESTRIL) 5 MG tablet Take 5 mg by mouth daily. Last Refill 08/10/2014    . LORazepam (ATIVAN) 1 MG tablet Take 1 tablet 30 min before MRI or wearing radiotherapy mask 5 tablet 0  . metFORMIN (GLUCOPHAGE) 1000 MG tablet Take 1,000 mg by mouth 2 (two) times daily with a meal. Last refill 08/23/2014    . omeprazole (PRILOSEC) 20 MG capsule Take 20 mg by mouth daily. Last Refill 08/22/2014    . ondansetron (ZOFRAN) 8 MG tablet Take 1 tablet (8 mg total) by mouth every 8 (eight) hours as needed for nausea or vomiting. 30 tablet 3  .  sertraline (ZOLOFT) 100 MG tablet Take 100 mg by mouth daily. Last refill 08/22/2014    . traZODone (DESYREL) 50 MG tablet Take 50 mg by mouth at bedtime.    . mirtazapine (REMERON) 30 MG tablet Take 1 tablet (30 mg total) by mouth at bedtime. 30 tablet 5   No current facility-administered medications for this visit.    REVIEW OF SYSTEMS:   Constitutional: Denies fevers, chills or abnormal night sweats, (+) weight loss  Eyes: Denies blurriness of vision, double vision or watery eyes Ears, nose, mouth, throat, and face: Denies mucositis or sore throat Respiratory: Denies cough, dyspnea or wheezes Cardiovascular: Denies palpitation, chest discomfort or lower extremity swelling Gastrointestinal:  Mild intermittent nausea, no heartburn or change in bowel habits Skin: Denies abnormal skin rashes Lymphatics: Denies new lymphadenopathy or easy bruising Neurological:Denies numbness, tingling or new weaknesses Behavioral/Psych: Mood is stable, no new changes  All other systems were reviewed with the patient and are negative.  PHYSICAL EXAMINATION: ECOG PERFORMANCE STATUS: 1 - Symptomatic but completely ambulatory  Filed Vitals:   07/03/15 1218  BP: 109/68  Pulse: 65  Temp: 98.4 F (36.9 C)  Resp: 18   Filed Weights   07/03/15 1218  Weight: 258 lb (117.028 kg)    GENERAL:alert, no distress and comfortable SKIN: skin color, texture, turgor are normal, no rashes or significant lesions EYES: normal, conjunctiva are pink and non-injected, sclera clear OROPHARYNX:no exudate, no erythema and lips, buccal mucosa, and tongue normal  NECK: supple, thyroid normal size, non-tender, without nodularity LYMPH:  no palpable lymphadenopathy in the cervical, axillary or inguinal LUNGS: clear to auscultation and percussion with normal breathing effort HEART: regular rate & rhythm and no murmurs and no lower extremity edema ABDOMEN:abdomen soft, non-tender and normal bowel sounds Musculoskeletal:no  cyanosis of digits and no clubbing  PSYCH: alert & oriented x 3 with fluent speech NEURO: no focal motor/sensory deficits  LABORATORY DATA:  I have reviewed the data as listed CBC Latest Ref Rng 06/28/2015 06/05/2015 05/08/2015  WBC 4.0 - 10.3 10e3/uL 6.5 8.5 5.8  Hemoglobin 13.0 - 17.1 g/dL 12.6(L)  13.8 14.9  Hematocrit 38.4 - 49.9 % 38.5 42.1 44.6  Platelets 140 - 400 10e3/uL 242 104(L) 117(L)    CMP Latest Ref Rng 06/28/2015 06/05/2015 05/08/2015  Glucose 70 - 140 mg/dl 84 132 143(H)  BUN 7.0 - 26.0 mg/dL 11.0 15.3 18.0  Creatinine 0.7 - 1.3 mg/dL 0.8 0.8 0.8  Sodium 136 - 145 mEq/L 140 138 139  Potassium 3.5 - 5.1 mEq/L 4.2 3.8 3.9  Chloride 96 - 112 mmol/L - - -  CO2 22 - 29 mEq/L _0 Calcium 8.4 - 10.4 mg/dL 8.6 7.6(L) 8.3(L)  Total Protein 6.4 - 8.3 g/dL 5.4(L) 5.4(L) 5.9(L)  Total Bilirubin 0.20 - 1.20 mg/dL 0.46 0.44 0.48  Alkaline Phos 40 - 150 U/L 108 140 173(H)  AST 5 - 34 U/L _1 ALT 0 - 55 U/L _2 PATHOLOGY REPORT: Liver, needle/core biopsy, right lobe 03/27/2015  - METASTATIC ADENOCARCINOMA, SEE COMMENT. Microscopic Comment The adenocarcinoma demonstrates the following immunophenotype Cytokeratin 7 - strong diffuse expression. Cytokeratin 20 - negative expression. TTF-1 - patchy moderate strong expression. Napsin A - strong diffuse expression. CDX-2 - negative expression. PSA - negative expression. Overall, the morphology and immunophenotype are that of metastatic adenocarcinoma, primary to lung. There is tumor available for ancillary tumor testing. The case is reviewed with Dr. Lyndon Code who concurs. The case was discussed with Dr Burr Medico on 03/29/2015. (CRR:gt:ecj, 03/29/15)  RADIOGRAPHIC STUDIES: I have personally reviewed the radiological images as listed and agreed with the findings in the report.  Ct Head Wo Contrast 03/24/2015    IMPRESSION: 1.9 cm high left frontal lobe mass with minimal surrounding edema, most consistent with a brain metastasis  given findings on concurrent chest CT. Further evaluation with brain MRI (without and with contrast) is recommended.  These results were called by telephone at the time of interpretation on 03/24/2015 at 12:25 pm to [facility] , who verbally acknowledged these results.   Electronically Signed   By: Logan Bores   On: 03/24/2015 12:32   Ct Angio Chest Pe W/cm &/or Wo Cm 03/24/2015   IMPRESSION: 1. Multiple low-density lesions in the liver worrisome for metastatic disease. 2. No pulmonary emboli. 3. Consolidative infiltrate in the left lower lobe with interstitial infiltrate in the left upper lobe. I suspect both of these represent aspiration pneumonitis. 4. Single enlarged left hilar lymph node. 5. Multiple bilateral anterior rib fractures most likely secondary to recent CPR.   Electronically Signed   By: Lorriane Shire M.D.   On: 03/24/2015 11:51   Mr Jeri Cos HG Contrast 03/25/2015    IMPRESSION: Solitary 17 x 15 mm hemorrhagic LEFT frontal lobe mass corresponding to CT abnormality with imaging characteristics of metastasis.  Otherwise unremarkable MRI of the brain with contrast for age.   Electronically Signed   By: Elon Alas   On: 03/25/2015 03:41   Ct Abdomen Pelvis W Contrast 03/25/2015    IMPRESSION: 1. Multiple low-density liver lesions highly suspicious for metastatic disease. Largest lesion lies in the right lobe measuring 3.4 cm in greatest transverse dimension. 2. No other evidence of metastatic disease below the diaphragm. No acute findings within the abdomen or pelvis. 3. Lung base findings that are unchanged from the previous day's CT angiogram. Consolidation/atelectasis in left lower lobe. Probable adenopathy in the posterior inferior left mediastinum. Prior PET-CT evaluated a small left lower lobe nodule. This nodule was at the left lung base. This nodule  is not visualized currently--it may be obscured by the lung consolidation. Consider repeat follow-up PET-CT to evaluate the source of  presumed metastatic disease to the liver.   Electronically Signed   By: Lajean Manes M.D.   On: 03/25/2015 12:30   PET 04/07/2015 IMPRESSION: 1. Widespread metastatic disease, including to thoracic/low cervical nodes, liver, and bones. 2. Favor primary bronchogenic carcinoma in the left lower lobe. 3. Incidental findings, as detailed above. 4. Suspicion of nodal metastasis about the deep right parotid gland versus a metachronous primary neoplasm. Of doubtful clinical significance.  CT chest, abdomen and pelvis with contrast on 06/28/2015  IMPRESSION: 1. Progressive osseous metastatic disease when compared to studies from April. 2. Left paraesophageal mass, left sub carinal nodes and left lower lobe lung lesion are smaller. 3. Persistent right-sided pulmonary embolism. 4. Patchy peripheral airspace disease in both lungs, right greater than left could be related to the pulmonary embolism or could be an inflammatory or infectious process. No definite metastatic pulmonary nodules. 5. Improved hepatic metastatic disease.   ASSESSMENT & PLAN:  59 year old Caucasian male with minimal smoking history, obesity status post gastric bypass surgery, diabetes, presented with cardiac arrest and presumed seizure. Imaging study showed left lower lobe infiltrative consultation, left hilar adenopathy, diffuse liver metastasis and aortic lobe brain metastasis.  1. 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 Images were reviewed in person. -The liver biopsy immunostain were consistent with lung primary. Although the left lower lobe infiltrative consultation could be aspiration pneumonia, and more likely this presents a primary lung cancer. Patient did have a lung nodule in the left parietal lobe in the past, and he will bring his previous CT scan to me next visit. -I reviewed the  PET scan findings with patient, unfortunately he has widespread metastatic disease. -s/p brain SRS. -I discussed the Foundation one genetic test results with patient and his wife. His tumor contains ALK-EML4 translocation, which can be targeted with ALK inhibitors. I recommend Crizotinib as his first-line treatment, which has showed superior response and him better tolerance than chemotherapy. The potential side effect of Crizotinib, which includes but not limited to, nausea, vomiting, diarrhea, skin rash, photosensitivity, vision disturbance, liver toxicity, etc, were discussed with patient, and he agrees to proceed -I reviewed his restaging CT scans. Both his lung, lymph nodes and liver metastasis has improved. CT scan also showed progressive osteo-metastasis, although I think the CT scan is not very reliable to evaluate bone metastasis, he has no clinical symptoms related to bone metastasis -His tolerating crizotinib well, we'll continue. Lab reviewed.  -Repeat PET scan in 3 month.  2. Brain mets -s/p SRS, follow-up with radiation oncology -His restaging brain MRI showed good response to treatment  3. Bone mets -Xgeva, every 4 weeks, third dose today  4. history of cardiac arrest -Likely related to his breathing metastasis and seizure -Cardiac cath was negative for coronary artery disease  5. Seizure, secondary to brain metastasis -Continue Keppra  -He will follow up with neurologist. He has not driven since the seizure.  6.  diabetes  -Continue follow-up with primary care physician.   7. Thrombocytopenia -Possibly related to his bone metastasis. -resolved.  8. DVT and Pulmonary embolism, diagnosed on 06/15/2015 -He was discharged home from hospital with Lovenox 120 mg daily, this is insufficient dose based on his body weight. Recent CT scan also showed persistent right pulmonary embolus -I recommend  him to increase Lovenox to 120 mg every 12 hours. A new prescription was called in  today. -Risk of bleeding was discussed with patient  9. Depression -He is still quite depressed. Denies suicidal ideas -I recommend him to increase dose of mirtazapine to 30 mg at bedtime  Follow-up: I'll see him back in 8 weeks with lab, monthly Xgeva.   All questions were answered. The patient knows to call the clinic with any problems, questions or concerns. I spent 35 minutes counseling the patient face to face. The total time spent in the appointment was 40 minutes and more than 50% was on counseling.     [name], MD 07/03/2015 11:07 PM

## 2015-07-07 ENCOUNTER — Other Ambulatory Visit: Payer: Commercial Managed Care - PPO

## 2015-07-10 ENCOUNTER — Ambulatory Visit: Payer: Self-pay | Admitting: Radiation Oncology

## 2015-07-24 ENCOUNTER — Telehealth: Payer: Self-pay | Admitting: *Deleted

## 2015-07-24 NOTE — Telephone Encounter (Signed)
Faxed response and office notes to [facility] on Fri. 07/21/15 by Janifer Adie, [place]. [facility]  Fax     [phone].

## 2015-07-25 ENCOUNTER — Telehealth: Payer: Self-pay | Admitting: *Deleted

## 2015-07-25 NOTE — Telephone Encounter (Signed)
Received call from Ivin Booty, wife, & she states xanax is not helping Fowler's emotions & he cries all the time.  She states that he also takes some extra doses but it is not helping. He is taking his lovenox bid & she is wondering if Dr Burr Medico would ever do a recheck to see if the blood clot is improving.  She reports that Raford had some swelling on their plan trip to [place] trip to [place] but the swelling gets better with elevation.  She plans to take some time off from work to spend some quality time with her husband.  Call back # is [phone].  Note to Dr. Burr Medico.

## 2015-07-25 NOTE — Telephone Encounter (Signed)
Talked with Walter Wilkins & pt is off of zoloft, stating that he was weaned off she thinks per his PCP.  Dr. Burr Medico suggested that he see a psychologist/psychiatrist & have PCP make referral. She is in agreement with this & will call if this is a problem.

## 2015-07-25 NOTE — Telephone Encounter (Signed)
1)PT.'S WIFE STATES THE XANAX IS NOT HELPING. HER HUSBAND IS "VERY EMOTIONAL". "IS THERE ANYTHING STRONGER"? 2)DOES PT. NEED A FOLLOW UP DOPPLER TO SEE HOW LOVENOX IS WORKING? LEFT MESSAGE ON VOICE MAIL TO RETURN A CALL TO THIS OFFICE CONCERNING A FOLLOW UP APPOINTMENT WITH DR.FENG.

## 2015-07-26 ENCOUNTER — Encounter: Payer: Self-pay | Admitting: Hematology

## 2015-07-26 ENCOUNTER — Telehealth: Payer: Self-pay

## 2015-07-26 NOTE — Progress Notes (Signed)
I faxed prior auth req for xalkori to [facility] . Spec pharmacy to be notified once approved.

## 2015-07-26 NOTE — Telephone Encounter (Signed)
Fax fro[facility] for prior auth request for Xalkori placed in Ebon's box

## 2015-07-26 NOTE — Progress Notes (Signed)
I faxed [facility] appeals notes/labs for appeal on denial for xalkori [phone]

## 2015-07-27 ENCOUNTER — Other Ambulatory Visit: Payer: Self-pay | Admitting: Radiation Therapy

## 2015-07-27 ENCOUNTER — Telehealth: Payer: Self-pay | Admitting: Radiation Therapy

## 2015-07-27 ENCOUNTER — Encounter: Payer: Self-pay | Admitting: Radiation Therapy

## 2015-07-27 ENCOUNTER — Telehealth: Payer: Self-pay | Admitting: *Deleted

## 2015-07-27 DIAGNOSIS — C7931 Secondary malignant neoplasm of brain: Secondary | ICD-10-CM

## 2015-07-27 NOTE — Telephone Encounter (Signed)
Opened in error

## 2015-07-27 NOTE — Progress Notes (Addendum)
1.  Do you need a wheel chair?    no  2. On oxygen? No  3. Have you ever had any surgery in the body part being scanned?  no  4. Have you ever had any surgery on your brain or heart?                    Heart cath but not open heart surgery   5. Have you ever had surgery on your eyes or ears?                          no  6. Do you have a pacemaker or defibrillator?   no  7. Do you have a Neurostimulator?   no  8. Claustrophobic?  no  9. Any risk for metal in eyes?  no  10. Injury by bullet, buckshot, or shrapnel?  no  11. Stent?     no                                                                                                             12. Hx of Cancer?         Yes, Lung with mets to brain                                                      13. Kidney or Liver disease? Yes  14. Hx of Lupus, Rheumatoid Arthritis or Scleroderma?  No  15. IV Antibiotics or long term use of NSAIDS?  No  16. HX of Hypertension? yes  17. Diabetes?  yes  18. Allergy to contrast?  no  19. Recent labs. To be drawn again in September with Med Onc.    Walter Wilkins

## 2015-07-27 NOTE — Telephone Encounter (Signed)
Call received from [facility].  "We received an appeal request from te doctor about this patient and have questions.  Does the patient have locally advanced or locally metastatic cancer?"  This nurse informed [facility] that yes he does due to brain mets diagnosis code.  Call ended.

## 2015-07-31 ENCOUNTER — Ambulatory Visit: Payer: Commercial Managed Care - PPO

## 2015-07-31 ENCOUNTER — Ambulatory Visit (HOSPITAL_BASED_OUTPATIENT_CLINIC_OR_DEPARTMENT_OTHER): Payer: Commercial Managed Care - PPO

## 2015-07-31 ENCOUNTER — Ambulatory Visit (HOSPITAL_BASED_OUTPATIENT_CLINIC_OR_DEPARTMENT_OTHER): Payer: Commercial Managed Care - PPO | Admitting: Hematology

## 2015-07-31 ENCOUNTER — Other Ambulatory Visit (HOSPITAL_BASED_OUTPATIENT_CLINIC_OR_DEPARTMENT_OTHER): Payer: Commercial Managed Care - PPO

## 2015-07-31 ENCOUNTER — Telehealth: Payer: Self-pay | Admitting: Hematology

## 2015-07-31 ENCOUNTER — Encounter: Payer: Self-pay | Admitting: Hematology

## 2015-07-31 ENCOUNTER — Encounter: Payer: Self-pay | Admitting: *Deleted

## 2015-07-31 VITALS — BP 117/73 | HR 65 | Temp 99.7°F | Resp 18 | Ht 72.0 in | Wt 252.9 lb

## 2015-07-31 DIAGNOSIS — C7951 Secondary malignant neoplasm of bone: Secondary | ICD-10-CM | POA: Diagnosis not present

## 2015-07-31 DIAGNOSIS — C7931 Secondary malignant neoplasm of brain: Secondary | ICD-10-CM

## 2015-07-31 DIAGNOSIS — C3432 Malignant neoplasm of lower lobe, left bronchus or lung: Secondary | ICD-10-CM

## 2015-07-31 DIAGNOSIS — C787 Secondary malignant neoplasm of liver and intrahepatic bile duct: Secondary | ICD-10-CM

## 2015-07-31 DIAGNOSIS — F32A Depression, unspecified: Secondary | ICD-10-CM | POA: Insufficient documentation

## 2015-07-31 DIAGNOSIS — C3490 Malignant neoplasm of unspecified part of unspecified bronchus or lung: Secondary | ICD-10-CM

## 2015-07-31 DIAGNOSIS — C3492 Malignant neoplasm of unspecified part of left bronchus or lung: Secondary | ICD-10-CM

## 2015-07-31 DIAGNOSIS — Z86718 Personal history of other venous thrombosis and embolism: Secondary | ICD-10-CM

## 2015-07-31 DIAGNOSIS — F329 Major depressive disorder, single episode, unspecified: Secondary | ICD-10-CM

## 2015-07-31 LAB — COMPREHENSIVE METABOLIC PANEL (CC13)
ALK PHOS: 96 U/L (ref 40–150)
ALT: 19 U/L (ref 0–55)
AST: 23 U/L (ref 5–34)
Albumin: 2.6 g/dL — ABNORMAL LOW (ref 3.5–5.0)
Anion Gap: 6 mEq/L (ref 3–11)
BUN: 9.8 mg/dL (ref 7.0–26.0)
CO2: 25 meq/L (ref 22–29)
Calcium: 7.3 mg/dL — ABNORMAL LOW (ref 8.4–10.4)
Chloride: 110 mEq/L — ABNORMAL HIGH (ref 98–109)
Creatinine: 0.8 mg/dL (ref 0.7–1.3)
EGFR: >90 mL/min/{1.73_m2} (ref >90)
Glucose: 119 mg/dl (ref 70–140)
Potassium: 4.1 mEq/L (ref 3.5–5.1)
Sodium: 142 mEq/L (ref 136–145)
Total Bilirubin: 0.42 mg/dL (ref 0.20–1.20)
Total Protein: 5.3 g/dL — ABNORMAL LOW (ref 6.4–8.3)

## 2015-07-31 LAB — CBC & DIFF AND RETIC
BASO%: 0.4 % (ref 0.0–2.0)
BASOS ABS: 0 10*3/uL (ref 0.0–0.1)
EOS%: 6 % (ref 0.0–7.0)
Eosinophils Absolute: 0.3 10*3/uL (ref 0.0–0.5)
HCT: 39.5 % (ref 38.4–49.9)
HGB: 13.2 g/dL (ref 13.0–17.1)
IMMATURE RETIC FRACT: 15.9 % — AB (ref 3.00–10.60)
LYMPH#: 1.2 10*3/uL (ref 0.9–3.3)
LYMPH%: 23.5 % (ref 14.0–49.0)
MCH: 31.1 pg (ref 27.2–33.4)
MCHC: 33.4 g/dL (ref 32.0–36.0)
MCV: 93.2 fL (ref 79.3–98.0)
MONO#: 0.5 10*3/uL (ref 0.1–0.9)
MONO%: 9.7 % (ref 0.0–14.0)
NEUT%: 60.4 % (ref 39.0–75.0)
NEUTROS ABS: 3.1 10*3/uL (ref 1.5–6.5)
Platelets: 184 10*3/uL (ref 140–400)
RBC: 4.24 10*6/uL (ref 4.20–5.82)
RDW: 14.9 % — ABNORMAL HIGH (ref 11.0–14.6)
RETIC %: 2 % — AB (ref 0.80–1.80)
Retic Ct Abs: 84.8 10*3/uL (ref 34.80–93.90)
WBC: 5.2 10*3/uL (ref 4.0–10.3)

## 2015-07-31 MED ORDER — DENOSUMAB 120 MG/1.7ML ~~LOC~~ SOLN
120.0000 mg | Freq: Once | SUBCUTANEOUS | Status: AC
  Administered 2015-07-31: 120 mg via SUBCUTANEOUS
  Filled 2015-07-31: qty 1.7

## 2015-07-31 NOTE — Progress Notes (Signed)
Per [facility] xalkori approved 07/26/15-07/26/16 [phone]. I will send to medical records.

## 2015-07-31 NOTE — Progress Notes (Signed)
Walter Wilkins  [phone] Fax:([phone] [place] Note   Patient Care Team: [name]. [name], MD as PCP - General (Internal Medicine) [name], MD as Consulting Physician (Hematology) 07/31/2015   CHIEF COMPLAINTS:  Follow up metastatic lung cancer  Oncology History   Metastatic lung cancer (metastasis from lung to other site)   Staging form: Lung, AJCC 7th Edition     Clinical: Stage IV (T3, N3, M1b) - Unsigned        Metastatic lung cancer (metastasis from lung to other site)   03/24/2015 Imaging brian MRI 1.9 cm high left frontal lobe mass with minimal surrounding edema   03/24/2015 Imaging CT showed Multiple low-density lesions in the liver worrisome for metastatic disease. Consolidative infiltrate in the left lower lobe with interstitial infiltrate in the left upper lobe. Single enlarged left hilar lymph node.   03/27/2015 Initial Diagnosis Metastatic lung cancer (metastasis from lung to other site)   03/27/2015 Pathology Results Metastatic adenocarcinoma, IHC positive for CK 7, TTF-1 and Napsin A. Negative for CK 20, CD X2, PSA, consistent with lung primary   03/27/2015 Miscellaneous Foundation One test showed (+) EML4-ALK fusion (variant 2), CDKN2A/B loss, SMAD4 R361H.    04/07/2015 Imaging PET scan showed hypermetabolic soft tissue infiltrates in the left lower lobe lung, bilateral mediastinal adenopathy,widespread metastatic disease, including to thoracic, low cervical nodes, liver and bones.   04/10/2015 - 04/10/2015 Radiation Therapy SRS to the brain met    04/27/2015 -  Chemotherapy Crizotinib 262m bid      HISTORY OF PRESENTING ILLNESS:  Walter Wilkins 59y.o. male is here because of newly diagnosed metastatic lung cancer.  He has been in good health until 3 month ago, when he started having intermittent nausea, and gradual weight loss a total of 18 lbs since then, although some are intentional by cutting back diet. He denies any vomiting,  abdominal pain or discomfort, or change of his bowel habit.  He presented with syncope, right after he noticed some titch of left face, and rolling over of his eyes. He will FJosefa Wilkins the ground and lost consciousness. This was witnessed by his wife and other people, 911 was called and paramedic staff underwent CPR for a total of 30 minutes. The cardiac monitor shows that he was in asystole. [facility] He workup in the ambulance, and did not have any significant in neurology deficit afterwards. He was seen by cardiology in the neurology service, cardiac catheterization was negative for coronary artery disease. He underwent a CT of head which showed a 1.9 cm lesion in the left frontal lobe with surrounding edema. CT of the chest reviewed no PE, but significant infiltrative consolidation in the left lower lobe, it was felt to be aspiration pneumonia. CT of the abdomen reviewed multiple diffuse liver metastasis. He underwent a liver biopsy which showed adenocarcinoma.   He was a discharge to home. He has been doing well overall since hospital discharge. He has some residual pain from the rib fracture from CPR. He denies any cough, dyspnea, or other symptoms. He has mild fatigue and low appetite, which has been chronic since his gastric bypass surgery in 2011.   CURRENT THERAPY: Crizotinib 250 mg twice daily, started on 04/27/2015  INTERIM HISTORY: JLeonidesreturns for follow-up and xgeva injection. He is accompanied by his brother and his wife. He is doing moderately well, no significant change since last visit. His  main complaint is moderate fatigue, and depression. He is able to tolerate routine activity, mild dyspnea on moderate to heavy exertion. According to his wife and brother, he is quite depressed and cries quite often. Patient acknowledges that, denies suicidal ideas. He has weaned off on Zoloft, and currently on mirtazapine. His wife has started cutting  appointment for him within psychologist at local. He otherwise there's no other new changes. He is tolerating crizotinib without Significant Side Effects. His appetite is moderate, has mild intermittent nausea, no significant pain. He has had intermittent mild diarrhea, which resolves spontaneously. He had several incidents of stool incontinence at night. He also reports intermittent difficulty initiating urination, no dysuria or hematuria. He has not seen a urologist in the past.  MEDICAL HISTORY:  Past Medical History  Diagnosis Date  . Diabetes mellitus without complication   . H/O gastric bypass     a. ~2011 at [facility].  . Acid reflux   . Seizures 03/23/15  . Brain mass 03/24/15    ct head - left frontal lobe mass consistent with a brain metastasis  . Cancer     mass on lung, brain, sinuses, liver and bones    SURGICAL HISTORY: Past Surgical History  Procedure Laterality Date  . Left heart catheterization with coronary angiogram N/A 03/23/2015    Procedure: LEFT HEART CATHETERIZATION WITH CORONARY ANGIOGRAM;  Surgeon: Burnell Blanks, MD;  Location: [facility] CATH LAB;  Service: Cardiovascular;  Laterality: N/A;  . Gastric bypass  2011  . Liver biopsy  03/27/15  . Appendectomy    . Cholecystectomy    . Right knee osroscopy Right     SOCIAL HISTORY: History   Social History  . Marital Status: Unknown    Spouse Name: N/A  . Number of Children: 2  . Years of Education: N/A   Occupational History  . Barrister's clerk    Social History Main Topics  . Smoking status: Uses cigar   . Smokeless tobacco: Not on file  . Alcohol Use: 0.0 oz/week    0 Standard drinks or equivalent per week     Comment: Occasionally socially - once a week or less  . Drug Use: No  . Sexual Activity: Not on file   Other Topics Concern  . Not on file   Social History Narrative    FAMILY HISTORY: Family History  Problem Relation Age of Onset  . Valvular heart disease Father     H/o pig  valve  . Cancer Father     skin cancer   . Sudden death Neg Hx     No family history of sudden cardiac death  . Breast cancer Mother   . Cancer Mother 17    breast cancer   . Cancer Brother 42    base of tongue   . Cancer Maternal Uncle     throat cancer   . Cancer Maternal Uncle 90    colon cancer, and prostate cancer   . Cancer Cousin     lung cancer     ALLERGIES:  has No Known Allergies.  MEDICATIONS:  Current Outpatient Prescriptions  Medication Sig Dispense Refill  . ALPRAZolam (XANAX) 0.25 MG tablet Take 1 tablet (0.25 mg total) by mouth 3 (three) times daily as needed for anxiety. Last Refill 08/22/2014 60 tablet 3  . atorvastatin (LIPITOR) 40 MG tablet Take 40 mg by mouth daily. Last Refill 08/22/2014    . blood glucose meter kit and supplies KIT Dispense based  on patient and insurance preference. Use up to four times daily as directed. (FOR ICD-9 250.00, 250.01). 1 each 0  . CALCIUM & MAGNESIUM CARBONATES PO Take 500 mg by mouth 2 (two) times daily.    . crizotinib (XALKORI) 250 MG capsule Take 1 capsule (250 mg total) by mouth 2 (two) times daily. 60 capsule 5  . enoxaparin (LOVENOX) 120 MG/0.8ML injection Inject 0.8 mLs (120 mg total) into the skin daily. (Patient taking differently: Inject 120 mg into the skin every 12 (twelve) hours. ) 60 Syringe 3  . Insulin Glargine (LANTUS SOLOSTAR) 100 UNIT/ML Solostar Pen Inject 5 Units into the skin daily at 10 pm. (Patient taking differently: Inject 5 Units into the skin daily at 10 pm. Pt states dose varies) 15 mL 0  . Insulin Pen Needle 32G X 4 MM MISC Use with insulin pen as directed to dispense insulin 100 each 1  . levETIRAcetam (KEPPRA) 500 MG tablet Take 1 tablet (500 mg total) by mouth 2 (two) times daily. 60 tablet 3  . metFORMIN (GLUCOPHAGE) 1000 MG tablet Take 1,000 mg by mouth 2 (two) times daily with a meal. Last refill 08/23/2014    . mirtazapine (REMERON) 30 MG tablet Take 1 tablet (30 mg total) by mouth at bedtime. 30  tablet 5  . omeprazole (PRILOSEC) 20 MG capsule Take 20 mg by mouth daily. Last Refill 08/22/2014    . ondansetron (ZOFRAN) 8 MG tablet Take 1 tablet (8 mg total) by mouth every 8 (eight) hours as needed for nausea or vomiting. 30 tablet 3  . traZODone (DESYREL) 50 MG tablet Take 50 mg by mouth at bedtime.    Marland Kitchen lisinopril (PRINIVIL,ZESTRIL) 5 MG tablet Take 5 mg by mouth daily. Last Refill 08/10/2014     No current facility-administered medications for this visit.    REVIEW OF SYSTEMS:   Constitutional: Denies fevers, chills or abnormal night sweats, (+) weight loss  Eyes: Denies blurriness of vision, double vision or watery eyes Ears, nose, mouth, throat, and face: Denies mucositis or sore throat Respiratory: Denies cough, dyspnea or wheezes Cardiovascular: Denies palpitation, chest discomfort or lower extremity swelling Gastrointestinal:  Mild intermittent nausea, no heartburn or change in bowel habits Skin: Denies abnormal skin rashes Lymphatics: Denies new lymphadenopathy or easy bruising Neurological:Denies numbness, tingling or new weaknesses Behavioral/Psych: Mood is stable, no new changes  All other systems were reviewed with the patient and are negative.  PHYSICAL EXAMINATION: ECOG PERFORMANCE STATUS: 1 - Symptomatic but completely ambulatory  Filed Vitals:   07/31/15 1243  BP: 117/73  Pulse: 65  Temp: 99.7 F (37.6 C)  Resp: 18   Filed Weights   07/31/15 1243  Weight: 252 lb 14.4 oz (114.715 kg)    GENERAL:alert, no distress and comfortable SKIN: skin color, texture, turgor are normal, no rashes or significant lesions EYES: normal, conjunctiva are pink and non-injected, sclera clear OROPHARYNX:no exudate, no erythema and lips, buccal mucosa, and tongue normal  NECK: supple, thyroid normal size, non-tender, without nodularity LYMPH:  no palpable lymphadenopathy in the cervical, axillary or inguinal LUNGS: clear to auscultation and percussion with normal breathing  effort HEART: regular rate & rhythm and no murmurs and no lower extremity edema ABDOMEN:abdomen soft, non-tender and normal bowel sounds Musculoskeletal:no cyanosis of digits and no clubbing  PSYCH: alert & oriented x 3 with fluent speech NEURO: no focal motor/sensory deficits  LABORATORY DATA:  I have reviewed the data as listed CBC Latest Ref Rng 07/31/2015 06/28/2015 06/05/2015  WBC  4.0 - 10.3 10e3/uL 5.2 6.5 8.5  Hemoglobin 13.0 - 17.1 g/dL 13.2 12.6(L) 13.8  Hematocrit 38.4 - 49.9 % 39.5 38.5 42.1  Platelets 140 - 400 10e3/uL 184 242 104(L)    CMP Latest Ref Rng 06/28/2015 06/05/2015 05/08/2015  Glucose 70 - 140 mg/dl 84 132 143(H)  BUN 7.0 - 26.0 mg/dL 11.0 15.3 18.0  Creatinine 0.7 - 1.3 mg/dL 0.8 0.8 0.8  Sodium 136 - 145 mEq/L 140 138 139  Potassium 3.5 - 5.1 mEq/L 4.2 3.8 3.9  Chloride 96 - 112 mmol/L - - -  CO2 22 - 29 mEq/L _0 Calcium 8.4 - 10.4 mg/dL 8.6 7.6(L) 8.3(L)  Total Protein 6.4 - 8.3 g/dL 5.4(L) 5.4(L) 5.9(L)  Total Bilirubin 0.20 - 1.20 mg/dL 0.46 0.44 0.48  Alkaline Phos 40 - 150 U/L 108 140 173(H)  AST 5 - 34 U/L _1 ALT 0 - 55 U/L _2 PATHOLOGY REPORT: Liver, needle/core biopsy, right lobe 03/27/2015  - METASTATIC ADENOCARCINOMA, SEE COMMENT. Microscopic Comment The adenocarcinoma demonstrates the following immunophenotype Cytokeratin 7 - strong diffuse expression. Cytokeratin 20 - negative expression. TTF-1 - patchy moderate strong expression. Napsin A - strong diffuse expression. CDX-2 - negative expression. PSA - negative expression. Overall, the morphology and immunophenotype are that of metastatic adenocarcinoma, primary to lung. There is tumor available for ancillary tumor testing. The case is reviewed with Dr. Lyndon Code who concurs. The case was discussed with Dr Burr Medico on 03/29/2015. (CRR:gt:ecj, 03/29/15)  RADIOGRAPHIC STUDIES: I have personally reviewed the radiological images as listed and agreed with the findings in the  report.  Ct Head Wo Contrast 03/24/2015    IMPRESSION: 1.9 cm high left frontal lobe mass with minimal surrounding edema, most consistent with a brain metastasis given findings on concurrent chest CT. Further evaluation with brain MRI (without and with contrast) is recommended.  These results were called by telephone at the time of interpretation on 03/24/2015 at 12:25 pm to [facility] , who verbally acknowledged these results.   Electronically Signed   By: Logan Bores   On: 03/24/2015 12:32   Ct Angio Chest Pe W/cm &/or Wo Cm 03/24/2015   IMPRESSION: 1. Multiple low-density lesions in the liver worrisome for metastatic disease. 2. No pulmonary emboli. 3. Consolidative infiltrate in the left lower lobe with interstitial infiltrate in the left upper lobe. I suspect both of these represent aspiration pneumonitis. 4. Single enlarged left hilar lymph node. 5. Multiple bilateral anterior rib fractures most likely secondary to recent CPR.   Electronically Signed   By: Lorriane Shire M.D.   On: 03/24/2015 11:51   Mr Jeri Cos OQ Contrast 03/25/2015    IMPRESSION: Solitary 17 x 15 mm hemorrhagic LEFT frontal lobe mass corresponding to CT abnormality with imaging characteristics of metastasis.  Otherwise unremarkable MRI of the brain with contrast for age.   Electronically Signed   By: Elon Alas   On: 03/25/2015 03:41   Ct Abdomen Pelvis W Contrast 03/25/2015    IMPRESSION: 1. Multiple low-density liver lesions highly suspicious for metastatic disease. Largest lesion lies in the right lobe measuring 3.4 cm in greatest transverse dimension. 2. No other evidence of metastatic disease below the diaphragm. No acute findings within the abdomen or pelvis. 3. Lung base findings that are unchanged from the previous day's CT angiogram. Consolidation/atelectasis in left lower lobe. Probable adenopathy in the posterior inferior left mediastinum. Prior PET-CT evaluated a small  left lower lobe nodule. This nodule was at the  left lung base. This nodule is not visualized currently--it may be obscured by the lung consolidation. Consider repeat follow-up PET-CT to evaluate the source of presumed metastatic disease to the liver.   Electronically Signed   By: Lajean Manes M.D.   On: 03/25/2015 12:30   PET 04/07/2015 IMPRESSION: 1. Widespread metastatic disease, including to thoracic/low cervical nodes, liver, and bones. 2. Favor primary bronchogenic carcinoma in the left lower lobe. 3. Incidental findings, as detailed above. 4. Suspicion of nodal metastasis about the deep right parotid gland versus a metachronous primary neoplasm. Of doubtful clinical significance.  CT chest, abdomen and pelvis with contrast on 06/28/2015  IMPRESSION: 1. Progressive osseous metastatic disease when compared to studies from April. 2. Left paraesophageal mass, left sub carinal nodes and left lower lobe lung lesion are smaller. 3. Persistent right-sided pulmonary embolism. 4. Patchy peripheral airspace disease in both lungs, right greater than left could be related to the pulmonary embolism or could be an inflammatory or infectious process. No definite metastatic pulmonary nodules. 5. Improved hepatic metastatic disease.   ASSESSMENT & PLAN:  59 year old Caucasian male with minimal smoking history, obesity status post gastric bypass surgery, diabetes, presented with cardiac arrest and presumed seizure. Imaging study showed left lower lobe infiltrative consultation, left hilar adenopathy, diffuse liver metastasis and aortic lobe brain metastasis.  1. 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 Images were reviewed in person. -The liver biopsy immunostain were consistent with lung primary. Although the left lower lobe infiltrative consultation could be aspiration pneumonia, and more likely this presents a  primary lung cancer. Patient did have a lung nodule in the left parietal lobe in the past, and he will bring his previous CT scan to me next visit. -I reviewed the PET scan findings with patient, unfortunately he has widespread metastatic disease. -s/p brain SRS. -I discussed the Foundation one genetic test results with patient and his wife. His tumor contains ALK-EML4 translocation, which can be targeted with ALK inhibitors. I recommend Crizotinib as his first-line treatment, which has showed superior response and him better tolerance than chemotherapy. The potential side effect of Crizotinib, which includes but not limited to, nausea, vomiting, diarrhea, skin rash, photosensitivity, vision disturbance, liver toxicity, etc, were discussed with patient, and he agrees to proceed -I reviewed his restaging CT scans. Both his lung, lymph nodes and liver metastasis has improved. CT scan also showed progressive osteo-metastasis, although I think the CT scan is not very reliable to evaluate bone metastasis, he has no clinical symptoms related to bone metastasis -His tolerating crizotinib well, we'll continue. Lab reviewed.  -Repeat PET scan in one month.   2. Brain mets -s/p SRS, follow-up with radiation oncology -His restaging brain MRI showed good response to treatment -Follow up with Dr. Isidore Moos, likely need a repeated brain MRI next months  3. Bone mets -Xgeva, continue every 4 weeks.   4. history of cardiac arrest -Likely related to his breathing metastasis and seizure -Cardiac cath was negative for coronary artery disease  5. Seizure, secondary to brain metastasis -Continue Keppra  -He will follow up with neurologist. He has not driven since the seizure.  6.  diabetes  -Continue follow-up with primary care physician.   7. Thrombocytopenia -Possibly related to his bone metastasis. -resolved.  8. DVT and Pulmonary embolism, diagnosed on 06/15/2015 -He was discharged home  from hospital  with Lovenox 120 mg daily, this is insufficient dose based on his body weight. Recent CT scan also showed persistent right pulmonary embolus -I recommend him to increase Lovenox to 120 mg every 12 hours. A new prescription was called in today. -Risk of bleeding was discussed with patient  9. Depression -He is still quite depressed. Denies suicidal ideas -continue mirtazapine 30 mg at bedtime, he was see a psychiatrist soon  Follow-up -PET in early Sep  -Xgeva today and next visit  -I will see him back after his PET scan    All questions were answered. The patient knows to call the clinic with any problems, questions or concerns. I spent 25 minutes counseling the patient face to face. The total time spent in the appointment was 30 minutes and more than 50% was on counseling.     [name], MD 07/31/2015   1:07 PM

## 2015-07-31 NOTE — Telephone Encounter (Signed)
Pt confirmed labs/ov/inj per 08/08 POF, gave pt avs and calendar... KJ, scheduled pt for PET to be done same day as MD..Marland KitchenMarland Kitchen

## 2015-08-09 ENCOUNTER — Telehealth: Payer: Self-pay | Admitting: *Deleted

## 2015-08-09 NOTE — Telephone Encounter (Signed)
Received vm call from Sharon/wife stating that pt had a seizure yest eve & was sent by ambulance to [facility] & is in ICU.  The CT showed a small bleed in the head around the tumor.  They are holding the lovenox for now & the neurologist or intensivist-Dr Daiva Nakayama will probably call to discuss what to do about the lovenox or Dr Burr Medico can call them.  Message to Dr Burr Medico.

## 2015-08-11 ENCOUNTER — Telehealth: Payer: Self-pay | Admitting: *Deleted

## 2015-08-11 ENCOUNTER — Telehealth: Payer: Self-pay | Admitting: Neurology

## 2015-08-11 ENCOUNTER — Telehealth: Payer: Self-pay | Admitting: Hematology

## 2015-08-11 ENCOUNTER — Other Ambulatory Visit: Payer: Self-pay | Admitting: *Deleted

## 2015-08-11 NOTE — Telephone Encounter (Signed)
Pt's wife called and states that pt was admitted to [facility] for seizures, the hospital dr is recommending to place a filter to help prevent blood clots moving to his lungs. Wife would like to know if this should be done. Appt is set up for today this afternoon. Please call advise as soon as possible. [phone] Booty (wife)

## 2015-08-11 NOTE — Telephone Encounter (Signed)
Left message on patient's home answering machine to inquire whether he received the research study information that was mailed to him last week. Asked patient to call me back at [phone]. Cindy S. Brigitte Pulse BSN, RN, CCRP 08/11/2015 1:16 PM

## 2015-08-11 NOTE — Telephone Encounter (Signed)
I called the patient. I spoke to him and his wife. Per Dr. Jannifer Franklin, it is okay for him to have the filter placed. They were happy to hear that.

## 2015-08-11 NOTE — Telephone Encounter (Signed)
Lft msg for pt and pt's wife Ivin Booty per 08/19 POF, MD visit added, mailed schedule to pt.Marland Kitchen KJ

## 2015-08-15 ENCOUNTER — Telehealth: Payer: Self-pay | Admitting: *Deleted

## 2015-08-15 NOTE — Telephone Encounter (Signed)
08/15/2015 Spoke with patient's wife, Walter Wilkins, by phone today who states that Walter Wilkins has read the consent form and would like to participate in the study, to help out in any way he can. Wife notes that patient will have a follow-up appointment with Dr. Burr Medico next week. Plans made to meet with patient at that appointment, to review and sign the consent form, if he decides to participate. At that point, we will be prepared to collect his research blood sample at his subsequent lab on 09/01/2015, or his next scheduled lab visit. Cindy S. Brigitte Pulse BSN, RN, CCRP 08/15/2015 4:11 PM

## 2015-08-22 ENCOUNTER — Encounter: Payer: Self-pay | Admitting: Hematology

## 2015-08-22 ENCOUNTER — Ambulatory Visit (HOSPITAL_BASED_OUTPATIENT_CLINIC_OR_DEPARTMENT_OTHER): Payer: Commercial Managed Care - PPO | Admitting: Hematology

## 2015-08-22 VITALS — BP 127/73 | HR 67 | Temp 98.6°F | Resp 16 | Ht 72.0 in | Wt 253.4 lb

## 2015-08-22 DIAGNOSIS — C7951 Secondary malignant neoplasm of bone: Secondary | ICD-10-CM | POA: Diagnosis not present

## 2015-08-22 DIAGNOSIS — F329 Major depressive disorder, single episode, unspecified: Secondary | ICD-10-CM

## 2015-08-22 DIAGNOSIS — C3492 Malignant neoplasm of unspecified part of left bronchus or lung: Secondary | ICD-10-CM

## 2015-08-22 DIAGNOSIS — C7931 Secondary malignant neoplasm of brain: Secondary | ICD-10-CM

## 2015-08-22 DIAGNOSIS — Z86718 Personal history of other venous thrombosis and embolism: Secondary | ICD-10-CM

## 2015-08-22 DIAGNOSIS — C787 Secondary malignant neoplasm of liver and intrahepatic bile duct: Secondary | ICD-10-CM | POA: Diagnosis not present

## 2015-08-22 DIAGNOSIS — C3432 Malignant neoplasm of lower lobe, left bronchus or lung: Secondary | ICD-10-CM

## 2015-08-22 MED ORDER — MIRTAZAPINE 45 MG PO TABS: 45.0000 mg | ORAL_TABLET | Freq: Every day | ORAL | Status: DC

## 2015-08-22 NOTE — Progress Notes (Signed)
[place]  Telephone:([phone]) [phone] Fax:([phone]) [place] Note   Patient Care Team: [name]. [name], MD as PCP - General (Internal Medicine) [name], MD as Consulting Physician (Hematology) 08/22/2015   CHIEF COMPLAINTS:  Follow up metastatic lung cancer  Oncology History   Metastatic lung cancer (metastasis from lung to other site)   Staging form: Lung, AJCC 7th Edition     Clinical: Stage IV (T3, N3, M1b) - Unsigned        Metastatic lung cancer (metastasis from lung to other site)   03/24/2015 Imaging brian MRI 1.9 cm high left frontal lobe mass with minimal surrounding edema   03/24/2015 Imaging CT showed Multiple low-density lesions in the liver worrisome for metastatic disease. Consolidative infiltrate in the left lower lobe with interstitial infiltrate in the left upper lobe. Single enlarged left hilar lymph node.   03/27/2015 Initial Diagnosis Metastatic lung cancer (metastasis from lung to other site)   03/27/2015 Pathology Results Metastatic adenocarcinoma, IHC positive for CK 7, TTF-1 and Napsin A. Negative for CK 20, CD X2, PSA, consistent with lung primary   03/27/2015 Miscellaneous Foundation One test showed (+) EML4-ALK fusion (variant 2), CDKN2A/B loss, SMAD4 R361H.    04/07/2015 Imaging PET scan showed hypermetabolic soft tissue infiltrates in the left lower lobe lung, bilateral mediastinal adenopathy,widespread metastatic disease, including to thoracic, low cervical nodes, liver and bones.   04/10/2015 - 04/10/2015 Radiation Therapy SRS to the brain met    04/27/2015 -  Chemotherapy Crizotinib 286m bid    08/08/2015 - 08/12/2015 [facility]  [facility] and [facility] showed small hemorrhage. Lovenox was held,  and he had the IVC. Placed. he was divided by neurologist and added the impact to Her for his seizure.     HISTORY OF PRESENTING ILLNESS:  JTrevian Hayashida 59y.o. male is here  because of newly diagnosed metastatic lung cancer.  He has been in good health until 3 month ago, when he started having intermittent nausea, and gradual weight loss a total of 18 lbs since then, although some are intentional by cutting back diet. He denies any vomiting, abdominal pain or discomfort, or change of his bowel habit.  He presented with syncope, right after he noticed some titch of left face, and rolling over of his eyes. He will FJosefa Halfon the ground and lost consciousness. This was witnessed by his wife and other people, 911 was called and paramedic staff underwent CPR for a total of 30 minutes. The cardiac monitor shows that he was in asystole. [facility] He workup in the ambulance, and did not have any significant in neurology deficit afterwards. He was seen by cardiology in the neurology service, cardiac catheterization was negative for coronary artery disease. He underwent a CT of head which showed a 1.9 cm lesion in the left frontal lobe with surrounding edema. CT of the chest reviewed no PE, but significant infiltrative consolidation in the left lower lobe, it was felt to be aspiration pneumonia. CT of the abdomen reviewed multiple diffuse liver metastasis. He underwent a liver biopsy which showed adenocarcinoma.   He was a discharge to home. He has been doing well overall since hospital discharge. He has some residual pain from the rib fracture from CPR. He denies any cough, dyspnea, or other symptoms. He has mild fatigue and low appetite, which has been chronic since  his gastric bypass surgery in 2011.   CURRENT THERAPY: Crizotinib 250 mg twice daily, started on 04/27/2015  INTERIM HISTORY: Christia returns for follow-up after his recent hospitalization.  He had a partial seizure on 08/08/2015 at home, and was sent to [facility]. CT  Had reviewed a  1.6 cm mass in the left frontal lobe with rim hyperdensity,  Concerning for  hemorrhage. No hematoma. No other new lesions. So his Lovenox was held, and he had IVC filter placed. He was a valid by urologist,  His seizure medication was adjusted. He was discharged home on August 20, had another episode of small seizure 3 days ago, and was seen at the emergency room.  He complains of moderate fatigue,  Not able to do much activity, low appetite, able to eat small meals, no recent weight loss.  He denies pain, nausea, or other new symptoms.   MEDICAL HISTORY:  Past Medical History  Diagnosis Date  . Diabetes mellitus without complication   . H/O gastric bypass     a. ~[facility] at [facility].  . Acid reflux   . Seizures 03/23/15  . Brain mass 03/24/15    ct head - left frontal lobe mass consistent with a brain metastasis  . Cancer     mass on lung, brain, sinuses, liver and bones    SURGICAL HISTORY: Past Surgical History  Procedure Laterality Date  . Left heart catheterization with coronary angiogram N/A 03/23/2015    Procedure: LEFT HEART CATHETERIZATION WITH CORONARY ANGIOGRAM;  Surgeon: Burnell Blanks, MD;  Location: [facility] CATH LAB;  Service: Cardiovascular;  Laterality: N/A;  . Gastric bypass  2011  . Liver biopsy  03/27/15  . Appendectomy    . Cholecystectomy    . Right knee osroscopy Right     SOCIAL HISTORY: History   Social History  . Marital Status: Unknown    Spouse Name: N/A  . Number of Children: 2  . Years of Education: N/A   Occupational History  . Barrister's clerk    Social History Main Topics  . Smoking status: Uses cigar   . Smokeless tobacco: Not on file  . Alcohol Use: 0.0 oz/week    0 Standard drinks or equivalent per week     Comment: Occasionally socially - once a week or less  . Drug Use: No  . Sexual Activity: Not on file   Other Topics Concern  . Not on file   Social History Narrative    FAMILY HISTORY: Family History  Problem Relation Age of Onset  . Valvular heart disease Father     H/o pig valve  . Cancer  Father     skin cancer   . Sudden death Neg Hx     No family history of sudden cardiac death  . Breast cancer Mother   . Cancer Mother 34    breast cancer   . Cancer Brother 42    base of tongue   . Cancer Maternal Uncle     throat cancer   . Cancer Maternal Uncle 90    colon cancer, and prostate cancer   . Cancer Cousin     lung cancer     ALLERGIES:  has No Known Allergies.  MEDICATIONS:  Current Outpatient Prescriptions  Medication Sig Dispense Refill  . ALPRAZolam (XANAX) 0.5 MG tablet Take 0.5 mg by mouth.    Marland Kitchen atorvastatin (LIPITOR) 40 MG tablet Take 40 mg by mouth daily. Last Refill 08/22/2014    .  blood glucose meter kit and supplies KIT Dispense based on patient and insurance preference. Use up to four times daily as directed. (FOR ICD-9 250.00, 250.01). 1 each 0  . CALCIUM & MAGNESIUM CARBONATES PO Take 500 mg by mouth 2 (two) times daily.    . crizotinib (XALKORI) 250 MG capsule Take 1 capsule (250 mg total) by mouth 2 (two) times daily. 60 capsule 5  . lacosamide (VIMPAT) 50 MG TABS tablet Take 50 mg by mouth 2 (two) times daily.    Marland Kitchen levETIRAcetam (KEPPRA) 500 MG tablet Take 1 tablet (500 mg total) by mouth 2 (two) times daily. 60 tablet 3  . lisinopril (PRINIVIL,ZESTRIL) 5 MG tablet Take 5 mg by mouth daily. Last Refill 08/10/2014    . metFORMIN (GLUCOPHAGE) 1000 MG tablet Take 1,000 mg by mouth 2 (two) times daily with a meal. Last refill 08/23/2014    . Multiple Vitamin (MULTI-VITAMINS) TABS Take by mouth.    Marland Kitchen omeprazole (PRILOSEC) 20 MG capsule Take 20 mg by mouth daily. Last Refill 08/22/2014    . ondansetron (ZOFRAN) 8 MG tablet Take 1 tablet (8 mg total) by mouth every 8 (eight) hours as needed for nausea or vomiting. 30 tablet 3  . traZODone (DESYREL) 50 MG tablet Take 50 mg by mouth at bedtime.    . mirtazapine (REMERON) 45 MG tablet Take 1 tablet (45 mg total) by mouth at bedtime. 30 tablet 2   No current facility-administered medications for this visit.     REVIEW OF SYSTEMS:   Constitutional: Denies fevers, chills or abnormal night sweats, (+) weight loss  Eyes: Denies blurriness of vision, double vision or watery eyes Ears, nose, mouth, throat, and face: Denies mucositis or sore throat Respiratory: Denies cough, dyspnea or wheezes Cardiovascular: Denies palpitation, chest discomfort or lower extremity swelling Gastrointestinal:  Mild intermittent nausea, no heartburn or change in bowel habits Skin: Denies abnormal skin rashes Lymphatics: Denies new lymphadenopathy or easy bruising Neurological:Denies numbness, tingling or new weaknesses Behavioral/Psych: Mood is stable, no new changes  All other systems were reviewed with the patient and are negative.  PHYSICAL EXAMINATION: ECOG PERFORMANCE STATUS: 2  Filed Vitals:   08/22/15 1445  BP: 127/73  Pulse: 67  Temp: 98.6 F (37 C)  Resp: 16   Filed Weights   08/22/15 1445  Weight: 253 lb 6.4 oz (114.941 kg)    GENERAL:alert, no distress and comfortable SKIN: skin color, texture, turgor are normal, no rashes or significant lesions EYES: normal, conjunctiva are pink and non-injected, sclera clear OROPHARYNX:no exudate, no erythema and lips, buccal mucosa, and tongue normal  NECK: supple, thyroid normal size, non-tender, without nodularity LYMPH:  no palpable lymphadenopathy in the cervical, axillary or inguinal LUNGS: clear to auscultation and percussion with normal breathing effort HEART: regular rate & rhythm and no murmurs and no lower extremity edema ABDOMEN:abdomen soft, non-tender and normal bowel sounds Musculoskeletal:no cyanosis of digits and no clubbing  PSYCH: alert & oriented x 3 with fluent speech NEURO: no focal motor/sensory deficits  LABORATORY DATA:  I have reviewed the data as listed CBC Latest Ref Rng 07/31/2015 06/28/2015 06/05/2015  WBC 4.0 - 10.3 10e3/uL 5.2 6.5 8.5  Hemoglobin 13.0 - 17.1 g/dL 13.2 12.6(L) 13.8  Hematocrit 38.4 - 49.9 % 39.5 38.5 42.1   Platelets 140 - 400 10e3/uL 184 242 104(L)    CMP Latest Ref Rng 07/31/2015 06/28/2015 06/05/2015  Glucose 70 - 140 mg/dl 119 84 132  BUN 7.0 - 26.0 mg/dL 9.8 11.0 15.3  Creatinine 0.7 - 1.3 mg/dL 0.8 0.8 0.8  Sodium 136 - 145 mEq/L 142 140 138  Potassium 3.5 - 5.1 mEq/L 4.1 4.2 3.8  Chloride 96 - 112 mmol/L - - -  CO2 22 - 29 mEq/L _0 Calcium 8.4 - 10.4 mg/dL 7.3(L) 8.6 7.6(L)  Total Protein 6.4 - 8.3 g/dL 5.3(L) 5.4(L) 5.4(L)  Total Bilirubin 0.20 - 1.20 mg/dL 0.42 0.46 0.44  Alkaline Phos 40 - 150 U/L 96 108 140  AST 5 - 34 U/L _1 ALT 0 - 55 U/L _2 PATHOLOGY REPORT: Liver, needle/core biopsy, right lobe 03/27/2015  - METASTATIC ADENOCARCINOMA, SEE COMMENT. Microscopic Comment The adenocarcinoma demonstrates the following immunophenotype Cytokeratin 7 - strong diffuse expression. Cytokeratin 20 - negative expression. TTF-1 - patchy moderate strong expression. Napsin A - strong diffuse expression. CDX-2 - negative expression. PSA - negative expression. Overall, the morphology and immunophenotype are that of metastatic adenocarcinoma, primary to lung. There is tumor available for ancillary tumor testing. The case is reviewed with Dr. Lyndon Code who concurs. The case was discussed with Dr Burr Medico on 03/29/2015. (CRR:gt:ecj, 03/29/15)  RADIOGRAPHIC STUDIES: I have personally reviewed the radiological images as listed and agreed with the findings in the report.  Ct Head Wo Contrast 03/24/2015    IMPRESSION: 1.9 cm high left frontal lobe mass with minimal surrounding edema, most consistent with a brain metastasis given findings on concurrent chest CT. Further evaluation with brain MRI (without and with contrast) is recommended.  These results were called by telephone at the time of interpretation on 03/24/2015 at 12:25 pm to PA [facility] , who verbally acknowledged these results.   Electronically Signed   By: Logan Bores   On: 03/24/2015 12:32   Ct Angio Chest Pe W/cm &/or  Wo Cm 03/24/2015   IMPRESSION: 1. Multiple low-density lesions in the liver worrisome for metastatic disease. 2. No pulmonary emboli. 3. Consolidative infiltrate in the left lower lobe with interstitial infiltrate in the left upper lobe. I suspect both of these represent aspiration pneumonitis. 4. Single enlarged left hilar lymph node. 5. Multiple bilateral anterior rib fractures most likely secondary to recent CPR.   Electronically Signed   By: Lorriane Shire M.D.   On: 03/24/2015 11:51   Mr Jeri Cos QQ Contrast 03/25/2015    IMPRESSION: Solitary 17 x 15 mm hemorrhagic LEFT frontal lobe mass corresponding to CT abnormality with imaging characteristics of metastasis.  Otherwise unremarkable MRI of the brain with contrast for age.   Electronically Signed   By: Elon Alas   On: 03/25/2015 03:41   Ct Abdomen Pelvis W Contrast 03/25/2015    IMPRESSION: 1. Multiple low-density liver lesions highly suspicious for metastatic disease. Largest lesion lies in the right lobe measuring 3.4 cm in greatest transverse dimension. 2. No other evidence of metastatic disease below the diaphragm. No acute findings within the abdomen or pelvis. 3. Lung base findings that are unchanged from the previous day's CT angiogram. Consolidation/atelectasis in left lower lobe. Probable adenopathy in the posterior inferior left mediastinum. Prior PET-CT evaluated a small left lower lobe nodule. This nodule was at the left lung base. This nodule is not visualized currently--it may be obscured by the lung consolidation. Consider repeat follow-up PET-CT to evaluate the source of presumed metastatic disease to the liver.   Electronically Signed   By: Lajean Manes M.D.   On: 03/25/2015 12:30   PET 04/07/2015 IMPRESSION: 1. Widespread  metastatic disease, including to thoracic/low cervical nodes, liver, and bones. 2. Favor primary bronchogenic carcinoma in the left lower lobe. 3. Incidental findings, as detailed above. 4. Suspicion of  nodal metastasis about the deep right parotid gland versus a metachronous primary neoplasm. Of doubtful clinical significance.  CT chest, abdomen and pelvis with contrast on 06/28/2015  IMPRESSION: 1. Progressive osseous metastatic disease when compared to studies from April. 2. Left paraesophageal mass, left sub carinal nodes and left lower lobe lung lesion are smaller. 3. Persistent right-sided pulmonary embolism. 4. Patchy peripheral airspace disease in both lungs, right greater than left could be related to the pulmonary embolism or could be an inflammatory or infectious process. No definite metastatic pulmonary nodules. 5. Improved hepatic metastatic disease.   ASSESSMENT & PLAN:  59 year old Caucasian male with minimal smoking history, obesity status post gastric bypass surgery, diabetes, presented with cardiac arrest and presumed seizure. Imaging study showed left lower lobe infiltrative consultation, left hilar adenopathy, diffuse liver metastasis and aortic lobe brain metastasis.  1. 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 Images were reviewed in person. -The liver biopsy immunostain were consistent with lung primary. Although the left lower lobe infiltrative consultation could be aspiration pneumonia, and more likely this presents a primary lung cancer. Patient did have a lung nodule in the left parietal lobe in the past, and he will bring his previous CT scan to me next visit. -I reviewed the PET scan findings with patient, unfortunately he has widespread metastatic disease. -s/p brain SRS. -I discussed the Foundation one genetic test results with patient and his wife. His tumor contains ALK-EML4 translocation, which can be targeted with ALK inhibitors. I recommend Crizotinib as his first-line treatment, which has showed superior response and him better  tolerance than chemotherapy. The potential side effect of Crizotinib, which includes but not limited to, nausea, vomiting, diarrhea, skin rash, photosensitivity, vision disturbance, liver toxicity, etc, were discussed with patient, and he agrees to proceed -I reviewed his restaging CT scans. Both his lung, lymph nodes and liver metastasis has improved. CT scan also showed progressive osteo-metastasis, although I think the CT scan is not very reliable to evaluate bone metastasis, he has no clinical symptoms related to bone metastasis -His tolerating crizotinib well, we'll continue. Lab reviewed.  -Repeat PET scan in one month.   2. Brain mets -s/p SRS, follow-up with radiation oncology -His restaging brain MRI showed good response to treatment -Follow up with Dr. Isidore Moos, likely need a repeated brain MRI next months   3. Bone mets -Xgeva, continue every 4 weeks.   4. history of cardiac arrest -Likely related to his breathing metastasis and seizure -Cardiac cath was negative for coronary artery disease  5. Seizure, secondary to brain metastasis -Continue Keppra  -He will follow up with neurologist. He has not driven since the seizure.  6.  diabetes  -Continue follow-up with primary care physician.   7. Thrombocytopenia -Possibly related to his bone metastasis. -resolved.  8. DVT and Pulmonary embolism, diagnosed on 06/15/2015 -He was discharged home from hospital with Lovenox 120 mg daily, this is insufficient dose based on his body weight. Recent CT scan also showed persistent right pulmonary embolus -I recommend him to increase Lovenox to 120 mg every 12 hours. A new prescription was called in today. -Risk of bleeding was discussed with patient - due to questionable hemorrhage around his brain metastases, his Lovenox  was held. I'll discuss with Dr. Camelia Eng to reevaluate his brain by CT or MRI in 1-2 month, if  Hemorrhage resolves, we may able to start once daily Lovenox.  9.  Depression -He is still quite depressed. Denies suicidal ideas -increase mirtazapine from 30 mg to 34m at bedtime.   Follow-up - continue holding Lovenox for now - he is scheduled for PET scan next Friday, and I'll see him after the scan.   All questions were answered. The patient knows to call the clinic with any problems, questions or concerns. I spent 25 minutes counseling the patient face to face. The total time spent in the appointment was 30 minutes and more than 50% was on counseling.     FTruitt Merle MD 08/22/2015   3:39 PM

## 2015-08-23 ENCOUNTER — Encounter: Payer: Self-pay | Admitting: *Deleted

## 2015-08-23 ENCOUNTER — Telehealth: Payer: Self-pay | Admitting: *Deleted

## 2015-08-23 NOTE — Telephone Encounter (Signed)
Called pt regarding getting CD of MRI.  Pt reports that they p/u today & his brother will bring to [place].   CD of CT taken to radiology/[facility] to upload in system per Dr Burr Medico.

## 2015-08-23 NOTE — Progress Notes (Signed)
Received a call from this patient and wife wanting to let Dr. Isidore Moos know pt has had a two seizures over the last month.  He is currently being seen by Dr. Burr Medico and Dr. Vertell Limber for follow ups.  They just wanted to give Dr. Isidore Moos and update.  Dr. Isidore Moos notified.

## 2015-08-23 NOTE — Progress Notes (Signed)
Received a message pt had called.  Returned pts call and received no answer.  Left a voicemail with instructions to call nursing at [phone].

## 2015-08-24 ENCOUNTER — Other Ambulatory Visit: Payer: Self-pay | Admitting: Hematology

## 2015-08-24 ENCOUNTER — Inpatient Hospital Stay
Admission: RE | Admit: 2015-08-24 | Discharge: 2015-08-24 | Disposition: A | Discharge status: Home / Self Care | Payer: Self-pay | Source: Ambulatory Visit | Attending: Hematology | Admitting: Hematology

## 2015-08-24 DIAGNOSIS — C801 Malignant (primary) neoplasm, unspecified: Secondary | ICD-10-CM

## 2015-08-25 ENCOUNTER — Other Ambulatory Visit: Payer: Self-pay | Admitting: *Deleted

## 2015-08-25 ENCOUNTER — Inpatient Hospital Stay
Admission: RE | Admit: 2015-08-25 | Discharge: 2015-08-25 | Disposition: A | Discharge status: Home / Self Care | Payer: Self-pay | Source: Ambulatory Visit | Attending: Radiation Oncology | Admitting: Radiation Oncology

## 2015-08-25 ENCOUNTER — Other Ambulatory Visit: Payer: Self-pay | Admitting: Radiation Oncology

## 2015-08-25 DIAGNOSIS — G9389 Other specified disorders of brain: Secondary | ICD-10-CM

## 2015-08-25 DIAGNOSIS — C3492 Malignant neoplasm of unspecified part of left bronchus or lung: Secondary | ICD-10-CM

## 2015-08-29 ENCOUNTER — Ambulatory Visit: Payer: Commercial Managed Care - PPO

## 2015-09-01 ENCOUNTER — Ambulatory Visit (HOSPITAL_BASED_OUTPATIENT_CLINIC_OR_DEPARTMENT_OTHER): Payer: Commercial Managed Care - PPO | Admitting: Hematology

## 2015-09-01 ENCOUNTER — Ambulatory Visit (HOSPITAL_COMMUNITY)
Admission: RE | Admit: 2015-09-01 | Discharge: 2015-09-01 | Disposition: A | Discharge status: Home / Self Care | Payer: Commercial Managed Care - PPO | Source: Ambulatory Visit | Attending: Hematology | Admitting: Hematology

## 2015-09-01 ENCOUNTER — Encounter: Payer: Self-pay | Admitting: Hematology

## 2015-09-01 ENCOUNTER — Telehealth: Payer: Self-pay | Admitting: Hematology

## 2015-09-01 ENCOUNTER — Other Ambulatory Visit (HOSPITAL_BASED_OUTPATIENT_CLINIC_OR_DEPARTMENT_OTHER): Payer: Commercial Managed Care - PPO

## 2015-09-01 ENCOUNTER — Ambulatory Visit (HOSPITAL_BASED_OUTPATIENT_CLINIC_OR_DEPARTMENT_OTHER): Payer: Commercial Managed Care - PPO

## 2015-09-01 VITALS — BP 116/75 | HR 59 | Temp 98.2°F | Resp 18 | Ht 72.0 in | Wt 255.5 lb

## 2015-09-01 DIAGNOSIS — C787 Secondary malignant neoplasm of liver and intrahepatic bile duct: Secondary | ICD-10-CM | POA: Diagnosis not present

## 2015-09-01 DIAGNOSIS — C3432 Malignant neoplasm of lower lobe, left bronchus or lung: Secondary | ICD-10-CM | POA: Diagnosis not present

## 2015-09-01 DIAGNOSIS — C7951 Secondary malignant neoplasm of bone: Secondary | ICD-10-CM | POA: Insufficient documentation

## 2015-09-01 DIAGNOSIS — Z9221 Personal history of antineoplastic chemotherapy: Secondary | ICD-10-CM | POA: Diagnosis not present

## 2015-09-01 DIAGNOSIS — Z923 Personal history of irradiation: Secondary | ICD-10-CM | POA: Diagnosis not present

## 2015-09-01 DIAGNOSIS — K429 Umbilical hernia without obstruction or gangrene: Secondary | ICD-10-CM | POA: Diagnosis not present

## 2015-09-01 DIAGNOSIS — C3490 Malignant neoplasm of unspecified part of unspecified bronchus or lung: Secondary | ICD-10-CM

## 2015-09-01 DIAGNOSIS — C3492 Malignant neoplasm of unspecified part of left bronchus or lung: Secondary | ICD-10-CM

## 2015-09-01 DIAGNOSIS — K4090 Unilateral inguinal hernia, without obstruction or gangrene, not specified as recurrent: Secondary | ICD-10-CM | POA: Insufficient documentation

## 2015-09-01 DIAGNOSIS — Z9884 Bariatric surgery status: Secondary | ICD-10-CM | POA: Diagnosis not present

## 2015-09-01 DIAGNOSIS — R918 Other nonspecific abnormal finding of lung field: Secondary | ICD-10-CM | POA: Diagnosis not present

## 2015-09-01 LAB — COMPREHENSIVE METABOLIC PANEL (CC13)
ALT: 23 U/L (ref 0–55)
AST: 23 U/L (ref 5–34)
Albumin: 2.8 g/dL — ABNORMAL LOW (ref 3.5–5.0)
Alkaline Phosphatase: 110 U/L (ref 40–150)
Anion Gap: 8 mEq/L (ref 3–11)
BILIRUBIN TOTAL: 0.59 mg/dL (ref 0.20–1.20)
BUN: 18.6 mg/dL (ref 7.0–26.0)
CHLORIDE: 106 meq/L (ref 98–109)
CO2: 28 meq/L (ref 22–29)
CREATININE: 0.8 mg/dL (ref 0.7–1.3)
Calcium: 7.8 mg/dL — ABNORMAL LOW (ref 8.4–10.4)
EGFR: >90 mL/min/{1.73_m2} (ref >90)
GLUCOSE: 118 mg/dL (ref 70–140)
Potassium: 3.5 mEq/L (ref 3.5–5.1)
SODIUM: 141 meq/L (ref 136–145)
TOTAL PROTEIN: 5.4 g/dL — AB (ref 6.4–8.3)

## 2015-09-01 LAB — CBC & DIFF AND RETIC
BASO%: 0.6 % (ref 0.0–2.0)
Basophils Absolute: 0 10*3/uL (ref 0.0–0.1)
EOS%: 5.2 % (ref 0.0–7.0)
Eosinophils Absolute: 0.3 10*3/uL (ref 0.0–0.5)
HCT: 41.2 % (ref 38.4–49.9)
HGB: 13.4 g/dL (ref 13.0–17.1)
IMMATURE RETIC FRACT: 13.4 % — AB (ref 3.00–10.60)
LYMPH#: 1.4 10*3/uL (ref 0.9–3.3)
LYMPH%: 25.7 % (ref 14.0–49.0)
MCH: 29.8 pg (ref 27.2–33.4)
MCHC: 32.5 g/dL (ref 32.0–36.0)
MCV: 91.8 fL (ref 79.3–98.0)
MONO#: 0.4 10*3/uL (ref 0.1–0.9)
MONO%: 7.5 % (ref 0.0–14.0)
NEUT%: 61 % (ref 39.0–75.0)
NEUTROS ABS: 3.3 10*3/uL (ref 1.5–6.5)
Platelets: 210 10*3/uL (ref 140–400)
RBC: 4.49 10*6/uL (ref 4.20–5.82)
RDW: 13.8 % (ref 11.0–14.6)
RETIC %: 1.26 % (ref 0.80–1.80)
Retic Ct Abs: 56.57 10*3/uL (ref 34.80–93.90)
WBC: 5.4 10*3/uL (ref 4.0–10.3)

## 2015-09-01 LAB — RESEARCH LABS

## 2015-09-01 LAB — GLUCOSE, CAPILLARY: Glucose-Capillary: 94 mg/dL (ref 65–99)

## 2015-09-01 MED ORDER — DENOSUMAB 120 MG/1.7ML ~~LOC~~ SOLN
120.0000 mg | Freq: Once | SUBCUTANEOUS | Status: AC
  Administered 2015-09-01: 120 mg via SUBCUTANEOUS
  Filled 2015-09-01: qty 1.7

## 2015-09-01 MED ORDER — FLUDEOXYGLUCOSE F - 18 (FDG) INJECTION: 12.7000 | Freq: Once | INTRAVENOUS | Status: DC | PRN

## 2015-09-01 MED ORDER — ALECTINIB HCL 150 MG PO CAPS: 600.0000 mg | ORAL_CAPSULE | Freq: Two times a day (BID) | ORAL | Status: DC

## 2015-09-01 NOTE — Progress Notes (Signed)
Walter Wilkins  [phone] [phone]  Clinic Follow Up Note   Patient Care Team: Walter Wilkins. Walter Starch, MD as PCP - General (Internal Medicine) Walter Merle, MD as Consulting Physician (Hematology) 09/01/2015   CHIEF COMPLAINTS:  Follow up metastatic lung cancer  Oncology History   Metastatic lung cancer (metastasis from lung to other site)   Staging form: Lung, AJCC 7th Edition     Clinical: Stage IV (T3, N3, M1b) - Unsigned        Metastatic lung cancer (metastasis from lung to other site)   03/24/2015 Imaging brian MRI 1.9 cm high left frontal lobe mass with minimal surrounding edema   03/24/2015 Imaging CT showed Multiple low-density lesions in the liver worrisome for metastatic disease. Consolidative infiltrate in the left lower lobe with interstitial infiltrate in the left upper lobe. Single enlarged left hilar lymph node.   03/27/2015 Initial Diagnosis Metastatic lung cancer (metastasis from lung to other site)   03/27/2015 Pathology Results Metastatic adenocarcinoma, IHC positive for CK 7, TTF-1 and Napsin A. Negative for CK 20, CD X2, PSA, consistent with lung primary   03/27/2015 Miscellaneous Foundation One test showed (+) EML4-ALK fusion (variant 2), CDKN2A/B loss, SMAD4 R361H.    04/07/2015 Imaging PET scan showed hypermetabolic soft tissue infiltrates in the left lower lobe lung, bilateral mediastinal adenopathy,widespread metastatic disease, including to thoracic, low cervical nodes, liver and bones.   04/10/2015 - 04/10/2015 Radiation Therapy SRS to the brain met    04/27/2015 -  Chemotherapy Crizotinib 240m bid    08/08/2015 - 08/12/2015 [facility] Ad[facility]  [facility] and [facility] of brain showed small hemorrhage. Lovenox was held,  and he had the IVC. Placed. he was divided by neurologist and added the impact to Her for his seizure.     HISTORY OF PRESENTING ILLNESS:  Walter Wilkins 59y.o. male is here  because of newly diagnosed metastatic lung cancer.  He has been in Wilkins health until 3 month ago, when he started having intermittent nausea, and gradual weight loss a total of 18 lbs since then, although some are intentional by cutting back diet. He denies any vomiting, abdominal pain or discomfort, or change of his bowel habit.  He presented with syncope, right after he noticed some titch of left face, and rolling over of his eyes. He will FJosefa Wilkins the ground and lost consciousness. This was witnessed by his wife and other people, 911 was called and paramedic staff underwent CPR for a total of 30 minutes. The cardiac monitor shows that he was in asystole. [facility] He workup in the ambulance, and did not have any significant in neurology deficit afterwards. He was seen by cardiology in the neurology service, cardiac catheterization was negative for coronary artery disease. He underwent a CT of head which showed a 1.9 cm lesion in the left frontal lobe with surrounding edema. CT of the chest reviewed no PE, but significant infiltrative consolidation in the left lower lobe, it was felt to be aspiration pneumonia. CT of the abdomen reviewed multiple diffuse liver metastasis. He underwent a liver biopsy which showed adenocarcinoma.   He was a discharge to home. He has been doing well overall since hospital discharge. He has some residual pain from the rib fracture from CPR. He denies any cough, dyspnea, or other symptoms. He has mild fatigue and low appetite, which has been chronic since  his gastric bypass surgery in 2011.   CURRENT THERAPY: Crizotinib 250 mg twice daily, started on 04/27/2015  INTERIM HISTORY: Walter Wilkins returns for follow-up and discuss restaging PET. He and his wife has moved to [place], his wife is recovering well from a recent knee surgery. Walter Wilkins feels moderately well overall, no significant change since I saw him last time. He still has  moderate fatigue, able tolerate light daily routine activities, his depression is stable or slightly better, he denies significant pain, mild dyspnea on Exertion is stable, no cough, no GI symptoms. His appetite is moderate, weight is stable.   MEDICAL HISTORY:  Past Medical History  Diagnosis Date  . Diabetes mellitus without complication   . H/O gastric bypass     a. ~2011 at [facility].  . Acid reflux   . Seizures 03/23/15  . Brain mass 03/24/15    ct head - left frontal lobe mass consistent with a brain metastasis  . Cancer     mass on lung, brain, sinuses, liver and bones    SURGICAL HISTORY: Past Surgical History  Procedure Laterality Date  . Left heart catheterization with coronary angiogram N/A 03/23/2015    Procedure: LEFT HEART CATHETERIZATION WITH CORONARY ANGIOGRAM;  Surgeon: Walter Blanks, MD;  Location[facility] LAB;  Service: Cardiovascular;  Laterality: N/A;  . Gastric bypass  2011  . Liver biopsy  03/27/15  . Appendectomy    . Cholecystectomy    . Right knee osroscopy Right     SOCIAL HISTORY: History   Social History  . Marital Status: Unknown    Spouse Name: N/A  . Number of Children: 2  . Years of Education: N/A   Occupational History  . Barrister's clerk    Social History Main Topics  . Smoking status: Uses cigar   . Smokeless tobacco: Not on file  . Alcohol Use: 0.0 oz/week    0 Standard drinks or equivalent per week     Comment: Occasionally socially - once a week or less  . Drug Use: No  . Sexual Activity: Not on file   Other Topics Concern  . Not on file   Social History Narrative    FAMILY HISTORY: Family History  Problem Relation Age of Onset  . Valvular heart disease Father     H/o pig valve  . Cancer Father     skin cancer   . Sudden death Neg Hx     No family history of sudden cardiac death  . Breast cancer Mother   . Cancer Mother 28    breast cancer   . Cancer Brother 42    base of tongue   . Cancer Maternal  Uncle     throat cancer   . Cancer Maternal Uncle 90    colon cancer, and prostate cancer   . Cancer Cousin     lung cancer     ALLERGIES:  has No Known Allergies.  MEDICATIONS:  Current Outpatient Prescriptions  Medication Sig Dispense Refill  . ALPRAZolam (XANAX) 0.5 MG tablet Take 0.5 mg by mouth.    Marland Kitchen atorvastatin (LIPITOR) 40 MG tablet Take 40 mg by mouth daily. Last Refill 08/22/2014    . blood glucose meter kit and supplies KIT Dispense based on patient and insurance preference. Use up to four times daily as directed. (FOR ICD-9 250.00, 250.01). 1 each 0  . CALCIUM & MAGNESIUM CARBONATES PO Take 500 mg by mouth 2 (two) times daily.    Marland Kitchen  crizotinib (XALKORI) 250 MG capsule Take 1 capsule (250 mg total) by mouth 2 (two) times daily. 60 capsule 5  . lacosamide (VIMPAT) 50 MG TABS tablet Take 50 mg by mouth 2 (two) times daily.    Marland Kitchen levETIRAcetam (KEPPRA) 500 MG tablet Take 1 tablet (500 mg total) by mouth 2 (two) times daily. 60 tablet 3  . lisinopril (PRINIVIL,ZESTRIL) 5 MG tablet Take 5 mg by mouth daily. Last Refill 08/10/2014    . metFORMIN (GLUCOPHAGE) 1000 MG tablet Take 1,000 mg by mouth 2 (two) times daily with a meal. Last refill 08/23/2014    . mirtazapine (REMERON) 45 MG tablet Take 1 tablet (45 mg total) by mouth at bedtime. 30 tablet 2  . Multiple Vitamin (MULTI-VITAMINS) TABS Take by mouth.    Marland Kitchen omeprazole (PRILOSEC) 20 MG capsule Take 20 mg by mouth daily. Last Refill 08/22/2014    . ondansetron (ZOFRAN) 8 MG tablet Take 1 tablet (8 mg total) by mouth every 8 (eight) hours as needed for nausea or vomiting. 30 tablet 3  . traZODone (DESYREL) 50 MG tablet Take 50 mg by mouth at bedtime.     No current facility-administered medications for this visit.    REVIEW OF SYSTEMS:   Constitutional: Denies fevers, chills or abnormal night sweats, (+) weight loss  Eyes: Denies blurriness of vision, double vision or watery eyes Ears, nose, mouth, throat, and face: Denies mucositis  or sore throat Respiratory: Denies cough, dyspnea or wheezes Cardiovascular: Denies palpitation, chest discomfort or lower extremity swelling Gastrointestinal:  Mild intermittent nausea, no heartburn or change in bowel habits Skin: Denies abnormal skin rashes Lymphatics: Denies new lymphadenopathy or easy bruising Neurological:Denies numbness, tingling or new weaknesses Behavioral/Psych: Mood is stable, no new changes  All other systems were reviewed with the patient and are negative.  PHYSICAL EXAMINATION: ECOG PERFORMANCE STATUS: 2  Filed Vitals:   09/01/15 1501  BP: 116/75  Pulse: 59  Temp: 98.2 F (36.8 C)  Resp: 18   Filed Weights   09/01/15 1501  Weight: 255 lb 8 oz (115.894 kg)    GENERAL:alert, no distress and comfortable SKIN: skin color, texture, turgor are normal, no rashes or significant lesions EYES: normal, conjunctiva are pink and non-injected, sclera clear OROPHARYNX:no exudate, no erythema and lips, buccal mucosa, and tongue normal  NECK: supple, thyroid normal size, non-tender, without nodularity LYMPH:  no palpable lymphadenopathy in the cervical, axillary or inguinal LUNGS: clear to auscultation and percussion with normal breathing effort HEART: regular rate & rhythm and no murmurs and no lower extremity edema ABDOMEN:abdomen soft, non-tender and normal bowel sounds Musculoskeletal:no cyanosis of digits and no clubbing  PSYCH: alert & oriented x 3 with fluent speech NEURO: no focal motor/sensory deficits  LABORATORY DATA:  I have reviewed the data as listed CBC Latest Ref Rng 09/01/2015 07/31/2015 06/28/2015  WBC 4.0 - 10.3 10e3/uL 5.4 5.2 6.5  Hemoglobin 13.0 - 17.1 g/dL 13.4 13.2 12.6(L)  Hematocrit 38.4 - 49.9 % 41.2 39.5 38.5  Platelets 140 - 400 10e3/uL 210 184 242    CMP Latest Ref Rng 09/01/2015 07/31/2015 06/28/2015  Glucose 70 - 140 mg/dl 118 119 84  BUN 7.0 - 26.0 mg/dL 18.6 9.8 11.0  Creatinine 0.7 - 1.3 mg/dL 0.8 0.8 0.8  Sodium 136 - 145 mEq/L  141 142 140  Potassium 3.5 - 5.1 mEq/L 3.5 4.1 4.2  Chloride 96 - 112 mmol/L - - -  CO2 22 - 29 mEq/L 28 25 26   Calcium 8.4 -  10.4 mg/dL 7.8(L) 7.3(L) 8.6  Total Protein 6.4 - 8.3 g/dL 5.4(L) 5.3(L) 5.4(L)  Total Bilirubin 0.20 - 1.20 mg/dL 0.59 0.42 0.46  Alkaline Phos 40 - 150 U/L 110 96 108  AST 5 - 34 U/L 23 23 30   ALT 0 - 55 U/L 23 19 31       PATHOLOGY REPORT: Liver, needle/core biopsy, right lobe 03/27/2015  - METASTATIC ADENOCARCINOMA, SEE COMMENT. Microscopic Comment The adenocarcinoma demonstrates the following immunophenotype Cytokeratin 7 - strong diffuse expression. Cytokeratin 20 - negative expression. TTF-1 - patchy moderate strong expression. Napsin A - strong diffuse expression. CDX-2 - negative expression. PSA - negative expression. Overall, the morphology and immunophenotype are that of metastatic adenocarcinoma, primary to lung. There is tumor available for ancillary tumor testing. The case is reviewed with Dr. Lyndon Code who concurs. The case was discussed with Dr Burr Medico on 03/29/2015. (CRR:gt:ecj, 03/29/15)  RADIOGRAPHIC STUDIES: I have personally reviewed the radiological images as listed and agreed with the findings in the report.  Ct Abdomen Pelvis W Contrast 03/25/2015    IMPRESSION: 1. Multiple low-density liver lesions highly suspicious for metastatic disease. Largest lesion lies in the right lobe measuring 3.4 cm in greatest transverse dimension. 2. No other evidence of metastatic disease below the diaphragm. No acute findings within the abdomen or pelvis. 3. Lung base findings that are unchanged from the previous day's CT angiogram. Consolidation/atelectasis in left lower lobe. Probable adenopathy in the posterior inferior left mediastinum. Prior PET-CT evaluated a small left lower lobe nodule. This nodule was at the left lung base. This nodule is not visualized currently--it may be obscured by the lung consolidation. Consider repeat follow-up PET-CT to evaluate the  source of presumed metastatic disease to the liver.   Electronically Signed   By: Lajean Manes M.D.   On: 03/25/2015 12:30   PET 04/07/2015 IMPRESSION: 1. Widespread metastatic disease, including to thoracic/low cervical nodes, liver, and bones. 2. Favor primary bronchogenic carcinoma in the left lower lobe. 3. Incidental findings, as detailed above. 4. Suspicion of nodal metastasis about the deep right parotid gland versus a metachronous primary neoplasm. Of doubtful clinical significance.  CT chest, abdomen and pelvis with contrast on 06/28/2015  IMPRESSION: 1. Progressive osseous metastatic disease when compared to studies from April. 2. Left paraesophageal mass, left sub carinal nodes and left lower lobe lung lesion are smaller. 3. Persistent right-sided pulmonary embolism. 4. Patchy peripheral airspace disease in both lungs, right greater than left could be related to the pulmonary embolism or could be an inflammatory or infectious process. No definite metastatic pulmonary nodules. 5. Improved hepatic metastatic disease.  PET 09/01/2015 IMPRESSION: 1. Mixed interval metabolic response compared to 04/07/15 PET-CT. Resolved hypermetabolism in the neck nodes. Increased hypermetabolism in the primary left lower lobe malignant pulmonary nodule with progressive lymphangitic carcinomatosis in the left lung. Increased hypermetabolism in the left infrahilar node. Mixed metabolic response in the liver and sclerotic osseous metastases. 2. New hypermetabolic medial right lower lobe focus of ground-glass opacity, suspect inflammatory/infectious etiology.  ASSESSMENT & PLAN:  59 year old Caucasian male with minimal smoking history, obesity status post gastric bypass surgery, diabetes, presented with cardiac arrest and presumed seizure. Imaging study showed left lower lobe infiltrative consultation, left hilar adenopathy, diffuse liver metastasis and aortic lobe brain metastasis.  1.  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 Images were reviewed in person. -We discussed  that his cancer is incurable at this stage, but treatable. We now have a 3 ALK inhibitors available for this type of lung cancer, with very Wilkins response rate and significantly increase patient's survival. -I discussed his recent restaging PET scan findings. It showed a mixed response, I suspect he has resistant clone in his tumor now -I recommend him to change from crizotinib to Alectinib, which was approved for crizotinib resistant ALK positive non-small cell lung cancer. Potential side effects from Alectinib were discussed with patient, he agrees to proceed. -I told him to continue crizotinib and change to Alectinib when he receives it  2. Brain mets -s/p SRS, follow-up with radiation oncology -His restaging brain MRI showed Wilkins response to treatment -Follow up with Dr. Isidore Moos, he is scheduled for a repeated brain MRI next months   3. Bone mets -Xgeva, continue every 4 weeks.   4. history of cardiac arrest -Likely related to his breathing metastasis and seizure -Cardiac cath was negative for coronary artery disease  5. Seizure, secondary to brain metastasis -Continue Keppra  -He will follow up with neurologist. He has not driven since the seizure.  6.  diabetes  -Continue follow-up with primary care physician.   7. DVT and Pulmonary embolism, diagnosed on 06/15/2015 -He was discharged home from hospital with Lovenox 120 mg daily, this is insufficient dose based on his body weight. Recent CT scan also showed persistent right pulmonary embolus - due to questionable hemorrhage around his brain metastases, his Lovenox was held. He is scheduled to have a repeated brain MRI in 1 month, if  Hemorrhage resolves, we may able to start once daily  Lovenox.  9. Depression -He is still quite depressed. Denies suicidal ideas -continue mirtazapine 62m at bedtime.   Follow-up -change crizotinib to alectinib when he receives the later, prescription was sent to his specialty pharmacy today  - continue holding Lovenox for now -RTC on 10/14   All questions were answered. The patient knows to call the clinic with any problems, questions or concerns. I spent 25 minutes counseling the patient face to face. The total time spent in the appointment was 30 minutes and more than 50% was on counseling.     FTruitt Merle MD 09/01/2015   8:29 AM

## 2015-09-01 NOTE — Progress Notes (Signed)
Faxed script for Alectinib to [facility]. Phone    [phone]    ;     Fax      [phone].

## 2015-09-01 NOTE — Telephone Encounter (Signed)
per pof to sch pt appt-gave pt coyp of avs

## 2015-09-04 ENCOUNTER — Encounter: Payer: Self-pay | Admitting: Hematology

## 2015-09-04 ENCOUNTER — Other Ambulatory Visit: Payer: Self-pay | Admitting: Hematology

## 2015-09-04 ENCOUNTER — Telehealth: Payer: Self-pay | Admitting: *Deleted

## 2015-09-04 DIAGNOSIS — C3492 Malignant neoplasm of unspecified part of left bronchus or lung: Secondary | ICD-10-CM

## 2015-09-04 MED ORDER — ENOXAPARIN SODIUM 150 MG/ML ~~LOC~~ SOLN: 150.0000 mg | SUBCUTANEOUS | Status: DC

## 2015-09-04 MED ORDER — ALECTINIB HCL 150 MG PO CAPS: 600.0000 mg | ORAL_CAPSULE | Freq: Two times a day (BID) | ORAL | Status: DC

## 2015-09-04 NOTE — Telephone Encounter (Signed)
THIS NOTE SEND TO RAQUEL BROWNING IN MANAGED CARE FOR CLARIFICATION.

## 2015-09-04 NOTE — Telephone Encounter (Signed)
Received call from wife Ivin Booty wanting to ask Dr. Burr Medico re:  Would chemo and radiation be better treatment plan for pt ?  Ivin Booty stated she would like the more aggressive treatments  to be done for pt. [facility]  Phone    [phone].

## 2015-09-04 NOTE — Telephone Encounter (Signed)
I called his wife back, and answered her question regarding chemoradiation vs alectinib, she agree with proceeding with alectinib. I will send the prescription to [place] today ( was sent to [facility] pharmacy last week).  His case was presented in the CNS tumor board this morning, the consensus is to restart his Lovenox, we feel the benefit of anticoagulation outweighs the small risk of re-bleeding. I according Lovenox 150 mg every 24 hours to his pharmacy. I discussed her that with patient's wife, she voiced good understanding and agreed to resume Lovenox.  [name]  09/04/2015

## 2015-09-04 NOTE — Progress Notes (Signed)
I faxed [facility] alecensa prior auth req

## 2015-09-05 ENCOUNTER — Encounter: Payer: Self-pay | Admitting: Hematology

## 2015-09-05 NOTE — Progress Notes (Signed)
I faxed notes/foundation one report to appeals-- [facility]  [phone]. Alecensa was denied.

## 2015-09-06 ENCOUNTER — Encounter: Payer: Self-pay | Admitting: Hematology

## 2015-09-06 ENCOUNTER — Telehealth: Payer: Self-pay | Admitting: *Deleted

## 2015-09-06 NOTE — Telephone Encounter (Signed)
Received message from on call nurse after hours that wife called to inform Dr. Burr Medico that [facility] had accepted the appeal for Alectinib.

## 2015-09-06 NOTE — Progress Notes (Signed)
Per [facility] alecensa has been approved   [id number]  09/04/15-09/04/16. I will send to medical records and to [place]  [phone]

## 2015-09-06 NOTE — Telephone Encounter (Signed)
Spoke with Janace Hoard, pharmacist @ [name] and informed her of same info.  Angie stated pt will be on the list to be contacted by pharmacy for further instructions about Alectinib. [place]      Phone       [phone]     ;    Fax     [phone].

## 2015-09-15 ENCOUNTER — Ambulatory Visit (INDEPENDENT_AMBULATORY_CARE_PROVIDER_SITE_OTHER): Payer: Commercial Managed Care - PPO | Admitting: Neurology

## 2015-09-15 ENCOUNTER — Encounter: Payer: Self-pay | Admitting: Neurology

## 2015-09-15 VITALS — BP 111/72 | HR 66 | Ht 72.0 in | Wt 252.0 lb

## 2015-09-15 DIAGNOSIS — G939 Disorder of brain, unspecified: Secondary | ICD-10-CM

## 2015-09-15 DIAGNOSIS — G9389 Other specified disorders of brain: Secondary | ICD-10-CM

## 2015-09-15 DIAGNOSIS — R569 Unspecified convulsions: Secondary | ICD-10-CM | POA: Diagnosis not present

## 2015-09-15 MED ORDER — LEVETIRACETAM 1000 MG PO TABS: 1000.0000 mg | ORAL_TABLET | Freq: Two times a day (BID) | ORAL | Status: DC

## 2015-09-15 NOTE — Patient Instructions (Addendum)
We will start taking the Keppra taking 750 mg twice day for 2 weeks, then start taking 1000 mg twice daily. Look out for drowsiness or irritability.   Epilepsy Epilepsy is a disorder in which a person has repeated seizures over time. A seizure is a release of abnormal electrical activity in the brain. Seizures can cause a change in attention, behavior, or the ability to remain awake and alert (altered mental status). Seizures often involve uncontrollable shaking (convulsions).  Most people with epilepsy lead normal lives. However, people with epilepsy are at an increased risk of falls, accidents, and injuries. Therefore, it is important to begin treatment right away. CAUSES  Epilepsy has many possible causes. Anything that disturbs the normal pattern of brain cell activity can lead to seizures. This may include:   Head injury.  Birth trauma.  High fever as a child.  Stroke.  Bleeding into or around the brain.  Certain drugs.  Prolonged low oxygen, such as what occurs after CPR efforts.  Abnormal brain development.  Certain illnesses, such as meningitis, encephalitis (brain infection), malaria, and other infections.  An imbalance of nerve signaling chemicals (neurotransmitters).  SIGNS AND SYMPTOMS  The symptoms of a seizure can vary greatly from one person to another. Right before a seizure, you may have a warning (aura) that a seizure is about to occur. An aura may include the following symptoms:  Fear or anxiety.  Nausea.  Feeling like the room is spinning (vertigo).  Vision changes, such as seeing flashing lights or spots. Common symptoms during a seizure include:  Abnormal sensations, such as an abnormal smell or a bitter taste in the mouth.   Sudden, general body stiffness.   Convulsions that involve rhythmic jerking of the face, arm, or leg on one or both sides.   Sudden change in consciousness.   Appearing to be awake but not responding.   Appearing to  be asleep but cannot be awakened.   Grimacing, chewing, lip smacking, drooling, tongue biting, or loss of bowel or bladder control. After a seizure, you may feel sleepy for a while. DIAGNOSIS  Your health care provider will ask about your symptoms and take a medical history. Descriptions from any witnesses to your seizures will be very helpful in the diagnosis. A physical exam, including a detailed neurological exam, is necessary. Various tests may be done, such as:   An electroencephalogram (EEG). This is a painless test of your brain waves. In this test, a diagram is created of your brain waves. These diagrams can be interpreted by a specialist.  An MRI of the brain.   A CT scan of the brain.   A spinal tap (lumbar puncture, LP).  Blood tests to check for signs of infection or abnormal blood chemistry. TREATMENT  There is no cure for epilepsy, but it is generally treatable. Once epilepsy is diagnosed, it is important to begin treatment as soon as possible. For most people with epilepsy, seizures can be controlled with medicines. The following may also be used:  A pacemaker for the brain (vagus nerve stimulator) can be used for people with seizures that are not well controlled by medicine.  Surgery on the brain. For some people, epilepsy eventually goes away. HOME CARE INSTRUCTIONS   Follow your health care provider's recommendations on driving and safety in normal activities.  Get enough rest. Lack of sleep can cause seizures.  Only take over-the-counter or prescription medicines as directed by your health care provider. Take any prescribed medicine exactly  as directed.  Avoid any known triggers of your seizures.  Keep a seizure diary. Record what you recall about any seizure, especially any possible trigger.   Make sure the people you live and work with know that you are prone to seizures. They should receive instructions on how to help you. In general, a witness to a  seizure should:   Cushion your head and body.   Turn you on your side.   Avoid unnecessarily restraining you.   Not place anything inside your mouth.   Call for emergency medical help if there is any question about what has occurred.   Follow up with your health care provider as directed. You may need regular blood tests to monitor the levels of your medicine.  SEEK MEDICAL CARE IF:   You develop signs of infection or other illness. This might increase the risk of a seizure.   You seem to be having more frequent seizures.   Your seizure pattern is changing.  SEEK IMMEDIATE MEDICAL CARE IF:   You have a seizure that does not stop after a few moments.   You have a seizure that causes any difficulty in breathing.   You have a seizure that results in a very severe headache.   You have a seizure that leaves you with the inability to speak or use a part of your body.  Document Released: 12/09/2005 Document Revised: 09/29/2013 Document Reviewed: 07/21/2013 [facility] Patient Information 2015 [place], [place]. This information is not intended to replace advice given to you by your health care provider. Make sure you discuss any questions you have with your health care provider.

## 2015-09-15 NOTE — Progress Notes (Signed)
Reason for visit: Seizures  Walter Wilkins. is an 59 y.o. 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 The patient has a lesion in the left parasagittal area. He has been on Keppra taking 500 mg twice daily. He had a seizure event around 08/10/2015, a repeat MRI of the brain at that time showed some minimal hemorrhage into the area of the tumor. No significant edema was noted. The patient is off of steroid therapy. Apparently, he was placed on Vimpat, the Keppra dose was not altered. The patient has run out of Vimpat recently, he was on 50 mg twice daily. The patient indicates that he occasionally will have some numbness involving the left hand. He is sleeping well at night. He does note some mild balance issues, but no falls. He reports some mild issues with headache. He denies any visual field disturbances. He has had one episode of aura associated with some speech problems that occurred on 08/20/2015. He returns to this office for an evaluation.  Past Medical History  Diagnosis Date  . Diabetes mellitus without complication   . H/O gastric bypass     a. ~2011 at [facility].  . Acid reflux   . Seizures 03/23/15  . Brain mass 03/24/15    ct head - left frontal lobe mass consistent with a brain metastasis  . Cancer     mass on lung, brain, sinuses, liver and bones    Past Surgical History  Procedure Laterality Date  . Left heart catheterization with coronary angiogram N/A 03/23/2015    Procedure: LEFT HEART CATHETERIZATION WITH CORONARY ANGIOGRAM;  Surgeon: Burnell Blanks, MD;  Location: [facility] CATH LAB;  Service: Cardiovascular;  Laterality: N/A;  . Gastric bypass  2011  . Liver biopsy  03/27/15  . Appendectomy    . Cholecystectomy    . Right knee osroscopy Right     Family History  Problem Relation Age of Onset  . Valvular heart disease Father     H/o pig valve  . Cancer  Father     skin cancer   . Sudden death Neg Hx     No family history of sudden cardiac death  . Breast cancer Mother   . Cancer Mother 3    breast cancer   . Cancer Brother 42    base of tongue   . Cancer Maternal Uncle     throat cancer   . Cancer Maternal Uncle 90    colon cancer, and prostate cancer   . Cancer Cousin     lung cancer     Social history:  reports that he has never smoked. He has never used smokeless tobacco. He reports that he drinks alcohol. He reports that he does not use illicit drugs.   No Known Allergies  Medications:  Prior to Admission medications   Medication Sig Start Date End Date Taking? Authorizing Provider  Alectinib HCl 150 MG CAPS Take 600 mg by mouth 2 (two) times daily. 09/04/15  Yes [name], MD  ALPRAZolam Duanne Moron) 0.5 MG tablet Take 0.5 mg by mouth 3 (three) times daily as needed.    Yes Historical Provider, MD  atorvastatin (LIPITOR) 40 MG tablet Take 40 mg by mouth at bedtime. Last Refill 08/22/2014   Yes Historical Provider, MD  blood glucose meter kit and supplies KIT Dispense based on patient and insurance preference. Use  up to four times daily as directed. (FOR ICD-9 250.00, 250.01). 03/28/15  Yes Orson Eva, MD  CALCIUM & MAGNESIUM CARBONATES PO Take 500 mg by mouth 2 (two) times daily. 06/19/15 06/18/16 Yes Historical Provider, MD  crizotinib Hulda Humphrey) 250 MG capsule Take 1 capsule (250 mg total) by mouth 2 (two) times daily. 04/13/15  Yes [name], MD  enoxaparin (LOVENOX) 150 MG/ML injection Inject 1 mL (150 mg total) into the skin daily. 09/04/15  Yes [name], MD  metFORMIN (GLUCOPHAGE) 1000 MG tablet Take 1,000 mg by mouth 2 (two) times daily with a meal. Last refill 08/23/2014   Yes Historical Provider, MD  mirtazapine (REMERON) 45 MG tablet Take 1 tablet (45 mg total) by mouth at bedtime. 08/22/15  Yes [name], MD  Multiple Vitamin (MULTI-VITAMINS) TABS Take by mouth.   Yes Historical Provider, MD  omeprazole (PRILOSEC) 20 MG capsule Take 20  mg by mouth daily. Last Refill 08/22/2014   Yes Historical Provider, MD  ondansetron (ZOFRAN) 8 MG tablet Take 1 tablet (8 mg total) by mouth every 8 (eight) hours as needed for nausea or vomiting. 07/03/15  Yes [name], MD  traZODone (DESYREL) 50 MG tablet Take 50 mg by mouth at bedtime.   Yes Historical Provider, MD  levETIRAcetam (KEPPRA) 1000 MG tablet Take 1 tablet (1,000 mg total) by mouth 2 (two) times daily. 09/15/15   Kathrynn Ducking, MD    ROS:  Out of a complete 14 system review of symptoms, the patient complains only of the following symptoms, and all other reviewed systems are negative.  Decreased activity Leg swelling Cold intolerance Memory loss, numbness, seizures, speech difficulty Agitation, confusion, depression, anxiety Skin wounds, skin rash  Blood pressure 111/72, pulse 66, height 6' (1.829 m), weight 252 lb (114.306 kg).  Physical Exam  General: The patient is alert and cooperative at the time of the examination.  Skin: 1-2+ edema is noted below the knees bilaterally..   Neurologic Exam  Mental status: The patient is alert and oriented x 3 at the time of the examination. The patient has apparent normal recent and remote memory, with an apparently normal attention span and concentration ability.   Cranial nerves: Facial symmetry is present. Speech is normal, no aphasia or dysarthria is noted. Extraocular movements are full. Visual fields are full.  Motor: The patient has good strength in all 4 extremities.  Sensory examination: Soft touch sensation is symmetric on the face, arms, and legs.  Coordination: The patient has good finger-nose-finger and heel-to-shin bilaterally.  Gait and station: The patient has a normal gait. Tandem gait is normal. Romberg is negative. No drift is seen.  Reflexes: Deep tendon reflexes are symmetric.   Assessment/Plan:  1. Lung cancer, metastatic to brain  2. Seizures  The patient is on relatively low-dose Keppra, I  would manage the seizures by increasing the dosing of Keppra, attempting to stay with monotherapy rather than adding a second anti-epileptic medication. The Keppra will be increased to 750 mg twice daily for 2 weeks, then the patient will be placed on 1000 g twice daily. The patient has run out of the Vimpat, I will not restart this. He does not operate a motor vehicle. He will follow-up in 4 months.  Jill Alexanders MD 09/15/2015 7:10 PM  [facility] [address] [place], [place] [address]  Phone [phone] Fax [phone]

## 2015-09-25 ENCOUNTER — Ambulatory Visit: Payer: Self-pay

## 2015-10-02 ENCOUNTER — Ambulatory Visit: Payer: Self-pay

## 2015-10-06 ENCOUNTER — Ambulatory Visit (HOSPITAL_BASED_OUTPATIENT_CLINIC_OR_DEPARTMENT_OTHER): Payer: Commercial Managed Care - PPO | Admitting: Hematology

## 2015-10-06 ENCOUNTER — Telehealth: Payer: Self-pay | Admitting: Hematology

## 2015-10-06 ENCOUNTER — Other Ambulatory Visit (HOSPITAL_BASED_OUTPATIENT_CLINIC_OR_DEPARTMENT_OTHER): Payer: Commercial Managed Care - PPO

## 2015-10-06 ENCOUNTER — Encounter: Payer: Self-pay | Admitting: Hematology

## 2015-10-06 ENCOUNTER — Ambulatory Visit
Admission: RE | Admit: 2015-10-06 | Discharge: 2015-10-06 | Disposition: A | Discharge status: Home / Self Care | Payer: Commercial Managed Care - PPO | Source: Ambulatory Visit | Attending: Radiation Oncology | Admitting: Radiation Oncology

## 2015-10-06 ENCOUNTER — Ambulatory Visit (HOSPITAL_BASED_OUTPATIENT_CLINIC_OR_DEPARTMENT_OTHER): Payer: Commercial Managed Care - PPO

## 2015-10-06 VITALS — BP 114/60 | HR 59 | Temp 97.0°F | Resp 20 | Ht 72.0 in | Wt 265.0 lb

## 2015-10-06 DIAGNOSIS — C7931 Secondary malignant neoplasm of brain: Secondary | ICD-10-CM | POA: Diagnosis not present

## 2015-10-06 DIAGNOSIS — C3432 Malignant neoplasm of lower lobe, left bronchus or lung: Secondary | ICD-10-CM

## 2015-10-06 DIAGNOSIS — Z86711 Personal history of pulmonary embolism: Secondary | ICD-10-CM

## 2015-10-06 DIAGNOSIS — Z86718 Personal history of other venous thrombosis and embolism: Secondary | ICD-10-CM

## 2015-10-06 DIAGNOSIS — C787 Secondary malignant neoplasm of liver and intrahepatic bile duct: Secondary | ICD-10-CM | POA: Diagnosis not present

## 2015-10-06 DIAGNOSIS — C3492 Malignant neoplasm of unspecified part of left bronchus or lung: Secondary | ICD-10-CM

## 2015-10-06 DIAGNOSIS — C7951 Secondary malignant neoplasm of bone: Secondary | ICD-10-CM

## 2015-10-06 LAB — CBC & DIFF AND RETIC
BASO%: 0.9 % (ref 0.0–2.0)
Basophils Absolute: 0.1 10*3/uL (ref 0.0–0.1)
EOS%: 19 % — AB (ref 0.0–7.0)
Eosinophils Absolute: 1.1 10*3/uL — ABNORMAL HIGH (ref 0.0–0.5)
HEMATOCRIT: 34 % — AB (ref 38.4–49.9)
HGB: 11 g/dL — ABNORMAL LOW (ref 13.0–17.1)
IMMATURE RETIC FRACT: 16 % — AB (ref 3.00–10.60)
LYMPH#: 1.8 10*3/uL (ref 0.9–3.3)
LYMPH%: 32.1 % (ref 14.0–49.0)
MCH: 30.4 pg (ref 27.2–33.4)
MCHC: 32.4 g/dL (ref 32.0–36.0)
MCV: 93.9 fL (ref 79.3–98.0)
MONO#: 0.3 10*3/uL (ref 0.1–0.9)
MONO%: 5.9 % (ref 0.0–14.0)
NEUT#: 2.4 10*3/uL (ref 1.5–6.5)
NEUT%: 42.1 % (ref 39.0–75.0)
Platelets: 148 10*3/uL (ref 140–400)
RBC: 3.62 10*6/uL — AB (ref 4.20–5.82)
RDW: 15.9 % — ABNORMAL HIGH (ref 11.0–14.6)
RETIC %: 1.73 % (ref 0.80–1.80)
Retic Ct Abs: 62.63 10*3/uL (ref 34.80–93.90)
WBC: 5.6 10*3/uL (ref 4.0–10.3)

## 2015-10-06 LAB — COMPREHENSIVE METABOLIC PANEL (CC13)
ALK PHOS: 70 U/L (ref 40–150)
ALT: 11 U/L (ref 0–55)
ANION GAP: 7 meq/L (ref 3–11)
AST: 15 U/L (ref 5–34)
Albumin: 3.1 g/dL — ABNORMAL LOW (ref 3.5–5.0)
BILIRUBIN TOTAL: 0.44 mg/dL (ref 0.20–1.20)
BUN: 18.7 mg/dL (ref 7.0–26.0)
CO2: 27 meq/L (ref 22–29)
Calcium: 7.9 mg/dL — ABNORMAL LOW (ref 8.4–10.4)
Chloride: 109 mEq/L (ref 98–109)
Creatinine: 0.9 mg/dL (ref 0.7–1.3)
EGFR: 88 mL/min/{1.73_m2} — AB (ref >90)
Glucose: 102 mg/dl (ref 70–140)
POTASSIUM: 4.2 meq/L (ref 3.5–5.1)
SODIUM: 143 meq/L (ref 136–145)
TOTAL PROTEIN: 5.5 g/dL — AB (ref 6.4–8.3)

## 2015-10-06 MED ORDER — DENOSUMAB 120 MG/1.7ML ~~LOC~~ SOLN
120.0000 mg | Freq: Once | SUBCUTANEOUS | Status: AC
  Administered 2015-10-06: 120 mg via SUBCUTANEOUS
  Filled 2015-10-06: qty 1.7

## 2015-10-06 MED ORDER — GADOBENATE DIMEGLUMINE 529 MG/ML IV SOLN
20.0000 mL | Freq: Once | INTRAVENOUS | Status: AC | PRN
  Administered 2015-10-06: 20 mL via INTRAVENOUS

## 2015-10-06 NOTE — Telephone Encounter (Signed)
Gave adn printed appt sched and avs fo rpt for NOV °

## 2015-10-06 NOTE — Progress Notes (Addendum)
[place]  [phone] [phone]  Clinic Follow Up Note   Patient Care Team: [name]. [name], MD as PCP - General (Internal Medicine) [name], MD as Consulting Physician (Hematology) 10/07/2015   CHIEF COMPLAINTS:  Follow up metastatic lung cancer  Oncology History   Metastatic lung cancer (metastasis from lung to other site)   Staging form: Lung, AJCC 7th Edition     Clinical: Stage IV (T3, N3, M1b) - Unsigned        Metastatic lung cancer (metastasis from lung to other site) ([place])   03/24/2015 Imaging brian MRI 1.9 cm high left frontal lobe mass with minimal surrounding edema   03/24/2015 Imaging CT showed Multiple low-density lesions in the liver worrisome for metastatic disease. Consolidative infiltrate in the left lower lobe with interstitial infiltrate in the left upper lobe. Single enlarged left hilar lymph node.   03/27/2015 Initial Diagnosis Metastatic lung cancer (metastasis from lung to other site)   03/27/2015 Pathology Results Metastatic adenocarcinoma, IHC positive for CK 7, TTF-1 and Napsin A. Negative for CK 20, CD X2, PSA, consistent with lung primary   03/27/2015 Miscellaneous Foundation One test showed (+) EML4-ALK fusion (variant 2), CDKN2A/B loss, SMAD4 R361H.    04/07/2015 Imaging PET scan showed hypermetabolic soft tissue infiltrates in the left lower lobe lung, bilateral mediastinal adenopathy,widespread metastatic disease, including to thoracic, low cervical nodes, liver and bones.   04/10/2015 - 04/10/2015 Radiation Therapy SRS to the brain met    04/27/2015 - 09/08/2015 Chemotherapy Crizotinib 240m bid, stopped due to disease preogression    08/08/2015 - 08/12/2015 [facility]  [facility] showed small hemorrhage. Lovenox was held,  and he had a IVC filter Placed. Lovenox was resumed one month later.    09/09/2015 -  Chemotherapy Alectinib 609mbid       HISTORY OF PRESENTING ILLNESS:  Walter Oaks[age].o. male is here because of newly diagnosed metastatic lung cancer.  He has been in good health until 3 month ago, when he started having intermittent nausea, and gradual weight loss a total of 18 lbs since then, although some are intentional by cutting back diet. He denies any vomiting, abdominal pain or discomfort, or change of his bowel habit.  He presented with syncope, right after he noticed some titch of left face, and rolling over of his eyes. He will [name] the ground and lost consciousness. This was witnessed by his wife and other people, 911 was called and paramedic staff underwent CPR for a total of 30 minutes. The cardiac monitor shows that he was in asystole. [facility] He workup in the ambulance, and did not have any significant in neurology deficit afterwards. He was seen by cardiology in the neurology service, cardiac catheterization was negative for coronary artery disease. He underwent a CT of head which showed a 1.9 cm lesion in the left frontal lobe with surrounding edema. CT of the chest reviewed no PE, but significant infiltrative consolidation in the left lower lobe, it was felt to be aspiration pneumonia. CT of the abdomen reviewed multiple diffuse liver metastasis. He underwent a liver biopsy which showed adenocarcinoma.   He was a discharge to home. He has been doing well overall since hospital discharge. He has some residual pain from the rib fracture from CPR. He denies any cough, dyspnea, or other symptoms. He has  mild fatigue and low appetite, which has been chronic since his gastric bypass surgery in 2011.   CURRENT THERAPY: Alectinib 600 mg twice daily started on 09/11/2015  INTERIM HISTORY: Walter Wilkins returns for follow-up and monthly Xgeva injection. He started Alectinib 1 months ago, tolerated very well, no noticeable side effects. He feels better overall, has better  energy level and appetite, eating more and gain some weight. He is also less depressed since he moved to [facility].    MEDICAL HISTORY:  Past Medical History  Diagnosis Date  . Diabetes mellitus without complication ([place])   . H/O gastric bypass     a. ~2011 at [facility].  . Acid reflux   . Seizures ([place]) 03/23/15  . Brain mass 03/24/15    ct head - left frontal lobe mass consistent with a brain metastasis  . Cancer ([place])     mass on lung, brain, sinuses, liver and bones    SURGICAL HISTORY: Past Surgical History  Procedure Laterality Date  . Left heart catheterization with coronary angiogram N/A 03/23/2015    Procedure: LEFT HEART CATHETERIZATION WITH CORONARY ANGIOGRAM;  Surgeon: Burnell Blanks, MD;  Location: [facility] CATH LAB;  Service: Cardiovascular;  Laterality: N/A;  . Gastric bypass  2011  . Liver biopsy  03/27/15  . Appendectomy    . Cholecystectomy    . Right knee osroscopy Right     SOCIAL HISTORY: History   Social History  . Marital Status: Unknown    Spouse Name: N/A  . Number of Children: 2  . Years of Education: N/A   Occupational History  . Barrister's clerk    Social History Main Topics  . Smoking status: Uses cigar   . Smokeless tobacco: Not on file  . Alcohol Use: 0.0 oz/week    0 Standard drinks or equivalent per week     Comment: Occasionally socially - once a week or less  . Drug Use: No  . Sexual Activity: Not on file   Other Topics Concern  . Not on file   Social History Narrative    FAMILY HISTORY: Family History  Problem Relation Age of Onset  . Valvular heart disease Father     H/o pig valve  . Cancer Father     skin cancer   . Sudden death Neg Hx     No family history of sudden cardiac death  . Breast cancer Mother   . Cancer Mother 10    breast cancer   . Cancer Brother 42    base of tongue   . Cancer Maternal Uncle     throat cancer   . Cancer Maternal Uncle 90    colon cancer, and prostate cancer   . Cancer  Cousin     lung cancer     ALLERGIES:  has No Known Allergies.  MEDICATIONS:  Current Outpatient Prescriptions  Medication Sig Dispense Refill  . Alectinib HCl 150 MG CAPS Take 600 mg by mouth 2 (two) times daily. 240 capsule 3  . ALPRAZolam (XANAX) 0.5 MG tablet Take 0.5 mg by mouth 3 (three) times daily as needed.     Marland Kitchen atorvastatin (LIPITOR) 40 MG tablet Take 40 mg by mouth at bedtime. Last Refill 08/22/2014    . blood glucose meter kit and supplies KIT Dispense based on patient and insurance preference. Use up to four times daily as directed. (FOR ICD-9 250.00, 250.01). 1 each 0  . CALCIUM & MAGNESIUM CARBONATES PO Take 500 mg by  mouth 2 (two) times daily.    Marland Kitchen enoxaparin (LOVENOX) 150 MG/ML injection Inject 1 mL (150 mg total) into the skin daily. 30 Syringe 2  . levETIRAcetam (KEPPRA) 1000 MG tablet Take 1 tablet (1,000 mg total) by mouth 2 (two) times daily. 60 tablet 5  . metFORMIN (GLUCOPHAGE) 1000 MG tablet Take 1,000 mg by mouth 2 (two) times daily with a meal. Last refill 08/23/2014    . mirtazapine (REMERON) 45 MG tablet Take 1 tablet (45 mg total) by mouth at bedtime. 30 tablet 2  . Multiple Vitamin (MULTI-VITAMINS) TABS Take by mouth.    Marland Kitchen omeprazole (PRILOSEC) 20 MG capsule Take 20 mg by mouth daily. Last Refill 08/22/2014    . ondansetron (ZOFRAN) 8 MG tablet Take 1 tablet (8 mg total) by mouth every 8 (eight) hours as needed for nausea or vomiting. 30 tablet 3  . traZODone (DESYREL) 50 MG tablet Take 50 mg by mouth at bedtime.     No current facility-administered medications for this visit.    REVIEW OF SYSTEMS:   Constitutional: Denies fevers, chills or abnormal night sweats, (+) fatigue  Eyes: Denies blurriness of vision, double vision or watery eyes Ears, nose, mouth, throat, and face: Denies mucositis or sore throat Respiratory: Denies cough, dyspnea or wheezes Cardiovascular: Denies palpitation, chest discomfort or lower extremity swelling Gastrointestinal:  Mild  intermittent nausea, no heartburn or change in bowel habits Skin: Denies abnormal skin rashes Lymphatics: Denies new lymphadenopathy or easy bruising Neurological:Denies numbness, tingling or new weaknesses Behavioral/Psych: Mood is stable, no new changes  All other systems were reviewed with the patient and are negative.  PHYSICAL EXAMINATION: ECOG PERFORMANCE STATUS: 2  Filed Vitals:   10/06/15 1449  BP: 114/60  Pulse: 59  Temp: 97 F (36.1 C)  Resp: 20   Filed Weights   10/06/15 1449  Weight: 265 lb (120.203 kg)    GENERAL:alert, no distress and comfortable SKIN: skin color, texture, turgor are normal, no rashes or significant lesions EYES: normal, conjunctiva are pink and non-injected, sclera clear OROPHARYNX:no exudate, no erythema and lips, buccal mucosa, and tongue normal  NECK: supple, thyroid normal size, non-tender, without nodularity LYMPH:  no palpable lymphadenopathy in the cervical, axillary or inguinal LUNGS: clear to auscultation and percussion with normal breathing effort HEART: regular rate & rhythm and no murmurs and no lower extremity edema ABDOMEN:abdomen soft, non-tender and normal bowel sounds Musculoskeletal:no cyanosis of digits and no clubbing  PSYCH: alert & oriented x 3 with fluent speech NEURO: no focal motor/sensory deficits  LABORATORY DATA:  I have reviewed the data as listed CBC Latest Ref Rng 10/06/2015 09/01/2015 07/31/2015  WBC 4.0 - 10.3 10e3/uL 5.6 5.4 5.2  Hemoglobin 13.0 - 17.1 g/dL 11.0(L) 13.4 13.2  Hematocrit 38.4 - 49.9 % 34.0(L) 41.2 39.5  Platelets 140 - 400 10e3/uL 148 210 184    CMP Latest Ref Rng 10/06/2015 09/01/2015 07/31/2015  Glucose 70 - 140 mg/dl 102 118 119  BUN 7.0 - 26.0 mg/dL 18.7 18.6 9.8  Creatinine 0.7 - 1.3 mg/dL 0.9 0.8 0.8  Sodium 136 - 145 mEq/L 143 141 142  Potassium 3.5 - 5.1 mEq/L 4.2 3.5 4.1  Chloride 96 - 112 mmol/L - - -  CO2 22 - 29 mEq/L _0 Calcium 8.4 - 10.4 mg/dL 7.9(L) 7.8(L) 7.3(L)    Total Protein 6.4 - 8.3 g/dL 5.5(L) 5.4(L) 5.3(L)  Total Bilirubin 0.20 - 1.20 mg/dL 0.44 0.59 0.42  Alkaline Phos 40 - 150 U/L  70 110 96  AST 5 - 34 U/L _0 ALT 0 - 55 U/L _1 PATHOLOGY REPORT: Liver, needle/core biopsy, right lobe 03/27/2015  - METASTATIC ADENOCARCINOMA, SEE COMMENT. Microscopic Comment The adenocarcinoma demonstrates the following immunophenotype Cytokeratin 7 - strong diffuse expression. Cytokeratin 20 - negative expression. TTF-1 - patchy moderate strong expression. Napsin A - strong diffuse expression. CDX-2 - negative expression. PSA - negative expression. Overall, the morphology and immunophenotype are that of metastatic adenocarcinoma, primary to lung. There is tumor available for ancillary tumor testing. The case is reviewed with Dr. Lyndon Code who concurs. The case was discussed with Dr Burr Medico on 03/29/2015. (CRR:gt:ecj, 03/29/15)  RADIOGRAPHIC STUDIES: I have personally reviewed the radiological images as listed and agreed with the findings in the report.  PET 09/01/2015 IMPRESSION: 1. Mixed interval metabolic response compared to 04/07/15 PET-CT. Resolved hypermetabolism in the neck nodes. Increased hypermetabolism in the primary left lower lobe malignant pulmonary nodule with progressive lymphangitic carcinomatosis in the left lung. Increased hypermetabolism in the left infrahilar node. Mixed metabolic response in the liver and sclerotic osseous metastases. 2. New hypermetabolic medial right lower lobe focus of ground-glass opacity, suspect inflammatory/infectious etiology.  Brain MRI with and without contrast 10/06/2015 IMPRESSION: Hemorrhagic metastasis in the left posterior frontal lobe may be slightly larger in shows slightly more prominent enhancement. This could be due to 3 Tesla scanning on the current study. Moderate vasogenic edema stable. Close follow-up is suggested  Sclerotic metastasis right clivus is unchanged. No new  lesions.   ASSESSMENT & PLAN:  59 year old Caucasian male with minimal smoking history, obesity status post gastric bypass surgery, diabetes, presented with cardiac arrest and presumed seizure. Imaging study showed left lower lobe infiltrative consultation, left hilar adenopathy, diffuse liver metastasis and left front lobe brain metastasis.  1. 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 Images were reviewed in person. -We discussed that his cancer is incurable at this stage, but treatable. We now have a 3 ALK inhibitors available for this type of lung cancer, with very good response rate and significantly increase patient's survival. -I discussed his recent restaging PET scan findings. It showed a mixed response, I suspect he has resistant clone in his tumor now, very well I changed crizotinib to Alectinib, which was approved for crizotinib resistant ALK positive non-small cell lung cancer.  -He is tolerating Alectinib fairly well, overall feels better. We'll continue. -Repeat PET scan in 2 months.  2. Brain mets -s/p SRS, follow-up with radiation oncology -His restaging brain MRI showed good response to treatment -He had restaging brain MRI this morning. Results was reviewed with patient. I also discussed with Dr. Isidore Moos, his case will be presented in CNS tumor board next Monday. Dr. Isidore Moos will call him after tumor board. -Alectinib has some CNS penetration, hopefully will control his brain mets also    3. Bone mets -Xgeva, continue every 4 weeks.  -He is taking calcium and vitamin D.  4. history of cardiac arrest -Likely related to his breathing metastasis and seizure -Cardiac cath was negative for coronary artery disease  5. Seizure, secondary to brain metastasis -Continue Keppra  -He will follow up with neurologist. He has not  driven since the seizure.  6.  diabetes  -Continue follow-up with primary care physician.   7. DVT and Pulmonary embolism, diagnosed on 06/15/2015 -  He was discharged home from hospital with Lovenox 120 mg daily, this is insufficient dose based on his body weight. Recent CT scan also showed persistent right pulmonary embolus - due to questionable hemorrhage around his brain metastases, his Lovenox was held. After tumor board discussion, we felt the benefit of anticoagulation is outweight the risk of bleeding, and he restarted the Lovenox once daily.  9. Depression -Improved, since he moved to [facility]. -continue mirtazapine 19m at bedtime.   Follow-up -Continue Alectinib  -Xgeva today and monthly -RTC in one month    All questions were answered. The patient knows to call the clinic with any problems, questions or concerns. I spent 25 minutes counseling the patient face to face. The total time spent in the appointment was 30 minutes and more than 50% was on counseling.     FTruitt Merle MD 10/06/2015   9:37 AM

## 2015-10-06 NOTE — Patient Instructions (Signed)
Denosumab injection  What is this medicine?  DENOSUMAB (den oh sue mab) slows bone breakdown. Prolia is used to treat osteoporosis in women after menopause and in men. Xgeva is used to prevent bone fractures and other bone problems caused by cancer bone metastases. Xgeva is also used to treat giant cell tumor of the bone.  This medicine may be used for other purposes; ask your health care provider or pharmacist if you have questions.  What should I tell my health care provider before I take this medicine?  They need to know if you have any of these conditions:  -dental disease  -eczema  -infection or history of infections  -kidney disease or on dialysis  -low blood calcium or vitamin D  -malabsorption syndrome  -scheduled to have surgery or tooth extraction  -taking medicine that contains denosumab  -thyroid or parathyroid disease  -an unusual reaction to denosumab, other medicines, foods, dyes, or preservatives  -pregnant or trying to get pregnant  -breast-feeding  How should I use this medicine?  This medicine is for injection under the skin. It is given by a health care professional in a hospital or clinic setting.  If you are getting Prolia, a special MedGuide will be given to you by the pharmacist with each prescription and refill. Be sure to read this information carefully each time.  For Prolia, talk to your pediatrician regarding the use of this medicine in children. Special care may be needed. For Xgeva, talk to your pediatrician regarding the use of this medicine in children. While this drug may be prescribed for children as young as 13 years for selected conditions, precautions do apply.  Overdosage: If you think you have taken too much of this medicine contact a poison control center or emergency room at once.  NOTE: This medicine is only for you. Do not share this medicine with others.  What if I miss a dose?  It is important not to miss your dose. Call your doctor or health care professional if you are  unable to keep an appointment.  What may interact with this medicine?  Do not take this medicine with any of the following medications:  -other medicines containing denosumab  This medicine may also interact with the following medications:  -medicines that suppress the immune system  -medicines that treat cancer  -steroid medicines like prednisone or cortisone  This list may not describe all possible interactions. Give your health care provider a list of all the medicines, herbs, non-prescription drugs, or dietary supplements you use. Also tell them if you smoke, drink alcohol, or use illegal drugs. Some items may interact with your medicine.  What should I watch for while using this medicine?  Visit your doctor or health care professional for regular checks on your progress. Your doctor or health care professional may order blood tests and other tests to see how you are doing.  Call your doctor or health care professional if you get a cold or other infection while receiving this medicine. Do not treat yourself. This medicine may decrease your body's ability to fight infection.  You should make sure you get enough calcium and vitamin D while you are taking this medicine, unless your doctor tells you not to. Discuss the foods you eat and the vitamins you take with your health care professional.  See your dentist regularly. Brush and floss your teeth as directed. Before you have any dental work done, tell your dentist you are receiving this medicine.  Do   not become pregnant while taking this medicine or for 5 months after stopping it. Women should inform their doctor if they wish to become pregnant or think they might be pregnant. There is a potential for serious side effects to an unborn child. Talk to your health care professional or pharmacist for more information.  What side effects may I notice from receiving this medicine?  Side effects that you should report to your doctor or health care professional as soon as  possible:  -allergic reactions like skin rash, itching or hives, swelling of the face, lips, or tongue  -breathing problems  -chest pain  -fast, irregular heartbeat  -feeling faint or lightheaded, falls  -fever, chills, or any other sign of infection  -muscle spasms, tightening, or twitches  -numbness or tingling  -skin blisters or bumps, or is dry, peels, or red  -slow healing or unexplained pain in the mouth or jaw  -unusual bleeding or bruising  Side effects that usually do not require medical attention (Report these to your doctor or health care professional if they continue or are bothersome.):  -muscle pain  -stomach upset, gas  This list may not describe all possible side effects. Call your doctor for medical advice about side effects. You may report side effects to FDA at 1-800-FDA-1088.  Where should I keep my medicine?  This medicine is only given in a clinic, doctor's office, or other health care setting and will not be stored at home.  NOTE: This sheet is a summary. It may not cover all possible information. If you have questions about this medicine, talk to your doctor, pharmacist, or health care provider.      2016, Elsevier/Gold Standard. (2012-06-08 12:37:47)

## 2015-10-10 ENCOUNTER — Encounter: Payer: Self-pay | Admitting: Radiation Therapy

## 2015-10-11 ENCOUNTER — Other Ambulatory Visit: Payer: Self-pay | Admitting: *Deleted

## 2015-10-11 ENCOUNTER — Telehealth: Payer: Self-pay | Admitting: *Deleted

## 2015-10-11 DIAGNOSIS — C3490 Malignant neoplasm of unspecified part of unspecified bronchus or lung: Secondary | ICD-10-CM

## 2015-10-11 MED ORDER — ENOXAPARIN SODIUM 100 MG/ML ~~LOC~~ SOLN: 100.0000 mg | SUBCUTANEOUS | Status: DC

## 2015-10-11 NOTE — Telephone Encounter (Signed)
Spoke with wife Ivin Booty and informed her re:  Dr. Burr Medico would like to decrease Lovenox to 100 mg daily - starting today.   Rx e-scribed  to [facility].  Ivin Booty voiced understanding.

## 2015-11-03 ENCOUNTER — Ambulatory Visit: Payer: Commercial Managed Care - PPO

## 2015-11-03 ENCOUNTER — Ambulatory Visit: Payer: Commercial Managed Care - PPO | Admitting: Hematology

## 2015-11-03 ENCOUNTER — Other Ambulatory Visit: Payer: Commercial Managed Care - PPO

## 2015-11-08 ENCOUNTER — Telehealth: Payer: Self-pay | Admitting: *Deleted

## 2015-11-08 NOTE — Telephone Encounter (Signed)
Call received from [facility], Case Manager, [facility], [place] [phone].  "Calling to speak with Navigator for Dr. Burr Medico.  Patient is at the [facility]  I'm apart of [employer] through his wife's employer.  We need more information about his goals so we do not duplicate any services.  We provide services for 90 days.  What services, programs or opportunities is this patient receiving there?" Will route to [place].  Anderson Malta notified and awaiting return call to her direct line noted above.

## 2015-11-08 NOTE — Telephone Encounter (Signed)
I have called Walter Wilkins back and spoke with her about our service here. Their program also offer SW, dietician service, and nursing service, I will let pt know when I see him next time.  [name]  11/08/2015

## 2015-11-10 ENCOUNTER — Encounter: Payer: Self-pay | Admitting: *Deleted

## 2015-11-10 NOTE — Progress Notes (Signed)
Oncology Nurse Navigator Documentation  Oncology Nurse Navigator Flowsheets 11/10/2015  Navigator Encounter Type Telephone/I received a message from Triage that a patient advocate from [facility] would like patient to receive "services".  I called her back and left a vm message with my name and phone number  Treatment Phase Other  Time Spent with Patient 15

## 2015-11-14 ENCOUNTER — Encounter: Payer: Self-pay | Admitting: Hematology

## 2015-11-14 ENCOUNTER — Ambulatory Visit (HOSPITAL_BASED_OUTPATIENT_CLINIC_OR_DEPARTMENT_OTHER): Payer: Commercial Managed Care - PPO | Admitting: Hematology

## 2015-11-14 ENCOUNTER — Other Ambulatory Visit (HOSPITAL_BASED_OUTPATIENT_CLINIC_OR_DEPARTMENT_OTHER): Payer: Commercial Managed Care - PPO

## 2015-11-14 ENCOUNTER — Ambulatory Visit (HOSPITAL_BASED_OUTPATIENT_CLINIC_OR_DEPARTMENT_OTHER): Payer: Commercial Managed Care - PPO

## 2015-11-14 VITALS — BP 138/68 | HR 56 | Temp 97.7°F | Resp 19 | Ht 72.0 in | Wt 275.8 lb

## 2015-11-14 DIAGNOSIS — F329 Major depressive disorder, single episode, unspecified: Secondary | ICD-10-CM | POA: Diagnosis not present

## 2015-11-14 DIAGNOSIS — I82409 Acute embolism and thrombosis of unspecified deep veins of unspecified lower extremity: Secondary | ICD-10-CM

## 2015-11-14 DIAGNOSIS — I2699 Other pulmonary embolism without acute cor pulmonale: Secondary | ICD-10-CM

## 2015-11-14 DIAGNOSIS — C7951 Secondary malignant neoplasm of bone: Secondary | ICD-10-CM

## 2015-11-14 DIAGNOSIS — C3432 Malignant neoplasm of lower lobe, left bronchus or lung: Secondary | ICD-10-CM

## 2015-11-14 DIAGNOSIS — C7931 Secondary malignant neoplasm of brain: Secondary | ICD-10-CM | POA: Diagnosis not present

## 2015-11-14 DIAGNOSIS — C3492 Malignant neoplasm of unspecified part of left bronchus or lung: Secondary | ICD-10-CM

## 2015-11-14 DIAGNOSIS — E119 Type 2 diabetes mellitus without complications: Secondary | ICD-10-CM

## 2015-11-14 LAB — CBC & DIFF AND RETIC
BASO%: 0.8 % (ref 0.0–2.0)
Basophils Absolute: 0.1 10*3/uL (ref 0.0–0.1)
EOS ABS: 0.9 10*3/uL — AB (ref 0.0–0.5)
EOS%: 12 % — ABNORMAL HIGH (ref 0.0–7.0)
HCT: 34.6 % — ABNORMAL LOW (ref 38.4–49.9)
HEMOGLOBIN: 11.2 g/dL — AB (ref 13.0–17.1)
IMMATURE RETIC FRACT: 18.1 % — AB (ref 3.00–10.60)
LYMPH%: 27.2 % (ref 14.0–49.0)
MCH: 31.6 pg (ref 27.2–33.4)
MCHC: 32.4 g/dL (ref 32.0–36.0)
MCV: 97.7 fL (ref 79.3–98.0)
MONO#: 0.6 10*3/uL (ref 0.1–0.9)
MONO%: 8 % (ref 0.0–14.0)
NEUT%: 52 % (ref 39.0–75.0)
NEUTROS ABS: 3.8 10*3/uL (ref 1.5–6.5)
Platelets: 176 10*3/uL (ref 140–400)
RBC: 3.54 10*6/uL — ABNORMAL LOW (ref 4.20–5.82)
RDW: 16.2 % — AB (ref 11.0–14.6)
RETIC %: 3.03 % — AB (ref 0.80–1.80)
Retic Ct Abs: 107.26 10*3/uL — ABNORMAL HIGH (ref 34.80–93.90)
WBC: 7.2 10*3/uL (ref 4.0–10.3)
lymph#: 2 10*3/uL (ref 0.9–3.3)

## 2015-11-14 LAB — COMPREHENSIVE METABOLIC PANEL (CC13)
ALBUMIN: 3.7 g/dL (ref 3.5–5.0)
ALK PHOS: 100 U/L (ref 40–150)
ALT: 14 U/L (ref 0–55)
AST: 16 U/L (ref 5–34)
Anion Gap: 8 mEq/L (ref 3–11)
BILIRUBIN TOTAL: 0.7 mg/dL (ref 0.20–1.20)
BUN: 22.5 mg/dL (ref 7.0–26.0)
CO2: 25 mEq/L (ref 22–29)
Calcium: 9.1 mg/dL (ref 8.4–10.4)
Chloride: 107 mEq/L (ref 98–109)
Creatinine: 1.1 mg/dL (ref 0.7–1.3)
EGFR: 69 mL/min/{1.73_m2} — ABNORMAL LOW (ref >90)
GLUCOSE: 165 mg/dL — AB (ref 70–140)
POTASSIUM: 4.6 meq/L (ref 3.5–5.1)
SODIUM: 140 meq/L (ref 136–145)
TOTAL PROTEIN: 6.3 g/dL — AB (ref 6.4–8.3)

## 2015-11-14 MED ORDER — DENOSUMAB 120 MG/1.7ML ~~LOC~~ SOLN
120.0000 mg | Freq: Once | SUBCUTANEOUS | Status: AC
  Administered 2015-11-14: 120 mg via SUBCUTANEOUS
  Filled 2015-11-14: qty 1.7

## 2015-11-14 NOTE — Progress Notes (Signed)
[facility]  [phone] [phone]  Clinic Follow Up Note   Patient Care Team: [name]. [name], MD as PCP - General (Internal Medicine) [name], MD as Consulting Physician (Hematology) 11/14/2015   CHIEF COMPLAINTS:  Follow up metastatic lung cancer  Oncology History   Metastatic lung cancer (metastasis from lung to other site)   Staging form: Lung, AJCC 7th Edition     Clinical: Stage IV (T3, N3, M1b) - Unsigned        Metastatic lung cancer (metastasis from lung to other site) ([place])   03/24/2015 Imaging brian MRI 1.9 cm high left frontal lobe mass with minimal surrounding edema   03/24/2015 Imaging CT showed Multiple low-density lesions in the liver worrisome for metastatic disease. Consolidative infiltrate in the left lower lobe with interstitial infiltrate in the left upper lobe. Single enlarged left hilar lymph node.   03/27/2015 Initial Diagnosis Metastatic lung cancer (metastasis from lung to other site)   03/27/2015 Pathology Results Metastatic adenocarcinoma, IHC positive for CK 7, TTF-1 and Napsin A. Negative for CK 20, CD X2, PSA, consistent with lung primary   03/27/2015 Miscellaneous Foundation One test showed (+) EML4-ALK fusion (variant 2), CDKN2A/B loss, SMAD4 R361H.    04/07/2015 Imaging PET scan showed hypermetabolic soft tissue infiltrates in the left lower lobe lung, bilateral mediastinal adenopathy,widespread metastatic disease, including to thoracic, low cervical nodes, liver and bones.   04/10/2015 - 04/10/2015 Radiation Therapy SRS to the brain met    04/27/2015 - 09/08/2015 Chemotherapy Crizotinib 282m bid, stopped due to disease preogression    08/08/2015 - 08/12/2015 [facility]  [facility] showed small hemorrhage. Lovenox was held,  and he had a IVC filter Placed. Lovenox was resumed one month later.    09/09/2015 -  Chemotherapy Alectinib 6064mbid       HISTORY OF PRESENTING ILLNESS:  [place][age].o. male is here because of newly diagnosed metastatic lung cancer.  He has been in good health until 3 month ago, when he started having intermittent nausea, and gradual weight loss a total of 18 lbs since then, although some are intentional by cutting back diet. He denies any vomiting, abdominal pain or discomfort, or change of his bowel habit.  He presented with syncope, right after he noticed some titch of left face, and rolling over of his eyes. He will [name] the ground and lost consciousness. This was witnessed by his wife and other people, 911 was called and paramedic staff underwent CPR for a total of 30 minutes. The cardiac monitor shows that he was in asystole. [facility] He workup in the ambulance, and did not have any significant in neurology deficit afterwards. He was seen by cardiology in the neurology service, cardiac catheterization was negative for coronary artery disease. He underwent a CT of head which showed a 1.9 cm lesion in the left frontal lobe with surrounding edema. CT of the chest reviewed no PE, but significant infiltrative consolidation in the left lower lobe, it was felt to be aspiration pneumonia. CT of the abdomen reviewed multiple diffuse liver metastasis. He underwent a liver biopsy which showed adenocarcinoma.   He was a discharge to home. He has been doing well overall since hospital discharge. He has some residual pain from the rib fracture from CPR. He denies any cough, dyspnea, or other symptoms. He has  mild fatigue and low appetite, which has been chronic since his gastric bypass surgery in 2011.   CURRENT THERAPY: Alectinib 600 mg twice daily started on 09/11/2015  INTERIM HISTORY: Rubert returns for follow-up and monthly Xgeva injection. He has a few episodes of small seizure (words finding difficulty) in the past few weeks, and he increased his keppra  dose. He otherwise He is doing well overall, he has good appetite and eating well, has more energy, tolerating routine daily activities without any difficulty. He denies any cough, chest pain, shortness breasts or other symptoms. He has been tolerating Alectinib very well without noticeable side effects. His depression has also improved since he moved to [facility]. He is accompanied to the clinic by his wife and brother today. His wife still works part-time at [employer], and he communicates between here and [facility].  MEDICAL HISTORY:  Past Medical History  Diagnosis Date  . Diabetes mellitus without complication ([place])   . H/O gastric bypass     a. ~2011 [facility].  . Acid reflux   . Seizures ([place]) 03/23/15  . Brain mass 03/24/15    ct head - left frontal lobe mass consistent with a brain metastasis  . Cancer ([place])     mass on lung, brain, sinuses, liver and bones    SURGICAL HISTORY: Past Surgical History  Procedure Laterality Date  . Left heart catheterization with coronary angiogram N/A 03/23/2015    Procedure: LEFT HEART CATHETERIZATION WITH CORONARY ANGIOGRAM;  Surgeon: Burnell Blanks, MD;  Location: [facility] CATH LAB;  Service: Cardiovascular;  Laterality: N/A;  . Gastric bypass  2011  . Liver biopsy  03/27/15  . Appendectomy    . Cholecystectomy    . Right knee osroscopy Right     SOCIAL HISTORY: History   Social History  . Marital Status: Unknown    Spouse Name: N/A  . Number of Children: 2  . Years of Education: N/A   Occupational History  . Barrister's clerk    Social History Main Topics  . Smoking status: Uses cigar   . Smokeless tobacco: Not on file  . Alcohol Use: 0.0 oz/week    0 Standard drinks or equivalent per week     Comment: Occasionally socially - once a week or less  . Drug Use: No  . Sexual Activity: Not on file   Other Topics Concern  . Not on file   Social History Narrative    FAMILY HISTORY: Family History   Problem Relation Age of Onset  . Valvular heart disease Father     H/o pig valve  . Cancer Father     skin cancer   . Sudden death Neg Hx     No family history of sudden cardiac death  . Breast cancer Mother   . Cancer Mother 79    breast cancer   . Cancer Brother 42    base of tongue   . Cancer Maternal Uncle     throat cancer   . Cancer Maternal Uncle 90    colon cancer, and prostate cancer   . Cancer Cousin     lung cancer     ALLERGIES:  has No Known Allergies.  MEDICATIONS:  Current Outpatient Prescriptions  Medication Sig Dispense Refill  . Alectinib HCl 150 MG CAPS Take 600 mg by mouth 2 (two) times daily. 240 capsule 3  . ALPRAZolam (XANAX) 0.5 MG tablet Take 0.5 mg by mouth 3 (three) times  daily as needed.     Marland Kitchen atorvastatin (LIPITOR) 40 MG tablet Take 40 mg by mouth at bedtime. Last Refill 08/22/2014    . blood glucose meter kit and supplies KIT Dispense based on patient and insurance preference. Use up to four times daily as directed. (FOR ICD-9 250.00, 250.01). 1 each 0  . CALCIUM & MAGNESIUM CARBONATES PO Take 500 mg by mouth 2 (two) times daily.    Marland Kitchen enoxaparin (LOVENOX) 100 MG/ML injection Inject 1 mL (100 mg total) into the skin daily. 30 Syringe 3  . levETIRAcetam (KEPPRA) 1000 MG tablet Take 1 tablet (1,000 mg total) by mouth 2 (two) times daily. 60 tablet 5  . metFORMIN (GLUCOPHAGE) 1000 MG tablet Take 1,000 mg by mouth 2 (two) times daily with a meal. Last refill 08/23/2014    . mirtazapine (REMERON) 45 MG tablet Take 1 tablet (45 mg total) by mouth at bedtime. 30 tablet 2  . Multiple Vitamin (MULTI-VITAMINS) TABS Take by mouth.    Marland Kitchen omeprazole (PRILOSEC) 20 MG capsule Take 20 mg by mouth daily. Last Refill 08/22/2014    . ondansetron (ZOFRAN) 8 MG tablet Take 1 tablet (8 mg total) by mouth every 8 (eight) hours as needed for nausea or vomiting. 30 tablet 3  . traZODone (DESYREL) 50 MG tablet Take 50 mg by mouth at bedtime.     No current facility-administered  medications for this visit.    REVIEW OF SYSTEMS:   Constitutional: Denies fevers, chills or abnormal night sweats, (+) fatigue  Eyes: Denies blurriness of vision, double vision or watery eyes Ears, nose, mouth, throat, and face: Denies mucositis or sore throat Respiratory: Denies cough, dyspnea or wheezes Cardiovascular: Denies palpitation, chest discomfort or lower extremity swelling Gastrointestinal:  Mild intermittent nausea, no heartburn or change in bowel habits Skin: Denies abnormal skin rashes Lymphatics: Denies new lymphadenopathy or easy bruising Neurological:Denies numbness, tingling or new weaknesses Behavioral/Psych: Mood is stable, no new changes  All other systems were reviewed with the patient and are negative.  PHYSICAL EXAMINATION: ECOG PERFORMANCE STATUS: 2  Filed Vitals:   11/14/15 1115  BP: 138/68  Pulse: 56  Temp: 97.7 F (36.5 C)  Resp: 19   Filed Weights   11/14/15 1115  Weight: 275 lb 12.8 oz (125.102 kg)    GENERAL:alert, no distress and comfortable SKIN: skin color, texture, turgor are normal, no rashes or significant lesions EYES: normal, conjunctiva are pink and non-injected, sclera clear OROPHARYNX:no exudate, no erythema and lips, buccal mucosa, and tongue normal  NECK: supple, thyroid normal size, non-tender, without nodularity LYMPH:  no palpable lymphadenopathy in the cervical, axillary or inguinal LUNGS: clear to auscultation and percussion with normal breathing effort HEART: regular rate & rhythm and no murmurs and no lower extremity edema ABDOMEN:abdomen soft, non-tender and normal bowel sounds Musculoskeletal:no cyanosis of digits and no clubbing  PSYCH: alert & oriented x 3 with fluent speech NEURO: no focal motor/sensory deficits  LABORATORY DATA:  I have reviewed the data as listed CBC Latest Ref Rng 11/14/2015 10/06/2015 09/01/2015  WBC 4.0 - 10.3 10e3/uL 7.2 5.6 5.4  Hemoglobin 13.0 - 17.1 g/dL 11.2(L) 11.0(L) 13.4  Hematocrit  38.4 - 49.9 % 34.6(L) 34.0(L) 41.2  Platelets 140 - 400 10e3/uL 176 148 210    CMP Latest Ref Rng 11/14/2015 10/06/2015 09/01/2015  Glucose 70 - 140 mg/dl 165(H) 102 118  BUN 7.0 - 26.0 mg/dL 22.5 18.7 18.6  Creatinine 0.7 - 1.3 mg/dL 1.1 0.9 0.8  Sodium 136 -  145 mEq/L 140 143 141  Potassium 3.5 - 5.1 mEq/L 4.6 4.2 3.5  Chloride 96 - 112 mmol/L - - -  CO2 22 - 29 mEq/L 25 27 28   Calcium 8.4 - 10.4 mg/dL 9.1 7.9(L) 7.8(L)  Total Protein 6.4 - 8.3 g/dL 6.3(L) 5.5(L) 5.4(L)  Total Bilirubin 0.20 - 1.20 mg/dL 0.70 0.44 0.59  Alkaline Phos 40 - 150 U/L 100 70 110  AST 5 - 34 U/L 16 15 23   ALT 0 - 55 U/L 14 11 23       PATHOLOGY REPORT: Liver, needle/core biopsy, right lobe 03/27/2015  - METASTATIC ADENOCARCINOMA, SEE COMMENT. Microscopic Comment The adenocarcinoma demonstrates the following immunophenotype Cytokeratin 7 - strong diffuse expression. Cytokeratin 20 - negative expression. TTF-1 - patchy moderate strong expression. Napsin A - strong diffuse expression. CDX-2 - negative expression. PSA - negative expression. Overall, the morphology and immunophenotype are that of metastatic adenocarcinoma, primary to lung. There is tumor available for ancillary tumor testing. The case is reviewed with Dr. Lyndon Code who concurs. The case was discussed with Dr Burr Medico on 03/29/2015. (CRR:gt:ecj, 03/29/15)  RADIOGRAPHIC STUDIES: I have personally reviewed the radiological images as listed and agreed with the findings in the report.  PET 09/01/2015 IMPRESSION: 1. Mixed interval metabolic response compared to 04/07/15 PET-CT. Resolved hypermetabolism in the neck nodes. Increased hypermetabolism in the primary left lower lobe malignant pulmonary nodule with progressive lymphangitic carcinomatosis in the left lung. Increased hypermetabolism in the left infrahilar node. Mixed metabolic response in the liver and sclerotic osseous metastases. 2. New hypermetabolic medial right lower lobe focus of  ground-glass opacity, suspect inflammatory/infectious etiology.  Brain MRI with and without contrast 10/06/2015 IMPRESSION: Hemorrhagic metastasis in the left posterior frontal lobe may be slightly larger in shows slightly more prominent enhancement. This could be due to 3 Tesla scanning on the current study. Moderate vasogenic edema stable. Close follow-up is suggested  Sclerotic metastasis right clivus is unchanged. No new lesions.   ASSESSMENT & PLAN:  59 year old Caucasian male with minimal smoking history, obesity status post gastric bypass surgery, diabetes, presented with cardiac arrest and presumed seizure. Imaging study showed left lower lobe infiltrative consultation, left hilar adenopathy, diffuse liver metastasis and left front lobe brain metastasis.  1. 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 Images were reviewed in person. -We discussed that his cancer is incurable at this stage, but treatable. We now have a 3 ALK inhibitors available for this type of lung cancer, with very good response rate and significantly increase patient's survival. -I discussed his recent restaging PET scan findings. It showed a mixed response, I suspect he has resistant clone in his tumor now, and recommended him to change crizotinib to Alectinib, which was approved for crizotinib resistant ALK positive non-small cell lung cancer.  -He is tolerating Alectinib fairly well, overall feels better. We'll continue. -Repeat PET scan in 1 months.  2. Brain mets -s/p SRS, follow-up with radiation oncology -His restaging brain MRI showed good response to treatment -He Is scheduled to see Dr. Vertell Limber tomorrow  -Alectinib has some CNS penetration, hopefully will control his brain mets also    3. Bone mets -Xgeva, continue every 4 weeks.    -He is taking calcium and vitamin D.  4. history of cardiac arrest -Likely related to his breathing metastasis and seizure -Cardiac cath was negative for coronary artery disease  5. Seizure, secondary to brain metastasis -Continue Keppra, his does has increased lately  -He will follow up with neurologist. He has not driven since the seizure.  6.  diabetes  -Continue follow-up with primary care physician.   7. DVT and Pulmonary embolism, diagnosed on 06/15/2015 -he is on low dose of lovenox 165m daily now, due to the concern of tumor hemorrhage in brain   9. Depression -[facility] -continue mirtazapine 421mat bedtime.   10. Anemia -mild and stable, developed since he started Alectinib, likely related  -will follow up closely  Follow-up -Continue Alectinib  -Xgeva today and monthly -RTC in one month with a restaging PET    All questions were answered. The patient knows to call the clinic with any problems, questions or concerns. I spent 25 minutes counseling the patient face to face. The total time spent in the appointment was 30 minutes and more than 50% was on counseling.     FeTruitt MerleMD 11/14/2015    7:57 AM

## 2015-11-15 ENCOUNTER — Telehealth: Payer: Self-pay | Admitting: Neurology

## 2015-11-15 MED ORDER — LEVETIRACETAM 750 MG PO TABS: 1500.0000 mg | ORAL_TABLET | Freq: Two times a day (BID) | ORAL | Status: DC

## 2015-11-15 NOTE — Telephone Encounter (Signed)
I left a voicemail stating that, per our work in doctor, we will wait until Dr. Jannifer Franklin returns on Monday to make any adjustments in medications. I advised they call back if they have any questions.

## 2015-11-15 NOTE — Telephone Encounter (Signed)
I called the patient. OK to increase the keppra to 1500 mg bid.

## 2015-11-15 NOTE — Telephone Encounter (Signed)
Keep current dose and contact Dr. Jannifer Franklin next week. Since his last seizures were already over a week ago.CD

## 2015-11-15 NOTE — Telephone Encounter (Signed)
Patient's wife is calling. The patient  takes levETIRAcetam (KEPPRA) 1000 MG tablet but had mild seizures on 11-7, 9 and 11-15. Should the patient continue the dosage of levETIRAcetam (KEPPRA) 1000 MG tablet or should the medication be increased. Please call and advise. It is ok to leave a message. Thank you.

## 2015-11-28 ENCOUNTER — Other Ambulatory Visit: Payer: Self-pay | Admitting: Hematology

## 2015-12-11 ENCOUNTER — Telehealth: Payer: Self-pay | Admitting: Hematology

## 2015-12-11 ENCOUNTER — Other Ambulatory Visit: Payer: Commercial Managed Care - PPO

## 2015-12-11 ENCOUNTER — Other Ambulatory Visit: Payer: Self-pay | Admitting: *Deleted

## 2015-12-11 DIAGNOSIS — C3492 Malignant neoplasm of unspecified part of left bronchus or lung: Secondary | ICD-10-CM

## 2015-12-11 NOTE — Telephone Encounter (Signed)
per pof to sch pt appt-gave pt copy of avs °

## 2015-12-11 NOTE — Telephone Encounter (Signed)
cld & spoke to wife Ivin Booty and adv of lab appt '@10'$ :30

## 2015-12-13 ENCOUNTER — Ambulatory Visit (HOSPITAL_BASED_OUTPATIENT_CLINIC_OR_DEPARTMENT_OTHER): Payer: Commercial Managed Care - PPO | Admitting: Hematology

## 2015-12-13 ENCOUNTER — Ambulatory Visit (HOSPITAL_COMMUNITY)
Admission: RE | Admit: 2015-12-13 | Discharge: 2015-12-13 | Disposition: A | Discharge status: Home / Self Care | Payer: Commercial Managed Care - PPO | Source: Ambulatory Visit | Attending: Hematology | Admitting: Hematology

## 2015-12-13 ENCOUNTER — Ambulatory Visit (HOSPITAL_BASED_OUTPATIENT_CLINIC_OR_DEPARTMENT_OTHER): Payer: Commercial Managed Care - PPO

## 2015-12-13 ENCOUNTER — Other Ambulatory Visit (HOSPITAL_BASED_OUTPATIENT_CLINIC_OR_DEPARTMENT_OTHER): Payer: Commercial Managed Care - PPO

## 2015-12-13 ENCOUNTER — Telehealth: Payer: Self-pay | Admitting: Hematology

## 2015-12-13 VITALS — BP 133/64 | HR 65 | Temp 98.0°F | Resp 20 | Ht 72.0 in | Wt 280.7 lb

## 2015-12-13 DIAGNOSIS — C7951 Secondary malignant neoplasm of bone: Secondary | ICD-10-CM | POA: Insufficient documentation

## 2015-12-13 DIAGNOSIS — C3492 Malignant neoplasm of unspecified part of left bronchus or lung: Secondary | ICD-10-CM

## 2015-12-13 DIAGNOSIS — K4090 Unilateral inguinal hernia, without obstruction or gangrene, not specified as recurrent: Secondary | ICD-10-CM | POA: Diagnosis not present

## 2015-12-13 DIAGNOSIS — E669 Obesity, unspecified: Secondary | ICD-10-CM

## 2015-12-13 DIAGNOSIS — C7931 Secondary malignant neoplasm of brain: Secondary | ICD-10-CM | POA: Diagnosis not present

## 2015-12-13 DIAGNOSIS — E1169 Type 2 diabetes mellitus with other specified complication: Secondary | ICD-10-CM

## 2015-12-13 DIAGNOSIS — C787 Secondary malignant neoplasm of liver and intrahepatic bile duct: Secondary | ICD-10-CM | POA: Diagnosis not present

## 2015-12-13 DIAGNOSIS — D630 Anemia in neoplastic disease: Secondary | ICD-10-CM

## 2015-12-13 DIAGNOSIS — Z86711 Personal history of pulmonary embolism: Secondary | ICD-10-CM

## 2015-12-13 DIAGNOSIS — Z86718 Personal history of other venous thrombosis and embolism: Secondary | ICD-10-CM

## 2015-12-13 DIAGNOSIS — Z9049 Acquired absence of other specified parts of digestive tract: Secondary | ICD-10-CM | POA: Insufficient documentation

## 2015-12-13 DIAGNOSIS — K429 Umbilical hernia without obstruction or gangrene: Secondary | ICD-10-CM | POA: Insufficient documentation

## 2015-12-13 DIAGNOSIS — F32A Depression, unspecified: Secondary | ICD-10-CM

## 2015-12-13 DIAGNOSIS — F329 Major depressive disorder, single episode, unspecified: Secondary | ICD-10-CM

## 2015-12-13 LAB — COMPREHENSIVE METABOLIC PANEL
ALBUMIN: 3.6 g/dL (ref 3.5–5.0)
ALK PHOS: 101 U/L (ref 40–150)
ALT: 17 U/L (ref 0–55)
AST: 16 U/L (ref 5–34)
Anion Gap: 8 mEq/L (ref 3–11)
BILIRUBIN TOTAL: 0.51 mg/dL (ref 0.20–1.20)
BUN: 23.6 mg/dL (ref 7.0–26.0)
CO2: 27 meq/L (ref 22–29)
Calcium: 7.9 mg/dL — ABNORMAL LOW (ref 8.4–10.4)
Chloride: 103 mEq/L (ref 98–109)
Creatinine: 1.2 mg/dL (ref 0.7–1.3)
EGFR: 63 mL/min/{1.73_m2} — ABNORMAL LOW (ref >90)
Glucose: 289 mg/dl — ABNORMAL HIGH (ref 70–140)
POTASSIUM: 4.4 meq/L (ref 3.5–5.1)
SODIUM: 137 meq/L (ref 136–145)
TOTAL PROTEIN: 6.3 g/dL — AB (ref 6.4–8.3)

## 2015-12-13 LAB — CBC WITH DIFFERENTIAL/PLATELET
BASO%: 1.1 % (ref 0.0–2.0)
Basophils Absolute: 0.1 10*3/uL (ref 0.0–0.1)
EOS%: 10 % — AB (ref 0.0–7.0)
Eosinophils Absolute: 0.6 10*3/uL — ABNORMAL HIGH (ref 0.0–0.5)
HCT: 35.3 % — ABNORMAL LOW (ref 38.4–49.9)
HEMOGLOBIN: 11.6 g/dL — AB (ref 13.0–17.1)
LYMPH%: 27.5 % (ref 14.0–49.0)
MCH: 31.4 pg (ref 27.2–33.4)
MCHC: 32.8 g/dL (ref 32.0–36.0)
MCV: 95.8 fL (ref 79.3–98.0)
MONO#: 0.4 10*3/uL (ref 0.1–0.9)
MONO%: 6.5 % (ref 0.0–14.0)
NEUT%: 54.9 % (ref 39.0–75.0)
NEUTROS ABS: 3.5 10*3/uL (ref 1.5–6.5)
Platelets: 171 10*3/uL (ref 140–400)
RBC: 3.69 10*6/uL — AB (ref 4.20–5.82)
RDW: 14.6 % (ref 11.0–14.6)
WBC: 6.4 10*3/uL (ref 4.0–10.3)
lymph#: 1.8 10*3/uL (ref 0.9–3.3)

## 2015-12-13 LAB — RETICULOCYTES (CHCC)
Immature Retic Fract: 11.6 % — ABNORMAL HIGH (ref 3.00–10.60)
RBC: 3.58 10*6/uL — ABNORMAL LOW (ref 4.20–5.82)
Retic %: 2.09 % — ABNORMAL HIGH (ref 0.80–1.80)
Retic Ct Abs: 74.82 10*3/uL (ref 34.80–93.90)

## 2015-12-13 LAB — GLUCOSE, CAPILLARY: GLUCOSE-CAPILLARY: 174 mg/dL — AB (ref 65–99)

## 2015-12-13 MED ORDER — FLUDEOXYGLUCOSE F - 18 (FDG) INJECTION
13.5000 | Freq: Once | INTRAVENOUS | Status: AC | PRN
  Administered 2015-12-13: 13.5 via INTRAVENOUS

## 2015-12-13 MED ORDER — DENOSUMAB 120 MG/1.7ML ~~LOC~~ SOLN
120.0000 mg | Freq: Once | SUBCUTANEOUS | Status: AC
  Administered 2015-12-13: 120 mg via SUBCUTANEOUS
  Filled 2015-12-13: qty 1.7

## 2015-12-13 NOTE — Telephone Encounter (Signed)
Gave pt appt for 02/07/16.

## 2015-12-13 NOTE — Patient Instructions (Signed)
Denosumab injection  What is this medicine?  DENOSUMAB (den oh sue mab) slows bone breakdown. Prolia is used to treat osteoporosis in women after menopause and in men. Xgeva is used to prevent bone fractures and other bone problems caused by cancer bone metastases. Xgeva is also used to treat giant cell tumor of the bone.  This medicine may be used for other purposes; ask your health care provider or pharmacist if you have questions.  What should I tell my health care provider before I take this medicine?  They need to know if you have any of these conditions:  -dental disease  -eczema  -infection or history of infections  -kidney disease or on dialysis  -low blood calcium or vitamin D  -malabsorption syndrome  -scheduled to have surgery or tooth extraction  -taking medicine that contains denosumab  -thyroid or parathyroid disease  -an unusual reaction to denosumab, other medicines, foods, dyes, or preservatives  -pregnant or trying to get pregnant  -breast-feeding  How should I use this medicine?  This medicine is for injection under the skin. It is given by a health care professional in a hospital or clinic setting.  If you are getting Prolia, a special MedGuide will be given to you by the pharmacist with each prescription and refill. Be sure to read this information carefully each time.  For Prolia, talk to your pediatrician regarding the use of this medicine in children. Special care may be needed. For Xgeva, talk to your pediatrician regarding the use of this medicine in children. While this drug may be prescribed for children as young as 13 years for selected conditions, precautions do apply.  Overdosage: If you think you have taken too much of this medicine contact a poison control center or emergency room at once.  NOTE: This medicine is only for you. Do not share this medicine with others.  What if I miss a dose?  It is important not to miss your dose. Call your doctor or health care professional if you are  unable to keep an appointment.  What may interact with this medicine?  Do not take this medicine with any of the following medications:  -other medicines containing denosumab  This medicine may also interact with the following medications:  -medicines that suppress the immune system  -medicines that treat cancer  -steroid medicines like prednisone or cortisone  This list may not describe all possible interactions. Give your health care provider a list of all the medicines, herbs, non-prescription drugs, or dietary supplements you use. Also tell them if you smoke, drink alcohol, or use illegal drugs. Some items may interact with your medicine.  What should I watch for while using this medicine?  Visit your doctor or health care professional for regular checks on your progress. Your doctor or health care professional may order blood tests and other tests to see how you are doing.  Call your doctor or health care professional if you get a cold or other infection while receiving this medicine. Do not treat yourself. This medicine may decrease your body's ability to fight infection.  You should make sure you get enough calcium and vitamin D while you are taking this medicine, unless your doctor tells you not to. Discuss the foods you eat and the vitamins you take with your health care professional.  See your dentist regularly. Brush and floss your teeth as directed. Before you have any dental work done, tell your dentist you are receiving this medicine.  Do   not become pregnant while taking this medicine or for 5 months after stopping it. Women should inform their doctor if they wish to become pregnant or think they might be pregnant. There is a potential for serious side effects to an unborn child. Talk to your health care professional or pharmacist for more information.  What side effects may I notice from receiving this medicine?  Side effects that you should report to your doctor or health care professional as soon as  possible:  -allergic reactions like skin rash, itching or hives, swelling of the face, lips, or tongue  -breathing problems  -chest pain  -fast, irregular heartbeat  -feeling faint or lightheaded, falls  -fever, chills, or any other sign of infection  -muscle spasms, tightening, or twitches  -numbness or tingling  -skin blisters or bumps, or is dry, peels, or red  -slow healing or unexplained pain in the mouth or jaw  -unusual bleeding or bruising  Side effects that usually do not require medical attention (Report these to your doctor or health care professional if they continue or are bothersome.):  -muscle pain  -stomach upset, gas  This list may not describe all possible side effects. Call your doctor for medical advice about side effects. You may report side effects to FDA at 1-800-FDA-1088.  Where should I keep my medicine?  This medicine is only given in a clinic, doctor's office, or other health care setting and will not be stored at home.  NOTE: This sheet is a summary. It may not cover all possible information. If you have questions about this medicine, talk to your doctor, pharmacist, or health care provider.      2016, Elsevier/Gold Standard. (2012-06-08 12:37:47)

## 2015-12-13 NOTE — Progress Notes (Addendum)
[place]  Telephone:([phone]) [phone] Fax:(336) [phone]  Clinic Follow Up Note   Patient Care Team: [name]. [name], MD as PCP - General (Internal Medicine) [name], MD as Consulting Physician (Hematology) 12/13/2015   CHIEF COMPLAINTS:  Follow up metastatic lung cancer  Oncology History   Metastatic lung cancer (metastasis from lung to other site)   Staging form: Lung, AJCC 7th Edition     Clinical: Stage IV (T3, N3, M1b) - Unsigned        Metastatic lung cancer (metastasis from lung to other site) ([place])   03/24/2015 Imaging brian MRI 1.9 cm high left frontal lobe mass with minimal surrounding edema   03/24/2015 Imaging CT showed Multiple low-density lesions in the liver worrisome for metastatic disease. Consolidative infiltrate in the left lower lobe with interstitial infiltrate in the left upper lobe. Single enlarged left hilar lymph node.   03/27/2015 Initial Diagnosis Metastatic lung cancer (metastasis from lung to other site)   03/27/2015 Pathology Results Metastatic adenocarcinoma, IHC positive for CK 7, TTF-1 and Napsin A. Negative for CK 20, CD X2, PSA, consistent with lung primary   03/27/2015 Miscellaneous Foundation One test showed (+) EML4-ALK fusion (variant 2), CDKN2A/B loss, SMAD4 R361H.    04/07/2015 Imaging PET scan showed hypermetabolic soft tissue infiltrates in the left lower lobe lung, bilateral mediastinal adenopathy,widespread metastatic disease, including to thoracic, low cervical nodes, liver and bones.   04/10/2015 - 04/10/2015 Radiation Therapy SRS to the brain met    04/27/2015 - 09/08/2015 Chemotherapy Crizotinib 282m bid, stopped due to disease preogression    08/08/2015 - 08/12/2015 [facility]  [facility] showed small hemorrhage. Lovenox was held,  and he had a IVC filter Placed. Lovenox was resumed one month later.    09/09/2015 -  Chemotherapy Alectinib 6038mbid       HISTORY OF PRESENTING ILLNESS (03/29/2015):  [place][age].o. male is here because of newly diagnosed metastatic lung cancer.  He has been in good health until 3 month ago, when he started having intermittent nausea, and gradual weight loss a total of 18 lbs since then, although some are intentional by cutting back diet. He denies any vomiting, abdominal pain or discomfort, or change of his bowel habit.  He presented with syncope, right after he noticed some titch of left face, and rolling over of his eyes. He will [name] the ground and lost consciousness. This was witnessed by his wife and other people, 911 was called and paramedic staff underwent CPR for a total of 30 minutes. The cardiac monitor shows that he was in asystole. [facility] He workup in the ambulance, and did not have any significant in neurology deficit afterwards. He was seen by cardiology in the neurology service, cardiac catheterization was negative for coronary artery disease. He underwent a CT of head which showed a 1.9 cm lesion in the left frontal lobe with surrounding edema. CT of the chest reviewed no PE, but significant infiltrative consolidation in the left lower lobe, it was felt to be aspiration pneumonia. CT of the abdomen reviewed multiple diffuse liver metastasis. He underwent a liver biopsy which showed adenocarcinoma.   He was a discharge to home. He has been doing well overall since hospital discharge. He has some residual pain from the rib fracture from CPR. He denies any cough, dyspnea, or other symptoms. He  has mild fatigue and low appetite, which has been chronic since his gastric bypass surgery in 2011.   CURRENT THERAPY: Alectinib 600 mg twice daily started on 09/11/2015  INTERIM HISTORY: Tevin returns for follow-up and monthly Xgeva injection. He has been doing really well lately, his appetite is very good, and energy level has improved, he is less  depressed and more active than before. He has no difficulty tolerating routine activities. He denies any pain, dyspnea, cough, or other symptoms. He can about 15 pounds in the past 2 months.   MEDICAL HISTORY:  Past Medical History  Diagnosis Date  . Diabetes mellitus without complication ([place])   . H/O gastric bypass     a. ~2011 at [facility].  . Acid reflux   . Seizures ([place]) 03/23/15  . Brain mass 03/24/15    ct head - left frontal lobe mass consistent with a brain metastasis  . Cancer ([place])     mass on lung, brain, sinuses, liver and bones    SURGICAL HISTORY: Past Surgical History  Procedure Laterality Date  . Left heart catheterization with coronary angiogram N/A 03/23/2015    Procedure: LEFT HEART CATHETERIZATION WITH CORONARY ANGIOGRAM;  Surgeon: Burnell Blanks, MD;  Location: [facility] CATH LAB;  Service: Cardiovascular;  Laterality: N/A;  . Gastric bypass  2011  . Liver biopsy  03/27/15  . Appendectomy    . Cholecystectomy    . Right knee osroscopy Right     SOCIAL HISTORY: History   Social History  . Marital Status: Unknown    Spouse Name: N/A  . Number of Children: 2  . Years of Education: N/A   Occupational History  . Barrister's clerk    Social History Main Topics  . Smoking status: Uses cigar   . Smokeless tobacco: Not on file  . Alcohol Use: 0.0 oz/week    0 Standard drinks or equivalent per week     Comment: Occasionally socially - once a week or less  . Drug Use: No  . Sexual Activity: Not on file   Other Topics Concern  . Not on file   Social History Narrative    FAMILY HISTORY: Family History  Problem Relation Age of Onset  . Valvular heart disease Father     H/o pig valve  . Cancer Father     skin cancer   . Sudden death Neg Hx     No family history of sudden cardiac death  . Breast cancer Mother   . Cancer Mother 29    breast cancer   . Cancer Brother 42    base of tongue   . Cancer Maternal Uncle     throat cancer   . Cancer  Maternal Uncle 90    colon cancer, and prostate cancer   . Cancer Cousin     lung cancer     ALLERGIES:  has No Known Allergies.  MEDICATIONS:  Current Outpatient Prescriptions  Medication Sig Dispense Refill  . Alectinib HCl 150 MG CAPS Take 600 mg by mouth 2 (two) times daily. 240 capsule 3  . ALPRAZolam (XANAX) 0.5 MG tablet Take 0.5 mg by mouth 3 (three) times daily as needed.     Marland Kitchen atorvastatin (LIPITOR) 40 MG tablet Take 40 mg by mouth at bedtime. Last Refill 08/22/2014    . blood glucose meter kit and supplies KIT Dispense based on patient and insurance preference. Use up to four times daily as directed. (FOR ICD-9 250.00, 250.01). 1 each  0  . CALCIUM & MAGNESIUM CARBONATES PO Take 500 mg by mouth 2 (two) times daily.    . Calcium Carbonate-Vitamin D (CALCIUM-VITAMIN D) 500-200 MG-UNIT tablet Take by mouth.    . DULoxetine (CYMBALTA) 60 MG capsule Take 60 mg by mouth.    . enoxaparin (LOVENOX) 100 MG/ML injection Inject 1 mL (100 mg total) into the skin daily. 30 Syringe 3  . levETIRAcetam (KEPPRA) 750 MG tablet Take 2 tablets (1,500 mg total) by mouth 2 (two) times daily.    . metFORMIN (GLUCOPHAGE) 1000 MG tablet Take 1,000 mg by mouth 2 (two) times daily with a meal. Last refill 08/23/2014    . mirtazapine (REMERON) 45 MG tablet TAKE 1 TABLET (45 MG TOTAL) BY MOUTH AT BEDTIME. 30 tablet 2  . Multiple Vitamin (MULTI-VITAMINS) TABS Take by mouth.    Marland Kitchen omeprazole (PRILOSEC) 20 MG capsule Take 20 mg by mouth daily. Last Refill 08/22/2014    . ondansetron (ZOFRAN) 8 MG tablet Take 1 tablet (8 mg total) by mouth every 8 (eight) hours as needed for nausea or vomiting. 30 tablet 3  . traZODone (DESYREL) 50 MG tablet Take 50 mg by mouth at bedtime.     No current facility-administered medications for this visit.    REVIEW OF SYSTEMS:   Constitutional: Denies fevers, chills or abnormal night sweats, (+) fatigue  Eyes: Denies blurriness of vision, double vision or watery eyes Ears, nose,  mouth, throat, and face: Denies mucositis or sore throat Respiratory: Denies cough, dyspnea or wheezes Cardiovascular: Denies palpitation, chest discomfort or lower extremity swelling Gastrointestinal:  Mild intermittent nausea, no heartburn or change in bowel habits Skin: Denies abnormal skin rashes Lymphatics: Denies new lymphadenopathy or easy bruising Neurological:Denies numbness, tingling or new weaknesses Behavioral/Psych: Mood is stable, no new changes  All other systems were reviewed with the patient and are negative.  PHYSICAL EXAMINATION: ECOG PERFORMANCE STATUS: 1  Filed Vitals:   12/13/15 1118  BP: 133/64  Pulse: 65  Temp: 98 F (36.7 C)  Resp: 20   Filed Weights   12/13/15 1118  Weight: 280 lb 11.2 oz (127.325 kg)    GENERAL:alert, no distress and comfortable SKIN: skin color, texture, turgor are normal, no rashes or significant lesions EYES: normal, conjunctiva are pink and non-injected, sclera clear OROPHARYNX:no exudate, no erythema and lips, buccal mucosa, and tongue normal  NECK: supple, thyroid normal size, non-tender, without nodularity LYMPH:  no palpable lymphadenopathy in the cervical, axillary or inguinal LUNGS: clear to auscultation and percussion with normal breathing effort HEART: regular rate & rhythm and no murmurs and no lower extremity edema ABDOMEN:abdomen soft, non-tender and normal bowel sounds Musculoskeletal:no cyanosis of digits and no clubbing  PSYCH: alert & oriented x 3 with fluent speech NEURO: no focal motor/sensory deficits  LABORATORY DATA:  I have reviewed the data as listed CBC Latest Ref Rng 12/13/2015 11/14/2015 10/06/2015  WBC 4.0 - 10.3 10e3/uL 6.4 7.2 5.6  Hemoglobin 13.0 - 17.1 g/dL 11.6(L) 11.2(L) 11.0(L)  Hematocrit 38.4 - 49.9 % 35.3(L) 34.6(L) 34.0(L)  Platelets 140 - 400 10e3/uL 171 176 148    CMP Latest Ref Rng 12/13/2015 11/14/2015 10/06/2015  Glucose 70 - 140 mg/dl 289(H) 165(H) 102  BUN 7.0 - 26.0 mg/dL  23.6 22.5 18.7  Creatinine 0.7 - 1.3 mg/dL 1.2 1.1 0.9  Sodium 136 - 145 mEq/L 137 140 143  Potassium 3.5 - 5.1 mEq/L 4.4 4.6 4.2  Chloride 96 - 112 mmol/L - - -  CO2 22 -  29 mEq/L _0 Calcium 8.4 - 10.4 mg/dL 7.9(L) 9.1 7.9(L)  Total Protein 6.4 - 8.3 g/dL 6.3(L) 6.3(L) 5.5(L)  Total Bilirubin 0.20 - 1.20 mg/dL 0.51 0.70 0.44  Alkaline Phos 40 - 150 U/L 101 100 70  AST 5 - 34 U/L _1 ALT 0 - 55 U/L _2 PATHOLOGY REPORT: Liver, needle/core biopsy, right lobe 03/27/2015  - METASTATIC ADENOCARCINOMA, SEE COMMENT. Microscopic Comment The adenocarcinoma demonstrates the following immunophenotype Cytokeratin 7 - strong diffuse expression. Cytokeratin 20 - negative expression. TTF-1 - patchy moderate strong expression. Napsin A - strong diffuse expression. CDX-2 - negative expression. PSA - negative expression. Overall, the morphology and immunophenotype are that of metastatic adenocarcinoma, primary to lung. There is tumor available for ancillary tumor testing. The case is reviewed with Dr. Lyndon Code who concurs. The case was discussed with Dr Burr Medico on 03/29/2015. (CRR:gt:ecj, 03/29/15)  RADIOGRAPHIC STUDIES: I have personally reviewed the radiological images as listed and agreed with the findings in the report.  PET 12/13/2015 IMPRESSION: 1. Complete interval metabolic response. No residual hypermetabolic metastatic disease. 2. No residual hypermetabolism within the primary lung malignancy in the left lower lobe, which has decreased in size. 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 Resolved left infrahilar nodal hypermetabolism. 3. Liver metastases have all decreased in size and demonstrate no residual hypermetabolism. 4. Increased sclerosis throughout the widespread sclerotic osseous metastases with no residual osseous hypermetabolism, in keeping with treatment effect.   Brain MRI with and without contrast  10/06/2015 IMPRESSION: Hemorrhagic metastasis in the left posterior frontal lobe may be slightly larger in shows slightly more prominent enhancement. This could be due to 3 Tesla scanning on the current study. Moderate vasogenic edema stable. Close follow-up is suggested  Sclerotic metastasis right clivus is unchanged. No new lesions.   ASSESSMENT & PLAN:  59 year old Caucasian male with minimal smoking history, obesity status post gastric bypass surgery, diabetes, presented with cardiac arrest and presumed seizure. Imaging study showed left lower lobe infiltrative consultation, left hilar adenopathy, diffuse liver metastasis and left front lobe brain metastasis.  1. 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 Images were reviewed in person. -We discussed that his cancer is incurable at this stage, but treatable. We now have a 3 ALK inhibitors available for this type of lung cancer, with very good response rate and significantly increase patient's survival. -I discussed his recent restaging PET scan findings. It showed complete response to Alectinib, he is tolerating Alectinib very well, and he is very excited about the excellent response -continue alectinib   2. Brain mets -s/p SRS, follow-up with radiation oncology -His restaging brain MRI showed good response to treatment -Alectinib has some CNS penetration, hopefully will control his brain mets also    3. Bone mets -Xgeva, continue every 4 weeks.  -He is taking calcium and vitamin D.  4. history of cardiac arrest -Likely related to his breathing metastasis and seizure -Cardiac cath was negative for coronary artery disease  5. Seizure, secondary to brain metastasis -Continue Keppra, his does was increased in 10/2015  -He will follow up with neurologist in  [place]. He has not driven since the seizure.  6.  diabetes  -Continue follow-up with primary care physician.   7. DVT and Pulmonary embolism, diagnosed on 06/15/2015 -Probably related to  his underline malignancy -We previously discussed the option of anticoagulation, which include Lovenox and Coumadin. Low molecular weight heparin (such as Lovenox) works better than coumadin to prevent recurrent thrombosis in cancer patients [facility] [phone]), and I recommend him continue lovenox indefinitely unless there is a contraindication such as recurrent bleeding. -he is on low dose of lovenox 175m daily now, due to the concern of tumor hemorrhage in brain. This was the concencus from CNS tumor board discussion.   9. Depression -[facility] -continue mirtazapine 488mat bedtime.   10. Anemia -mild and stable, developed since he started Alectinib, likely related  -will follow up closely  11. Hypocalcemia -secondary to XgDelton See-I asked him to increase calcium to three tab a day -Hold Xgeva if correct calcium<8  Follow-up -Continue Alectinib  -Xgeva today and monthly, hold if calcium<8  -RTC in two month    All questions were answered. The patient knows to call the clinic with any problems, questions or concerns. I spent 30 minutes counseling the patient face to face. The total time spent in the appointment was 35 minutes and more than 50% was on counseling.     FeTruitt MerleMD 12/13/2015    11:18 AM

## 2015-12-16 ENCOUNTER — Encounter: Payer: Self-pay | Admitting: Hematology

## 2015-12-16 DIAGNOSIS — D630 Anemia in neoplastic disease: Secondary | ICD-10-CM | POA: Insufficient documentation

## 2015-12-21 ENCOUNTER — Other Ambulatory Visit: Payer: Self-pay | Admitting: Radiation Therapy

## 2015-12-21 DIAGNOSIS — C7931 Secondary malignant neoplasm of brain: Secondary | ICD-10-CM

## 2015-12-21 DIAGNOSIS — C7949 Secondary malignant neoplasm of other parts of nervous system: Principal | ICD-10-CM

## 2015-12-26 ENCOUNTER — Telehealth: Payer: Self-pay | Admitting: *Deleted

## 2015-12-26 NOTE — Telephone Encounter (Signed)
XXXX 

## 2016-01-01 ENCOUNTER — Telehealth: Payer: Self-pay | Admitting: *Deleted

## 2016-01-01 NOTE — Telephone Encounter (Signed)
Received call from wife Ivin Booty wanting to know re:  Pt has f/u appt with Dr. Isidore Moos on Mon 01/08/16.  Ivin Booty wanted to know if pt could receive Xgeva injection on same day Monday instead of Wed 01/10/16  Due to pt lives out of town.  Message to Dr. Burr Medico. [facility]   Phone    [phone].

## 2016-01-02 ENCOUNTER — Other Ambulatory Visit: Payer: Self-pay | Admitting: Hematology

## 2016-01-02 ENCOUNTER — Telehealth: Payer: Self-pay | Admitting: *Deleted

## 2016-01-02 ENCOUNTER — Encounter: Payer: Self-pay | Admitting: Hematology

## 2016-01-02 NOTE — Telephone Encounter (Signed)
Received denial letter from [facility] for enoxaparin.  Dr Burr Medico wrote an appeal letter & faxed this along with last ov note notes to document reason for enoxaparin to [phone].

## 2016-01-02 NOTE — Progress Notes (Signed)
I completed form already started for prior auth for lovenox.-- [employer]

## 2016-01-03 ENCOUNTER — Other Ambulatory Visit: Payer: Self-pay | Admitting: *Deleted

## 2016-01-03 DIAGNOSIS — C3492 Malignant neoplasm of unspecified part of left bronchus or lung: Secondary | ICD-10-CM

## 2016-01-03 MED ORDER — ALECTINIB HCL 150 MG PO CAPS: 600.0000 mg | ORAL_CAPSULE | Freq: Two times a day (BID) | ORAL | Status: DC

## 2016-01-04 ENCOUNTER — Encounter: Payer: Self-pay | Admitting: Hematology

## 2016-01-04 NOTE — Progress Notes (Signed)
Per [employer] lovenox was approved 01/02/16-01/01/17    [place] [id number]. I sent to medical records.

## 2016-01-05 ENCOUNTER — Encounter: Payer: Self-pay | Admitting: Radiation Oncology

## 2016-01-05 ENCOUNTER — Other Ambulatory Visit: Payer: Self-pay | Admitting: Radiation Oncology

## 2016-01-05 ENCOUNTER — Telehealth: Payer: Self-pay | Admitting: Oncology

## 2016-01-05 ENCOUNTER — Ambulatory Visit
Admission: RE | Admit: 2016-01-05 | Discharge: 2016-01-05 | Disposition: A | Discharge status: Home / Self Care | Payer: Commercial Managed Care - PPO | Source: Ambulatory Visit | Attending: Radiation Oncology | Admitting: Radiation Oncology

## 2016-01-05 ENCOUNTER — Telehealth: Payer: Self-pay | Admitting: *Deleted

## 2016-01-05 DIAGNOSIS — C7949 Secondary malignant neoplasm of other parts of nervous system: Principal | ICD-10-CM

## 2016-01-05 DIAGNOSIS — C7931 Secondary malignant neoplasm of brain: Secondary | ICD-10-CM

## 2016-01-05 MED ORDER — DEXAMETHASONE 4 MG PO TABS: 4.0000 mg | ORAL_TABLET | Freq: Two times a day (BID) | ORAL | Status: DC

## 2016-01-05 MED ORDER — GADOBENATE DIMEGLUMINE 529 MG/ML IV SOLN
20.0000 mL | Freq: Once | INTRAVENOUS | Status: AC | PRN
  Administered 2016-01-05: 20 mL via INTRAVENOUS

## 2016-01-05 NOTE — Telephone Encounter (Signed)
[place] called and said to send his prescription to [facility] on [address] instead of [facility] as they are closed.  Notified Dr. Lisbeth Renshaw.

## 2016-01-05 NOTE — Telephone Encounter (Signed)
Gave MRI results to Dr. Lisbeth Renshaw, patient stated he is having right arm and lower extremity  Weakness, this is new, per Dr. Lisbeth Renshaw  Patient to take '4mg'$  decadron tablets (2 tabs) tonight then 1 tablet '4mg'$  BID over the weekend, and follow up with Dr. Isidore Moos Monday , patient gave verbal understanding will pick up rx at [facility], 4:00 PM

## 2016-01-05 NOTE — Progress Notes (Signed)
We received a call regarding this patient's recent MRI scan from radiology which showed an enlarging tumor with some significant edema. The patient reportedly has been having some right-sided weakness. Decadron has been called in for the patient to begin over the weekend and he already has been scheduled to see Dr. Isidore Moos on 01/08/2016 The patient knows to call the on-call physician if there are any significant difficulties over the weekend.

## 2016-01-08 ENCOUNTER — Ambulatory Visit (HOSPITAL_BASED_OUTPATIENT_CLINIC_OR_DEPARTMENT_OTHER): Payer: Commercial Managed Care - PPO

## 2016-01-08 ENCOUNTER — Other Ambulatory Visit: Payer: Self-pay | Admitting: Radiation Therapy

## 2016-01-08 ENCOUNTER — Ambulatory Visit
Admission: RE | Admit: 2016-01-08 | Discharge: 2016-01-08 | Disposition: A | Discharge status: Home / Self Care | Payer: Commercial Managed Care - PPO | Source: Ambulatory Visit | Attending: Radiation Oncology | Admitting: Radiation Oncology

## 2016-01-08 ENCOUNTER — Other Ambulatory Visit (HOSPITAL_BASED_OUTPATIENT_CLINIC_OR_DEPARTMENT_OTHER): Payer: Commercial Managed Care - PPO

## 2016-01-08 VITALS — BP 130/66 | HR 58 | Temp 98.3°F

## 2016-01-08 VITALS — BP 138/72 | HR 64 | Temp 98.0°F | Resp 20 | Wt 277.7 lb

## 2016-01-08 DIAGNOSIS — C7951 Secondary malignant neoplasm of bone: Secondary | ICD-10-CM

## 2016-01-08 DIAGNOSIS — C3492 Malignant neoplasm of unspecified part of left bronchus or lung: Secondary | ICD-10-CM

## 2016-01-08 DIAGNOSIS — C7931 Secondary malignant neoplasm of brain: Secondary | ICD-10-CM

## 2016-01-08 LAB — COMPREHENSIVE METABOLIC PANEL
ALBUMIN: 4 g/dL (ref 3.5–5.0)
ALT: 21 U/L (ref 0–55)
ANION GAP: 10 meq/L (ref 3–11)
AST: 15 U/L (ref 5–34)
Alkaline Phosphatase: 90 U/L (ref 40–150)
BILIRUBIN TOTAL: 0.54 mg/dL (ref 0.20–1.20)
BUN: 27.1 mg/dL — ABNORMAL HIGH (ref 7.0–26.0)
CALCIUM: 9.5 mg/dL (ref 8.4–10.4)
CO2: 24 meq/L (ref 22–29)
CREATININE: 1.3 mg/dL (ref 0.7–1.3)
Chloride: 103 mEq/L (ref 98–109)
EGFR: 59 mL/min/{1.73_m2} — ABNORMAL LOW (ref >90)
Glucose: 243 mg/dl — ABNORMAL HIGH (ref 70–140)
Potassium: 4.3 mEq/L (ref 3.5–5.1)
Sodium: 138 mEq/L (ref 136–145)
TOTAL PROTEIN: 7.1 g/dL (ref 6.4–8.3)

## 2016-01-08 LAB — CBC & DIFF AND RETIC
BASO%: 0.2 % (ref 0.0–2.0)
Basophils Absolute: 0 10*3/uL (ref 0.0–0.1)
EOS%: 0.1 % (ref 0.0–7.0)
Eosinophils Absolute: 0 10*3/uL (ref 0.0–0.5)
HCT: 37.8 % — ABNORMAL LOW (ref 38.4–49.9)
HGB: 12.6 g/dL — ABNORMAL LOW (ref 13.0–17.1)
Immature Retic Fract: 15.9 % — ABNORMAL HIGH (ref 3.00–10.60)
LYMPH#: 1.7 10*3/uL (ref 0.9–3.3)
LYMPH%: 17.1 % (ref 14.0–49.0)
MCH: 32.1 pg (ref 27.2–33.4)
MCHC: 33.3 g/dL (ref 32.0–36.0)
MCV: 96.2 fL (ref 79.3–98.0)
MONO#: 0.6 10*3/uL (ref 0.1–0.9)
MONO%: 5.8 % (ref 0.0–14.0)
NEUT#: 7.7 10*3/uL — ABNORMAL HIGH (ref 1.5–6.5)
NEUT%: 76.8 % — AB (ref 39.0–75.0)
PLATELETS: 202 10*3/uL (ref 140–400)
RBC: 3.93 10*6/uL — AB (ref 4.20–5.82)
RDW: 13.4 % (ref 11.0–14.6)
RETIC CT ABS: 99.82 10*3/uL — AB (ref 34.80–93.90)
Retic %: 2.54 % — ABNORMAL HIGH (ref 0.80–1.80)
WBC: 10.1 10*3/uL (ref 4.0–10.3)

## 2016-01-08 MED ORDER — PENTOXIFYLLINE ER 400 MG PO TBCR: EXTENDED_RELEASE_TABLET | ORAL | Status: DC

## 2016-01-08 MED ORDER — DENOSUMAB 120 MG/1.7ML ~~LOC~~ SOLN
120.0000 mg | Freq: Once | SUBCUTANEOUS | Status: AC
  Administered 2016-01-08: 120 mg via SUBCUTANEOUS
  Filled 2016-01-08: qty 1.7

## 2016-01-08 MED ORDER — VITAMIN E 180 MG (400 UNIT) PO CAPS: ORAL_CAPSULE | ORAL | Status: DC

## 2016-01-08 MED ORDER — DEXAMETHASONE 4 MG PO TABS: ORAL_TABLET | ORAL | Status: DC

## 2016-01-08 NOTE — Progress Notes (Signed)
PAIN: He is currently in no pain.  NEURO: Pt alert & oriented x 3 with fluent speech, upper and lower extremities equally strong. Pt reports right sides weakness. Pt reports positive for visual blurring., PERRLA, Denies auditory abnormalities. Pt presenting appropriate quality, quantity and organization of sentences. Reports he has a difficult time thinking of words to express what he is thinking at times. Pt denies headaches. OTHER: Pt complains of loss of sleep. Decadron? Yes.   Started Friday 01/05/16 '4mg'$  bid.  Mouth moist, no exudate noted.  Diabetic? Yes.  Pt reports patient does not check sugars, encouraged to check home sugars. BP 138/72 mmHg  Pulse 64  Temp(Src) 98 F (36.7 C) (Oral)  Resp 20  Wt 277 lb 11.2 oz (125.964 kg)  SpO2 100% Wt Readings from Last 3 Encounters:  01/08/16 277 lb 11.2 oz (125.964 kg)  12/13/15 280 lb 11.2 oz (127.325 kg)  11/14/15 275 lb 12.8 oz (125.102 kg)

## 2016-01-08 NOTE — Progress Notes (Addendum)
Radiation Oncology         [phone]) [phone] ________________________________  Name: Walter Wilkins. 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     Beams/energy:  SRS using 4 Rapid Arc VMAT Beams / 6MV FFF  Narrative:  The patient returns today for routine follow-up.  Denies pain. Denies new neurologic issues other than right sided weakness of the UE and LE which resolved within a day of starting Decadron 4 mg BID this weekend.    [facility] Spirits/mood are better.  Discussed recent Brain MRI at [facility] tumor board this am. Shows no new lesions, but treated lesion reveals increased size and surrounding edema. Differential dx includes Radiation Necrosis versus tumor progression.  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    PET of body indicated CR to systemic therapy.     ALLERGIES:  has No Known Allergies.  Meds: Current Outpatient Prescriptions  Medication Sig Dispense Refill  . Alectinib HCl 150 MG CAPS Take 600 mg by mouth 2 (two) times daily. 240 capsule 3  . ALPRAZolam (XANAX) 0.5 MG tablet Take 0.5 mg by mouth 3 (three) times daily as needed.     .Marland Kitchenatorvastatin (LIPITOR) 40 MG tablet Take 40 mg by mouth at bedtime. Last Refill 08/22/2014    . blood glucose meter kit and supplies KIT Dispense based on patient and insurance preference. Use up to four times daily as directed. (FOR ICD-9 250.00, 250.01). 1 each 0  . CALCIUM & MAGNESIUM CARBONATES PO Take 500 mg by mouth 2 (two) times daily.    . Calcium Carbonate-Vitamin D (CALCIUM-VITAMIN D) 500-200 MG-UNIT tablet Take by mouth.    . dexamethasone  (DECADRON) 4 MG tablet Take 446mBID with food, then taper as directed per Dr. SqPearlie Oysternstructions 60 tablet 0  . DULoxetine (CYMBALTA) 60 MG capsule Take 60 mg by mouth.    . enoxaparin (LOVENOX) 100 MG/ML injection Inject 1 mL (100 mg total) into the skin daily. 30 Syringe 3  . levETIRAcetam (KEPPRA) 750 MG tablet Take 2 tablets (1,500 mg total) by mouth 2 (two) times daily.    . metFORMIN (GLUCOPHAGE) 1000 MG tablet Take 1,000 mg by mouth 2 (two) times daily with a meal. Last refill 08/23/2014    . mirtazapine (REMERON) 45 MG tablet TAKE 1 TABLET (45 MG TOTAL) BY MOUTH AT BEDTIME. 30 tablet 2  . Multiple Vitamin (MULTI-VITAMINS) TABS Take by mouth.    . Marland Kitchenmeprazole (PRILOSEC) 20 MG capsule Take 20 mg by mouth daily. Last Refill 08/22/2014    . ondansetron (ZOFRAN) 8 MG tablet Take 1 tablet (8 mg total) by mouth every 8 (eight) hours as needed for nausea or vomiting. 30 tablet 3  . pentoxifylline (TRENTAL) 400 MG CR tablet Take 40053mab daily x 1 week, then 400m30mD; take with food 60 tablet 5  . traZODone (DESYREL) 50 MG tablet Take 50 mg by mouth at bedtime.    . vitamin E 400 UNIT capsule Take 400 IU daily x 1 week, then 400 IU BID 60 capsule 5   No current  facility-administered medications for this encounter.    Physical Findings: The patient is in no acute distress. Patient is alert and oriented.  weight is 277 lb 11.2 oz (125.964 kg). His oral temperature is 98 F (36.7 C). His blood pressure is 138/72 and his pulse is 64. His respiration is 20 and oxygen saturation is 100%. . General: Alert and oriented, in no acute distress HEENT: Head is normocephalic. Extraocular movements are intact. Oropharynx is clear with no thrush.  Heart: Regular in rate and rhythm. Audible murmur, patient reports he is aware of it and it is not new Chest: Clear to auscultation bilaterally, with no rhonchi, wheezes, or rales. Abdomen: Soft, nontender, nondistended, with no rigidity or guarding. Extremities:  mild ankle edema b/l Musculoskeletal: slight weakness in right hip flexion. Otherwise symmetric strength and muscle tone throughout.   Neurologic: Cranial nerves II through XII are grossly intact. No obvious focalities. Speech is fluent. Coordination is intact. Strength is intact and coordination is intact.  Sensation intact Psychiatric: Judgment and insight are intact. Affect is appropriate.  Lab Findings: Lab Results  Component Value Date   WBC 6.4 12/13/2015   HGB 11.6* 12/13/2015   HCT 35.3* 12/13/2015   MCV 95.8 12/13/2015   PLT 171 12/13/2015    Radiographic Findings: Mr Jeri Cos NM Contrast  01/05/2016  CLINICAL DATA:  Metastatic lung cancer. SRS to brain metastasis in 03/2015. EXAM: MRI HEAD WITHOUT AND WITH CONTRAST TECHNIQUE: Multiplanar, multiecho pulse sequences of the brain and surrounding structures were obtained without and with intravenous contrast. CONTRAST:  60m MULTIHANCE GADOBENATE DIMEGLUMINE 529 MG/ML IV SOLN COMPARISON:  10/06/2015 FINDINGS: There is no acute infarct or extra-axial fluid collection. Small foci of T2 hyperintensity scattered throughout both cerebral hemispheres are unchanged and nonspecific. Cerebral volume is within normal limits for age. There is no hydrocephalus. Irregular, peripherally enhancing lesion in the posterior aspect of the left superior frontal gyrus with some chronic blood products has increased in size, measuring 3.3 x 2.3 x 2.4 cm (series 10, image 151, previously 1.8 x 1.8 x 1.7 cm). There is extensive surrounding vasogenic edema which has greatly increased from the prior study. This results in mild regional sulcal effacement and trace rightward midline shift. No new enhancing brain lesions are identified. Sclerotic metastasis in the clivus is unchanged. Orbits are unremarkable. No significant inflammatory changes are seen in the paranasal sinuses or mastoid air cells. Major intracranial vascular flow voids are preserved. IMPRESSION: 1.  Increased size of enhancing left frontal lesion with greatly increased surrounding edema resulting in only trace midline shift. The appearance is suspicious for worsening post treatment change/radiation necrosis, however enlarging metastasis is also possible. 2. No evidence of new intracranial metastases. These results will be called to the ordering clinician or representative by the Radiologist Assistant, and communication documented in the PACS or zVision Dashboard. Electronically Signed   By: ALogan BoresM.D.   On: 01/05/2016 15:35   Nm Pet Image Restag (ps) Skull Base To Thigh  12/13/2015  CLINICAL DATA:  Subsequent treatment strategy for widely metastatic lung adenocarcinoma diagnosed in April 2016. EXAM: NUCLEAR MEDICINE PET SKULL BASE TO THIGH TECHNIQUE: 13.5 mCi F-18 FDG was injected intravenously. Full-ring PET imaging was performed from the skull base to thigh after the radiotracer. CT data was obtained and used for attenuation correction and anatomic localization. FASTING BLOOD GLUCOSE:  Value: 174 mg/dl COMPARISON:  09/01/2015 PET-CT. FINDINGS: NECK No hypermetabolic lymph nodes in the neck. CHEST No hypermetabolic axillary, mediastinal or hilar nodes.  The previously described hypermetabolic left infrahilar node demonstrates no residual hypermetabolism. The previously described left pleural effusion has resolved. The previously described hypermetabolic 2.0 cm spiculated left lower lobe pulmonary nodule is decreased in size, currently 1.0 cm (series 8/image 47), with no residual metabolism. There is stable focal bandlike subpleural consolidation and ground-glass opacity in the medial right lower lobe adjacent to bulky right thoracic spine osteophytes with associated mild hypermetabolism, most in keeping with focal osteophyte related fibrosis. The previously described interlobular septal thickening and peribronchovascular interstitial beading has resolved. No acute consolidative airspace disease or  new significant pulmonary nodules. ABDOMEN/PELVIS There is no residual hypermetabolism within the liver metastases, which have all decreased in size. For example, the 3.3 cm anterior liver dome hypodense liver mass has decreased in size to 1.6 cm (series 4/image 98) and is non hypermetabolic. The previously described 2.1 cm anterior lateral segment left liver lobe hypodense liver mass is decreased in size to 1.0 cm (series 4/image 108) and is non hypermetabolic. The previously described 1.5 cm lateral segment left liver lobe hypodense liver mass is decreased in size to 0.6 cm (4/107) and is non hypermetabolic. No abnormal hypermetabolic activity within the liver, pancreas, adrenal glands, or spleen. No hypermetabolic lymph nodes in the abdomen or pelvis. Status post cholecystectomy. Stable collapsed bladder with likely diffuse bladder wall thickening and mild prostatomegaly. Stable well-positioned IVC filter below the level of the renal veins. Stable postsurgical changes in the stomach. No dilated small bowel loops. Moderate stool throughout the colon. Stable large umbilical hernia containing a portion of the mid transverse colon, with no colonic wall thickening, pericolonic fat stranding, pneumatosis or focal colonic caliber transition. Stable moderate fat containing left inguinal hernia. SKELETON There is no residual hypermetabolism within the sclerotic osseous metastases visualized throughout the spine, bilateral ribs, bilateral iliac bones and proximal appendicular skeleton, with increased sclerosis throughout the osseous metastases, in keeping with treatment effect. IMPRESSION: 1. Complete interval metabolic response. No residual hypermetabolic metastatic disease. 2. No residual hypermetabolism within the primary lung malignancy in the left lower lobe, which has decreased in size. 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 Resolved left infrahilar nodal hypermetabolism. 3. Liver  metastases have all decreased in size and demonstrate no residual hypermetabolism. 4. Increased sclerosis throughout the widespread sclerotic osseous metastases with no residual osseous hypermetabolism, in keeping with treatment effect. Electronically Signed   By: Ilona Sorrel M.D.   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  PET of body indicated CR to systemic therapy. Right sided weakness almost 100% resolved with 56m Dexamethasone BID x 3 days  The followings plans were discussed at tumor board and with the family and the patient:  1) These are two new medications prescribed are    ICD-9-CM ICD-10-CM   1. Brain metastasis (HCC) 198.3 C79.31 vitamin E 400 UNIT capsule     pentoxifylline (TRENTAL) 400 MG CR tablet     dexamethasone (DECADRON) 4 MG tablet   I also refilled your Dexamethasone to last you longer.  2) For the Dexamethasone, take 423mtwice daily for 3 more weeks.   On Feb 6th, start taking 92m93mnce a day with food.   On March 6th, start taking half a tablet (2 mg) daily with food.   Continue this until you see Dr. SquIsidore Moos March.  3) I will order a PET of your brain to be done soon.  4) I will order an  MRI of your brain and followup to be done in 2 months.  5) I will discuss with Dr. Burr Medico if Avastin is warranted as a new treatment, as well.  6) Check glucose levels regularly and contact PCP if they become higher than usual   _____________________________________   Eppie Gibson, MD

## 2016-01-08 NOTE — Patient Instructions (Signed)
    ICD-9-CM ICD-10-CM   1. Brain metastasis (HCC) 198.3 C79.31 vitamin E 400 UNIT capsule     pentoxifylline (TRENTAL) 400 MG CR tablet    1) These are two new medications prescribed above.  I also refilled your Dexamethasone to last you longer.  2) For the Dexamethasone, take '4mg'$  twice daily for 3 more weeks.   On Feb 6th, start taking '4mg'$  once a day with food.   On March 6th, start taking half a tablet (2 mg) daily with food.   Continue this until you see Dr. Isidore Moos in March.  3) I will order a PET of your brain to be done soon.  4) I will order an MRI of your brain and followup to be done in 2 months.  5) I will discuss with Dr. Burr Medico if Avastin is warranted as a new treatment, as well.

## 2016-01-10 ENCOUNTER — Ambulatory Visit: Payer: Commercial Managed Care - PPO

## 2016-01-18 ENCOUNTER — Encounter (HOSPITAL_COMMUNITY)
Admission: RE | Admit: 2016-01-18 | Discharge: 2016-01-18 | Disposition: A | Discharge status: Home / Self Care | Payer: Commercial Managed Care - PPO | Source: Ambulatory Visit | Attending: Radiation Oncology | Admitting: Radiation Oncology

## 2016-01-18 DIAGNOSIS — C7931 Secondary malignant neoplasm of brain: Secondary | ICD-10-CM | POA: Insufficient documentation

## 2016-01-18 MED ORDER — FLUDEOXYGLUCOSE F - 18 (FDG) INJECTION
14.2700 | Freq: Once | INTRAVENOUS | Status: AC | PRN
  Administered 2016-01-18: 14.27 via INTRAVENOUS

## 2016-01-22 ENCOUNTER — Ambulatory Visit: Payer: Commercial Managed Care - PPO | Admitting: Neurology

## 2016-02-05 ENCOUNTER — Telehealth: Payer: Self-pay

## 2016-02-05 NOTE — Telephone Encounter (Signed)
Walter Wilkins called today asking for a UB04 form to be emailed to him. I told him that I felt like this was a financial issue, and had no access to this form. I called and spoke with Walter Wilkins in financial here in Radiation Oncology. She is aware of this form and states it can only come from billing. She will call Walter Wilkins and inform him of this, and help him as much as he can.

## 2016-02-07 ENCOUNTER — Encounter: Payer: Self-pay | Admitting: Hematology

## 2016-02-07 ENCOUNTER — Ambulatory Visit (HOSPITAL_BASED_OUTPATIENT_CLINIC_OR_DEPARTMENT_OTHER): Payer: Commercial Managed Care - PPO

## 2016-02-07 ENCOUNTER — Telehealth: Payer: Self-pay | Admitting: Hematology

## 2016-02-07 ENCOUNTER — Telehealth: Payer: Self-pay | Admitting: *Deleted

## 2016-02-07 ENCOUNTER — Other Ambulatory Visit (HOSPITAL_BASED_OUTPATIENT_CLINIC_OR_DEPARTMENT_OTHER): Payer: Commercial Managed Care - PPO

## 2016-02-07 ENCOUNTER — Encounter: Payer: Self-pay | Admitting: *Deleted

## 2016-02-07 ENCOUNTER — Ambulatory Visit (HOSPITAL_BASED_OUTPATIENT_CLINIC_OR_DEPARTMENT_OTHER): Payer: Commercial Managed Care - PPO | Admitting: Hematology

## 2016-02-07 VITALS — BP 124/77 | HR 65 | Temp 98.0°F | Resp 18 | Ht 72.0 in | Wt 277.0 lb

## 2016-02-07 DIAGNOSIS — D649 Anemia, unspecified: Secondary | ICD-10-CM

## 2016-02-07 DIAGNOSIS — E119 Type 2 diabetes mellitus without complications: Secondary | ICD-10-CM

## 2016-02-07 DIAGNOSIS — C3492 Malignant neoplasm of unspecified part of left bronchus or lung: Secondary | ICD-10-CM

## 2016-02-07 DIAGNOSIS — R569 Unspecified convulsions: Secondary | ICD-10-CM

## 2016-02-07 DIAGNOSIS — C7951 Secondary malignant neoplasm of bone: Secondary | ICD-10-CM

## 2016-02-07 DIAGNOSIS — Z86718 Personal history of other venous thrombosis and embolism: Secondary | ICD-10-CM

## 2016-02-07 DIAGNOSIS — Z8674 Personal history of sudden cardiac arrest: Secondary | ICD-10-CM

## 2016-02-07 DIAGNOSIS — C787 Secondary malignant neoplasm of liver and intrahepatic bile duct: Secondary | ICD-10-CM

## 2016-02-07 DIAGNOSIS — C3432 Malignant neoplasm of lower lobe, left bronchus or lung: Secondary | ICD-10-CM | POA: Diagnosis not present

## 2016-02-07 DIAGNOSIS — C7931 Secondary malignant neoplasm of brain: Secondary | ICD-10-CM

## 2016-02-07 DIAGNOSIS — F329 Major depressive disorder, single episode, unspecified: Secondary | ICD-10-CM

## 2016-02-07 DIAGNOSIS — C799 Secondary malignant neoplasm of unspecified site: Secondary | ICD-10-CM

## 2016-02-07 DIAGNOSIS — Z86711 Personal history of pulmonary embolism: Secondary | ICD-10-CM

## 2016-02-07 DIAGNOSIS — C3490 Malignant neoplasm of unspecified part of unspecified bronchus or lung: Secondary | ICD-10-CM

## 2016-02-07 LAB — COMPREHENSIVE METABOLIC PANEL
ALBUMIN: 3.3 g/dL — AB (ref 3.5–5.0)
ALK PHOS: 68 U/L (ref 40–150)
ALT: 31 U/L (ref 0–55)
AST: 17 U/L (ref 5–34)
Anion Gap: 10 mEq/L (ref 3–11)
BILIRUBIN TOTAL: 0.63 mg/dL (ref 0.20–1.20)
BUN: 22.4 mg/dL (ref 7.0–26.0)
CALCIUM: 8.4 mg/dL (ref 8.4–10.4)
CO2: 25 mEq/L (ref 22–29)
CREATININE: 1.2 mg/dL (ref 0.7–1.3)
Chloride: 103 mEq/L (ref 98–109)
EGFR: 62 mL/min/{1.73_m2} — ABNORMAL LOW (ref >90)
GLUCOSE: 205 mg/dL — AB (ref 70–140)
POTASSIUM: 4.6 meq/L (ref 3.5–5.1)
SODIUM: 138 meq/L (ref 136–145)
TOTAL PROTEIN: 6.1 g/dL — AB (ref 6.4–8.3)

## 2016-02-07 LAB — CBC & DIFF AND RETIC
BASO%: 0.2 % (ref 0.0–2.0)
BASOS ABS: 0 10*3/uL (ref 0.0–0.1)
EOS ABS: 0.1 10*3/uL (ref 0.0–0.5)
EOS%: 1.1 % (ref 0.0–7.0)
HEMATOCRIT: 38 % — AB (ref 38.4–49.9)
HEMOGLOBIN: 12.5 g/dL — AB (ref 13.0–17.1)
IMMATURE RETIC FRACT: 17.1 % — AB (ref 3.00–10.60)
LYMPH%: 20.6 % (ref 14.0–49.0)
MCH: 31.5 pg (ref 27.2–33.4)
MCHC: 32.9 g/dL (ref 32.0–36.0)
MCV: 95.7 fL (ref 79.3–98.0)
MONO#: 0.5 10*3/uL (ref 0.1–0.9)
MONO%: 5 % (ref 0.0–14.0)
NEUT#: 6.9 10*3/uL — ABNORMAL HIGH (ref 1.5–6.5)
NEUT%: 73.1 % (ref 39.0–75.0)
Platelets: 156 10*3/uL (ref 140–400)
RBC: 3.97 10*6/uL — ABNORMAL LOW (ref 4.20–5.82)
RDW: 14.4 % (ref 11.0–14.6)
RETIC %: 2.29 % — AB (ref 0.80–1.80)
Retic Ct Abs: 90.91 10*3/uL (ref 34.80–93.90)
WBC: 9.4 10*3/uL (ref 4.0–10.3)
lymph#: 1.9 10*3/uL (ref 0.9–3.3)

## 2016-02-07 MED ORDER — ENOXAPARIN SODIUM 100 MG/ML ~~LOC~~ SOLN: 100.0000 mg | SUBCUTANEOUS | Status: DC

## 2016-02-07 MED ORDER — DENOSUMAB 120 MG/1.7ML ~~LOC~~ SOLN
120.0000 mg | Freq: Once | SUBCUTANEOUS | Status: AC
  Administered 2016-02-07: 120 mg via SUBCUTANEOUS
  Filled 2016-02-07: qty 1.7

## 2016-02-07 NOTE — Telephone Encounter (Addendum)
Called Walter Wilkins to inform him that he already has a refill script at the [facility] for Dexamethasone '60mg'$  tabs to finish out his tapering dose.  He expressed thanks.

## 2016-02-07 NOTE — Progress Notes (Signed)
[place]  Telephone:([phone]) [phone] Fax:(336) [phone]  Clinic Follow Up Note   Patient Care Team: [name]. [name], MD as PCP - General (Internal Medicine) [name], MD as Consulting Physician (Hematology) 02/07/2016   CHIEF COMPLAINTS:  Follow up metastatic lung cancer  Oncology History   Metastatic lung cancer (metastasis from lung to other site)   Staging form: Lung, AJCC 7th Edition     Clinical: Stage IV (T3, N3, M1b) - Unsigned        Metastatic lung cancer (metastasis from lung to other site) ([place])   03/24/2015 Imaging brian MRI 1.9 cm high left frontal lobe mass with minimal surrounding edema   03/24/2015 Imaging CT showed Multiple low-density lesions in the liver worrisome for metastatic disease. Consolidative infiltrate in the left lower lobe with interstitial infiltrate in the left upper lobe. Single enlarged left hilar lymph node.   03/27/2015 Initial Diagnosis Metastatic lung cancer (metastasis from lung to other site)   03/27/2015 Pathology Results Metastatic adenocarcinoma, IHC positive for CK 7, TTF-1 and Napsin A. Negative for CK 20, CD X2, PSA, consistent with lung primary   03/27/2015 Miscellaneous Foundation One test showed (+) EML4-ALK fusion (variant 2), CDKN2A/B loss, SMAD4 R361H.    04/07/2015 Imaging PET scan showed hypermetabolic soft tissue infiltrates in the left lower lobe lung, bilateral mediastinal adenopathy,widespread metastatic disease, including to thoracic, low cervical nodes, liver and bones.   04/10/2015 - 04/10/2015 Radiation Therapy SRS to the brain met    04/27/2015 - 09/08/2015 Chemotherapy Crizotinib 250m bid, stopped due to disease preogression    08/08/2015 - 08/12/2015 [facility]  [facility] and [facility] showed small hemorrhage. Lovenox was held,  and he had a IVC filter Placed. Lovenox was resumed one month later.    09/09/2015 -  Chemotherapy Alectinib 605mbid       HISTORY OF PRESENTING ILLNESS (03/29/2015):  Walter Oaks[age].o. male is here because of newly diagnosed metastatic lung cancer.  He has been in good health until 3 month ago, when he started having intermittent nausea, and gradual weight loss a total of 18 lbs since then, although some are intentional by cutting back diet. He denies any vomiting, abdominal pain or discomfort, or change of his bowel habit.  He presented with syncope, right after he noticed some titch of left face, and rolling over of his eyes. He will [name] the ground and lost consciousness. This was witnessed by his wife and other people, 911 was called and paramedic staff underwent CPR for a total of 30 minutes. The cardiac monitor shows that he was in asystole. [facility] He workup in the ambulance, and did not have any significant in neurology deficit afterwards. He was seen by cardiology in the neurology service, cardiac catheterization was negative for coronary artery disease. He underwent a CT of head which showed a 1.9 cm lesion in the left frontal lobe with surrounding edema. CT of the chest reviewed no PE, but significant infiltrative consolidation in the left lower lobe, it was felt to be aspiration pneumonia. CT of the abdomen reviewed multiple diffuse liver metastasis. He underwent a liver biopsy which showed adenocarcinoma.   He was a discharge to home. He has been doing well overall since hospital discharge. He has some residual pain from the rib fracture from CPR. He denies any cough, dyspnea, or other symptoms. He  has mild fatigue and low appetite, which has been chronic since his gastric bypass surgery in 2011.   CURRENT THERAPY: Alectinib 600 mg twice daily started on 09/11/2015  INTERIM HISTORY: Kuper returns for follow-up and monthly Xgeva injection. He is doing well overall. He developed right-sided weakness on 01/05/2016, MRI brain showed increasing size  of enhancing left frontal lesion with greatly increased surrounding edema. He was started on dexamethasone by radiation oncology, and his right side weakness resolved within 24 hours. He is currently on tapering dose of dexamethasone 4 mg daily on the tolerating well and doing well overall. He denies any pain, dyspnea, cough, or other symptoms. He has good appetite, enjoys his stay in the [facility].  MEDICAL HISTORY:  Past Medical History  Diagnosis Date  . Diabetes mellitus without complication ([place])   . H/O gastric bypass     a. ~2011 at [facility].  . Acid reflux   . Seizures ([place]) 03/23/15  . Brain mass 03/24/15    ct head - left frontal lobe mass consistent with a brain metastasis  . Cancer ([place])     mass on lung, brain, sinuses, liver and bones    SURGICAL HISTORY: Past Surgical History  Procedure Laterality Date  . Left heart catheterization with coronary angiogram N/A 03/23/2015    Procedure: LEFT HEART CATHETERIZATION WITH CORONARY ANGIOGRAM;  Surgeon: Burnell Blanks, MD;  [facility];  Service: Cardiovascular;  Laterality: N/A;  . Gastric bypass  2011  . Liver biopsy  03/27/15  . Appendectomy    . Cholecystectomy    . Right knee osroscopy Right     SOCIAL HISTORY: History   Social History  . Marital Status: Unknown    Spouse Name: N/A  . Number of Children: 2  . Years of Education: N/A   Occupational History  . Barrister's clerk    Social History Main Topics  . Smoking status: Uses cigar   . Smokeless tobacco: Not on file  . Alcohol Use: 0.0 oz/week    0 Standard drinks or equivalent per week     Comment: Occasionally socially - once a week or less  . Drug Use: No  . Sexual Activity: Not on file   Other Topics Concern  . Not on file   Social History Narrative    FAMILY HISTORY: Family History  Problem Relation Age of Onset  . Valvular heart disease Father     H/o pig valve  . Cancer Father     skin cancer   . Sudden death Neg  Hx     No family history of sudden cardiac death  . Breast cancer Mother   . Cancer Mother 46    breast cancer   . Cancer Brother 42    base of tongue   . Cancer Maternal Uncle     throat cancer   . Cancer Maternal Uncle 90    colon cancer, and prostate cancer   . Cancer Cousin     lung cancer     ALLERGIES:  has No Known Allergies.  MEDICATIONS:  Current Outpatient Prescriptions  Medication Sig Dispense Refill  . Alectinib HCl 150 MG CAPS Take 600 mg by mouth 2 (two) times daily. 240 capsule 3  . ALPRAZolam (XANAX) 0.5 MG tablet Take 0.5 mg by mouth 3 (three) times daily as needed.     Marland Kitchen atorvastatin (LIPITOR) 40 MG tablet Take 40 mg by mouth at bedtime. Last Refill 08/22/2014    .  blood glucose meter kit and supplies KIT Dispense based on patient and insurance preference. Use up to four times daily as directed. (FOR ICD-9 250.00, 250.01). 1 each 0  . CALCIUM & MAGNESIUM CARBONATES PO Take 500 mg by mouth 2 (two) times daily.    . Calcium Carbonate-Vitamin D (CALCIUM-VITAMIN D) 500-200 MG-UNIT tablet Take by mouth.    . dexamethasone (DECADRON) 4 MG tablet Take 82m BID with food, then taper as directed per Dr. SPearlie Oysterinstructions 60 tablet 0  . DULoxetine (CYMBALTA) 60 MG capsule Take 60 mg by mouth.    . enoxaparin (LOVENOX) 100 MG/ML injection Inject 1 mL (100 mg total) into the skin daily. 30 Syringe 3  . levETIRAcetam (KEPPRA) 750 MG tablet Take 2 tablets (1,500 mg total) by mouth 2 (two) times daily.    . metFORMIN (GLUCOPHAGE) 1000 MG tablet Take 1,000 mg by mouth 2 (two) times daily with a meal. Last refill 08/23/2014    . mirtazapine (REMERON) 45 MG tablet TAKE 1 TABLET (45 MG TOTAL) BY MOUTH AT BEDTIME. 30 tablet 2  . Multiple Vitamin (MULTI-VITAMINS) TABS Take by mouth.    .Marland Kitchenomeprazole (PRILOSEC) 20 MG capsule Take 20 mg by mouth daily. Last Refill 08/22/2014    . ondansetron (ZOFRAN) 8 MG tablet Take 1 tablet (8 mg total) by mouth every 8 (eight) hours as needed for nausea  or vomiting. 30 tablet 3  . pentoxifylline (TRENTAL) 400 MG CR tablet Take 404mtab daily x 1 week, then 40054mID; take with food 60 tablet 5  . traZODone (DESYREL) 50 MG tablet Take 50 mg by mouth at bedtime.    . vitamin E 400 UNIT capsule Take 400 IU daily x 1 week, then 400 IU BID 60 capsule 5   No current facility-administered medications for this visit.    REVIEW OF SYSTEMS:   Constitutional: Denies fevers, chills or abnormal night sweats, (+) fatigue  Eyes: Denies blurriness of vision, double vision or watery eyes Ears, nose, mouth, throat, and face: Denies mucositis or sore throat Respiratory: Denies cough, dyspnea or wheezes Cardiovascular: Denies palpitation, chest discomfort or lower extremity swelling Gastrointestinal:  Mild intermittent nausea, no heartburn or change in bowel habits Skin: Denies abnormal skin rashes Lymphatics: Denies new lymphadenopathy or easy bruising Neurological:Denies numbness, tingling or new weaknesses Behavioral/Psych: Mood is stable, no new changes  All other systems were reviewed with the patient and are negative.  PHYSICAL EXAMINATION: ECOG PERFORMANCE STATUS: 1  Filed Vitals:   02/07/16 0908  BP: 124/77  Pulse: 65  Temp: 98 F (36.7 C)  Resp: 18   Filed Weights   02/07/16 0908  Weight: 277 lb (125.646 kg)    GENERAL:alert, no distress and comfortable SKIN: skin color, texture, turgor are normal, no rashes or significant lesions EYES: normal, conjunctiva are pink and non-injected, sclera clear OROPHARYNX:no exudate, no erythema and lips, buccal mucosa, and tongue normal  NECK: supple, thyroid normal size, non-tender, without nodularity LYMPH:  no palpable lymphadenopathy in the cervical, axillary or inguinal LUNGS: clear to auscultation and percussion with normal breathing effort HEART: regular rate & rhythm and no murmurs and no lower extremity edema ABDOMEN:abdomen soft, non-tender and normal bowel sounds Musculoskeletal:no  cyanosis of digits and no clubbing  PSYCH: alert & oriented x 3 with fluent speech NEURO: no focal motor/sensory deficits  LABORATORY DATA:  I have reviewed the data as listed CBC Latest Ref Rng 02/07/2016 01/08/2016 12/13/2015  WBC 4.0 - 10.3 10e3/uL 9.4 10.1 6.4  Hemoglobin 13.0 - 17.1 g/dL 12.5(L) 12.6(L) 11.6(L)  Hematocrit 38.4 - 49.9 % 38.0(L) 37.8(L) 35.3(L)  Platelets 140 - 400 10e3/uL 156 202 171    CMP Latest Ref Rng 02/07/2016 01/08/2016 12/13/2015  Glucose 70 - 140 mg/dl 205(H) 243(H) 289(H)  BUN 7.0 - 26.0 mg/dL 22.4 27.1(H) 23.6  Creatinine 0.7 - 1.3 mg/dL 1.2 1.3 1.2  Sodium 136 - 145 mEq/L 138 138 137  Potassium 3.5 - 5.1 mEq/L 4.6 4.3 4.4  CO2 22 - 29 mEq/L 25 24 27   Calcium 8.4 - 10.4 mg/dL 8.4 9.5 7.9(L)  Total Protein 6.4 - 8.3 g/dL 6.1(L) 7.1 6.3(L)  Total Bilirubin 0.20 - 1.20 mg/dL 0.63 0.54 0.51  Alkaline Phos 40 - 150 U/L 68 90 101  AST 5 - 34 U/L 17 15 16   ALT 0 - 55 U/L 31 21 17     PATHOLOGY REPORT: Liver, needle/core biopsy, right lobe 03/27/2015  - METASTATIC ADENOCARCINOMA, SEE COMMENT. Microscopic Comment The adenocarcinoma demonstrates the following immunophenotype Cytokeratin 7 - strong diffuse expression. Cytokeratin 20 - negative expression. TTF-1 - patchy moderate strong expression. Napsin A - strong diffuse expression. CDX-2 - negative expression. PSA - negative expression. Overall, the morphology and immunophenotype are that of metastatic adenocarcinoma, primary to lung. There is tumor available for ancillary tumor testing. The case is reviewed with Dr. Lyndon Code who concurs. The case was discussed with Dr Burr Medico on 03/29/2015. (CRR:gt:ecj, 03/29/15)  RADIOGRAPHIC STUDIES: I have personally reviewed the radiological images as listed and agreed with the findings in the report.  PET 12/13/2015 IMPRESSION: 1. Complete interval metabolic response. No residual hypermetabolic metastatic disease. 2. No residual hypermetabolism within the primary lung  malignancy in the left lower lobe, which has decreased in size. 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 Resolved left infrahilar nodal hypermetabolism. 3. Liver metastases have all decreased in size and demonstrate no residual hypermetabolism. 4. Increased sclerosis throughout the widespread sclerotic osseous metastases with no residual osseous hypermetabolism, in keeping with treatment effect.   Brain MRI with and without contrast 01/05/2016 IMPRESSION: 1. Increased size of enhancing left frontal lesion with greatly increased surrounding edema resulting in only trace midline shift. The appearance is suspicious for worsening post treatment change/radiation necrosis, however enlarging metastasis is also possible. 2. No evidence of new intracranial metastases. These results will be called to the ordering clinician or representative by the Radiologist Assistant, and communication documented in the PACS or zVision Dashboard.  Sclerotic metastasis right clivus is unchanged. No new lesions.  PET brain 01/18/2016 IMPRESSION: No hypermetabolism to suggest persistent metabolically active tumor. 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  ASSESSMENT & PLAN:  60 year old Caucasian male with minimal smoking history, obesity status post gastric bypass surgery, diabetes, presented with cardiac arrest and presumed seizure. Imaging study showed left lower lobe infiltrative consultation, left hilar adenopathy, diffuse liver metastasis and left front lobe brain metastasis.  1. 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 Images were reviewed in person. -We discussed that his cancer is incurable at this stage, but treatable. We now have a 3 ALK inhibitors available for this type  of lung cancer, with very good response rate and significantly increase patient's survival. -I discussed his recent restaging PET scan findings from 12/13/2015. It showed complete response to Alectinib, he is tolerating Alectinib very well, and he  is very excited about the excellent response -continue alectinib  -repeat PET in 8 weeks   2. 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 I discussed with his radiation call to Dr. Camelia Eng. -He has response to very well to dexamethasone, we discussed the role of Avastin, we'll hold off for now. -Alectinib has some CNS penetration, hopefully will control his brain mets also   3. Bone mets -Xgeva, continue every 4 weeks.  -He is taking calcium and vitamin D.  4. history of cardiac arrest -Likely related to his breathing metastasis and seizure -Cardiac cath was negative for coronary artery disease  5. Seizure, secondary to brain metastasis -Continue Keppra, his does was increased in 10/2015  -He will follow up with neurologist in [place]. He has not driven since the seizure.  6.  diabetes  -Continue follow-up with primary care physician.   7. DVT and Pulmonary embolism, diagnosed on 06/15/2015 -Probably related to his underline malignancy -We previously discussed the option of anticoagulation, which include Lovenox and Coumadin. Low molecular weight heparin (such as Lovenox) works better than coumadin to prevent recurrent thrombosis in cancer patients [facility] [phone]), and I recommend him continue lovenox indefinitely unless there is a contraindication such as recurrent bleeding. -he is on low dose of lovenox 163m daily now, due to the concern of tumor hemorrhage in brain. This was the concencus from CNS tumor board discussion.   9. Depression -[facility]proved,  [facility] -continue mirtazapine 478mat bedtime.   10. Anemia -mild and stable, developed since he started Alectinib, likely related  -will follow up closely  11. Hypocalcemia -secondary to XgDelton See-I asked him to increase calcium to three tab a day -Hold Xgeva if correct calcium<8  Follow-up -Continue Alectinib  -Xgeva today and monthly, hold if calcium<8  -RTC in one month, will order PET on next visit    All questions were answered. The patient knows to call the clinic with any problems, questions or concerns. I spent 25 minutes counseling the patient face to face. The total time spent in the appointment was 30 minutes and more than 50% was on counseling.     FeTruitt MerleMD 02/07/2016    7:04 AM

## 2016-02-07 NOTE — Telephone Encounter (Signed)
Pt confirmed labs/ov per 02/15 POF, gave pt AVS and Calendar.... KJ

## 2016-02-09 ENCOUNTER — Encounter: Payer: Self-pay | Admitting: Hematology

## 2016-02-09 NOTE — Progress Notes (Signed)
Enrolled pt in the [facility] [facility] [employer] program.  Faxed signed form and activated card today.

## 2016-02-12 ENCOUNTER — Encounter: Payer: Self-pay | Admitting: *Deleted

## 2016-02-12 NOTE — Progress Notes (Signed)
Mr. Walter Wilkins received a new script for 60 Dexamethasone tabs on 02/06/15

## 2016-02-16 ENCOUNTER — Other Ambulatory Visit: Payer: Self-pay | Admitting: Hematology

## 2016-02-28 ENCOUNTER — Other Ambulatory Visit: Payer: Self-pay | Admitting: Radiation Therapy

## 2016-02-28 DIAGNOSIS — C7931 Secondary malignant neoplasm of brain: Secondary | ICD-10-CM

## 2016-02-28 DIAGNOSIS — C7949 Secondary malignant neoplasm of other parts of nervous system: Principal | ICD-10-CM

## 2016-03-04 ENCOUNTER — Ambulatory Visit
Admission: RE | Admit: 2016-03-04 | Discharge: 2016-03-04 | Disposition: A | Discharge status: Home / Self Care | Payer: Commercial Managed Care - PPO | Source: Ambulatory Visit | Attending: Radiation Oncology | Admitting: Radiation Oncology

## 2016-03-04 DIAGNOSIS — C7949 Secondary malignant neoplasm of other parts of nervous system: Principal | ICD-10-CM

## 2016-03-04 DIAGNOSIS — C7931 Secondary malignant neoplasm of brain: Secondary | ICD-10-CM

## 2016-03-04 MED ORDER — GADOBENATE DIMEGLUMINE 529 MG/ML IV SOLN: 20.0000 mL | Freq: Once | INTRAVENOUS | Status: DC | PRN

## 2016-03-06 ENCOUNTER — Encounter: Payer: Self-pay | Admitting: Hematology

## 2016-03-06 ENCOUNTER — Ambulatory Visit
Admission: RE | Admit: 2016-03-06 | Discharge: 2016-03-06 | Disposition: A | Discharge status: Home / Self Care | Payer: Commercial Managed Care - PPO | Source: Ambulatory Visit | Attending: Radiation Oncology | Admitting: Radiation Oncology

## 2016-03-06 ENCOUNTER — Encounter: Payer: Self-pay | Admitting: Radiation Oncology

## 2016-03-06 ENCOUNTER — Ambulatory Visit (HOSPITAL_BASED_OUTPATIENT_CLINIC_OR_DEPARTMENT_OTHER): Payer: Commercial Managed Care - PPO | Admitting: Hematology

## 2016-03-06 ENCOUNTER — Ambulatory Visit (HOSPITAL_BASED_OUTPATIENT_CLINIC_OR_DEPARTMENT_OTHER): Payer: Commercial Managed Care - PPO

## 2016-03-06 ENCOUNTER — Telehealth: Payer: Self-pay | Admitting: Hematology

## 2016-03-06 ENCOUNTER — Other Ambulatory Visit (HOSPITAL_BASED_OUTPATIENT_CLINIC_OR_DEPARTMENT_OTHER): Payer: Commercial Managed Care - PPO

## 2016-03-06 VITALS — BP 128/71 | HR 69 | Temp 98.9°F | Resp 18 | Ht 72.0 in | Wt 285.2 lb

## 2016-03-06 VITALS — BP 133/64 | HR 65 | Temp 98.2°F | Ht 72.0 in | Wt 284.0 lb

## 2016-03-06 DIAGNOSIS — C7931 Secondary malignant neoplasm of brain: Secondary | ICD-10-CM | POA: Diagnosis not present

## 2016-03-06 DIAGNOSIS — C3432 Malignant neoplasm of lower lobe, left bronchus or lung: Secondary | ICD-10-CM

## 2016-03-06 DIAGNOSIS — I10 Essential (primary) hypertension: Secondary | ICD-10-CM

## 2016-03-06 DIAGNOSIS — F329 Major depressive disorder, single episode, unspecified: Secondary | ICD-10-CM

## 2016-03-06 DIAGNOSIS — C7951 Secondary malignant neoplasm of bone: Secondary | ICD-10-CM | POA: Diagnosis not present

## 2016-03-06 DIAGNOSIS — C3492 Malignant neoplasm of unspecified part of left bronchus or lung: Secondary | ICD-10-CM

## 2016-03-06 DIAGNOSIS — D630 Anemia in neoplastic disease: Secondary | ICD-10-CM

## 2016-03-06 DIAGNOSIS — Z86718 Personal history of other venous thrombosis and embolism: Secondary | ICD-10-CM | POA: Insufficient documentation

## 2016-03-06 DIAGNOSIS — Z86711 Personal history of pulmonary embolism: Secondary | ICD-10-CM

## 2016-03-06 DIAGNOSIS — E669 Obesity, unspecified: Secondary | ICD-10-CM

## 2016-03-06 DIAGNOSIS — E1169 Type 2 diabetes mellitus with other specified complication: Secondary | ICD-10-CM

## 2016-03-06 DIAGNOSIS — C3490 Malignant neoplasm of unspecified part of unspecified bronchus or lung: Secondary | ICD-10-CM

## 2016-03-06 LAB — CBC & DIFF AND RETIC
BASO%: 0.2 % (ref 0.0–2.0)
BASOS ABS: 0 10*3/uL (ref 0.0–0.1)
EOS%: 1 % (ref 0.0–7.0)
Eosinophils Absolute: 0.1 10*3/uL (ref 0.0–0.5)
HEMATOCRIT: 36.8 % — AB (ref 38.4–49.9)
HGB: 12.1 g/dL — ABNORMAL LOW (ref 13.0–17.1)
Immature Retic Fract: 24 % — ABNORMAL HIGH (ref 3.00–10.60)
LYMPH#: 2.5 10*3/uL (ref 0.9–3.3)
LYMPH%: 26.9 % (ref 14.0–49.0)
MCH: 31.4 pg (ref 27.2–33.4)
MCHC: 32.9 g/dL (ref 32.0–36.0)
MCV: 95.6 fL (ref 79.3–98.0)
MONO#: 0.6 10*3/uL (ref 0.1–0.9)
MONO%: 6.6 % (ref 0.0–14.0)
NEUT#: 6.1 10*3/uL (ref 1.5–6.5)
NEUT%: 65.3 % (ref 39.0–75.0)
PLATELETS: 233 10*3/uL (ref 140–400)
RBC: 3.85 10*6/uL — AB (ref 4.20–5.82)
RDW: 16 % — AB (ref 11.0–14.6)
RETIC %: 3.57 % — AB (ref 0.80–1.80)
RETIC CT ABS: 137.45 10*3/uL — AB (ref 34.80–93.90)
WBC: 9.3 10*3/uL (ref 4.0–10.3)

## 2016-03-06 LAB — COMPREHENSIVE METABOLIC PANEL
ALT: 21 U/L (ref 0–55)
ANION GAP: 10 meq/L (ref 3–11)
AST: 15 U/L (ref 5–34)
Albumin: 3.4 g/dL — ABNORMAL LOW (ref 3.5–5.0)
Alkaline Phosphatase: 73 U/L (ref 40–150)
BUN: 23.1 mg/dL (ref 7.0–26.0)
CALCIUM: 9.8 mg/dL (ref 8.4–10.4)
CHLORIDE: 101 meq/L (ref 98–109)
CO2: 28 meq/L (ref 22–29)
Creatinine: 1.2 mg/dL (ref 0.7–1.3)
EGFR: 67 mL/min/{1.73_m2} — ABNORMAL LOW (ref >90)
Glucose: 105 mg/dl (ref 70–140)
POTASSIUM: 4 meq/L (ref 3.5–5.1)
Sodium: 139 mEq/L (ref 136–145)
Total Bilirubin: 0.63 mg/dL (ref 0.20–1.20)
Total Protein: 6.4 g/dL (ref 6.4–8.3)

## 2016-03-06 MED ORDER — ENOXAPARIN SODIUM 100 MG/ML ~~LOC~~ SOLN: 100.0000 mg | SUBCUTANEOUS | Status: DC

## 2016-03-06 MED ORDER — ALPRAZOLAM 0.5 MG PO TABS: 0.5000 mg | ORAL_TABLET | Freq: Three times a day (TID) | ORAL | Status: DC | PRN

## 2016-03-06 MED ORDER — DENOSUMAB 120 MG/1.7ML ~~LOC~~ SOLN
120.0000 mg | Freq: Once | SUBCUTANEOUS | Status: AC
  Administered 2016-03-06: 120 mg via SUBCUTANEOUS
  Filled 2016-03-06: qty 1.7

## 2016-03-06 MED ORDER — DEXAMETHASONE 4 MG PO TABS: ORAL_TABLET | ORAL | Status: DC

## 2016-03-06 NOTE — Progress Notes (Signed)
Radiation Oncology         [phone]) [phone] ________________________________  Name: Walter Wilkins. MRN: [id number]  Date: 03/06/2016  DOB: 1956-03-30  Follow-Up Visit Note  Outpatient  CC: Yong Channel, MD  Orson Eva, MD  Diagnosis and Prior Radiotherapy: C79.31 Brain metastasis - non small cell lung cancer   ICD-9-CM ICD-10-CM   1. Brain metastasis (HCC) 198.3 C79.31 dexamethasone (DECADRON) 4 MG tablet     Indication for treatment:  palliative       Radiation treatment dates:   04/10/2015  Site/dose:   Left superior frontal 70m target was treated to a prescription dose of 18 Gy.     Beams/energy:  SRS using 4 Rapid Arc VMAT Beams / 6MV FFF  Narrative:  The patient returns today for routine follow-up.   Walter. WDittuspresents for follow up of radiation completed 04/09/16 to his Left Frontal Lobe for one dose. He denies pain. He denies any weakness to his Right upper or lower extremity. He just saw Dr. FBurr Medicoand will schedule a PET scan for next month, and he will follow up with Dr. FBurr Medicofor those results the next day. Reports mild sinus headaches, not daily. No numbness or weakness.  No other complaints other than a "seizure" weeks ago - "tongue twitched for about 30 seconds" without Loss of consciousness.  ALLERGIES:  has No Known Allergies.  Meds: Current Outpatient Prescriptions  Medication Sig Dispense Refill  . Alectinib HCl 150 MG CAPS Take 600 mg by mouth 2 (two) times daily. 240 capsule 3  . ALPRAZolam (XANAX) 0.5 MG tablet Take 1 tablet (0.5 mg total) by mouth 3 (three) times daily as needed. 90 tablet 0  . atorvastatin (LIPITOR) 40 MG tablet Take 40 mg by mouth at bedtime. Last Refill 08/22/2014    . blood glucose meter kit and supplies KIT Dispense based on patient and insurance preference. Use up to four times daily as directed. (FOR ICD-9 250.00, 250.01). 1 each 0  . CALCIUM & MAGNESIUM CARBONATES PO Take 500 mg by mouth 2 (two) times daily.    . Calcium Carbonate-Vitamin  D (CALCIUM-VITAMIN D) 500-200 MG-UNIT tablet Take by mouth.    . dexamethasone (DECADRON) 4 MG tablet Take 263m(1/2) tab) daily with food, then taper as directed per Dr. SqPearlie Oysternstructions 30 tablet 0  . DULoxetine (CYMBALTA) 60 MG capsule Take 60 mg by mouth.    . enoxaparin (LOVENOX) 100 MG/ML injection Inject 1 mL (100 mg total) into the skin daily. 30 Syringe 3  . levETIRAcetam (KEPPRA) 750 MG tablet Take 2 tablets (1,500 mg total) by mouth 2 (two) times daily.    . metFORMIN (GLUCOPHAGE) 1000 MG tablet Take 1,000 mg by mouth 2 (two) times daily with a meal. Last refill 08/23/2014    . mirtazapine (REMERON) 45 MG tablet TAKE 1 TABLET (45 MG TOTAL) BY MOUTH AT BEDTIME. 30 tablet 2  . Multiple Vitamin (MULTI-VITAMINS) TABS Take by mouth.    . Marland Kitchenmeprazole (PRILOSEC) 20 MG capsule Take 20 mg by mouth daily. Last Refill 08/22/2014    . ondansetron (ZOFRAN) 8 MG tablet Take 1 tablet (8 mg total) by mouth every 8 (eight) hours as needed for nausea or vomiting. 30 tablet 3  . pentoxifylline (TRENTAL) 400 MG CR tablet Take 40072mab daily x 1 week, then 400m61mD; take with food 60 tablet 5  . traZODone (DESYREL) 50 MG tablet Take 50 mg by mouth at bedtime.    . vitamin E 400  UNIT capsule Take 400 IU daily x 1 week, then 400 IU BID 60 capsule 5   No current facility-administered medications for this encounter.    Physical Findings: The patient is in no acute distress. Patient is alert and oriented.  height is 6' (1.829 m) and weight is 284 lb (128.822 kg). His temperature is 98.2 F (36.8 C). His blood pressure is 133/64 and his pulse is 65. His oxygen saturation is 99%. . General: Alert and oriented, in no acute distress HEENT: Head is normocephalic. Extraocular movements are intact. Oropharynx is clear with no thrush. Musculoskeletal:  symmetric strength and muscle tone throughout.   Neurologic: Cranial nerves II through XII are grossly intact. No obvious focalities. Speech is fluent.  Coordination is intact. Strength is intact and coordination is intact.  Sensation intact Psychiatric: Judgment and insight are intact. Affect is appropriate.  Lab Findings: Lab Results  Component Value Date   WBC 9.3 03/06/2016   HGB 12.1* 03/06/2016   HCT 36.8* 03/06/2016   MCV 95.6 03/06/2016   PLT 233 03/06/2016    Radiographic Findings: Walter Wilkins YS Contrast  03/04/2016  CLINICAL DATA:  S RS restaging. Metastatic disease with radiation necrosis left frontal parietal region. Follow-up. EXAM: MRI HEAD WITHOUT AND WITH CONTRAST TECHNIQUE: Multiplanar, multiecho pulse sequences of the brain and surrounding structures were obtained without and with intravenous contrast. CONTRAST:  20 cc MultiHance COMPARISON:  MRI 01/05/2016.  Brain PET 01/18/2016. FINDINGS: There is considerable reduction in vasogenic edema in the left frontal parietal region, with less mass effect and swelling. In the region of abnormal enhancement is reduced in size from 33 x 23 mm to a measurement of 29 x 18 mm. No findings are worsening or progression. No new lesions. Minimal chronic small-vessel ischemic changes of the white matter are unchanged. No hydrocephalus. No extra-axial collection. IMPRESSION: Decrease in size of the region of abnormal enhancement in the left posterior frontal cortical region since the study of January. Marked reduction in regional vasogenic edema. 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 No finding to suggest residual or recurrent tumor at this time. No new lesions. Electronically Signed   By: Nelson Chimes M.D.   On: 03/04/2016 09:31    Impression/Plan MRI improved, no new lesions. Favor radionecrosis.    MRI was reviewed and  discussed at tumor board.  Discussed continuing taper of Dexamethasone Take 1/2 tab (71m) daily with food until March 27th. On March 27th, take 1/2 tab every other day with food until May 1st.  Then, stop this  medication.  Continue Keppra, trental, Vit E  F/u in 343moith Dr StVertell Limberf NSU after next brain MRI.  I sent a message to Dr WiJannifer Franklinf neurology re: his seizure and missed followup in Jan, and requested rescheduling.   _____________________________________   SaEppie GibsonMD

## 2016-03-06 NOTE — Patient Instructions (Signed)
Dexamethasone Take 1/2 tab ('2mg'$ ) daily with food until March 27th. On March 27th, take 1/2 tab every other day with food until May 1st.  Then, stop this medication.

## 2016-03-06 NOTE — Telephone Encounter (Signed)
per pof to sch pt appt-gave pt copy of avs °

## 2016-03-06 NOTE — Progress Notes (Signed)
[place]  [phone] [phone]  Clinic Follow Up Note   Patient Care Team: [name]. [name], MD as PCP - General (Internal Medicine) [name], MD as Consulting Physician (Hematology) 03/06/2016   CHIEF COMPLAINTS:  Follow up metastatic lung cancer  Oncology History   Metastatic lung cancer (metastasis from lung to other site)   Staging form: Lung, AJCC 7th Edition     Clinical: Stage IV (T3, N3, M1b) - Unsigned        Metastatic lung cancer (metastasis from lung to other site) ([place])   03/24/2015 Imaging brian MRI 1.9 cm high left frontal lobe mass with minimal surrounding edema   03/24/2015 Imaging CT showed Multiple low-density lesions in the liver worrisome for metastatic disease. Consolidative infiltrate in the left lower lobe with interstitial infiltrate in the left upper lobe. Single enlarged left hilar lymph node.   03/27/2015 Initial Diagnosis Metastatic lung cancer (metastasis from lung to other site)   03/27/2015 Pathology Results Metastatic adenocarcinoma, IHC positive for CK 7, TTF-1 and Napsin A. Negative for CK 20, CD X2, PSA, consistent with lung primary   03/27/2015 Miscellaneous Foundation One test showed (+) EML4-ALK fusion (variant 2), CDKN2A/B loss, SMAD4 R361H.    04/07/2015 Imaging PET scan showed hypermetabolic soft tissue infiltrates in the left lower lobe lung, bilateral mediastinal adenopathy,widespread metastatic disease, including to thoracic, low cervical nodes, liver and bones.   04/10/2015 - 04/10/2015 Radiation Therapy SRS to the brain met    04/27/2015 - 09/08/2015 Chemotherapy Crizotinib 253m bid, stopped due to disease preogression    08/08/2015 - 08/12/2015 [facility]  [facility] and [facility] of [facility] showed small hemorrhage. Lovenox was held,  and he had a IVC filter Placed. Lovenox was resumed one month later.    09/09/2015 -  Chemotherapy Alectinib [id number]       HISTORY OF PRESENTING ILLNESS (03/29/2015):  Walter Oaks[age].o. male is here because of newly diagnosed metastatic lung cancer.  He has been in good health until 3 month ago, when he started having intermittent nausea, and gradual weight loss a total of 18 lbs since then, although some are intentional by cutting back diet. He denies any vomiting, abdominal pain or discomfort, or change of his bowel habit.  He presented with syncope, right after he noticed some titch of left face, and rolling over of his eyes. He will [name] the ground and lost consciousness. This was witnessed by his wife and other people, 911 was called and paramedic staff underwent CPR for a total of 30 minutes. The cardiac monitor shows that he was in asystole. [facility] He workup in the ambulance, and did not have any significant in neurology deficit afterwards. He was seen by cardiology in the neurology service, cardiac catheterization was negative for coronary artery disease. He underwent a CT of head which showed a 1.9 cm lesion in the left frontal lobe with surrounding edema. CT of the chest reviewed no PE, but significant infiltrative consolidation in the left lower lobe, it was felt to be aspiration pneumonia. CT of the abdomen reviewed multiple diffuse liver metastasis. He underwent a liver biopsy which showed adenocarcinoma.   He was a discharge to home. He has been doing well overall since hospital discharge. He has some residual pain from the rib fracture from CPR. He denies any cough, dyspnea, or other symptoms. He  has mild fatigue and low appetite, which has been chronic since his gastric bypass surgery in 2011.   CURRENT THERAPY: Alectinib 600 mg twice daily started on 09/11/2015  INTERIM HISTORY: Walter Wilkins returns for follow-up and monthly Xgeva injection. He is doing very well, on tapering dose of dexamethasone. He denies any headaches or other pain, no  neurological symptoms, no cough, dyspnea, or GI symptoms. He has good appetite and eating well. He appears to be in good aspirate. He is accompanied by his brother to clinic today. His compliant with Actonel and tolerating very well, no noticeable side effects.  MEDICAL HISTORY:  Past Medical History  Diagnosis Date  . Diabetes mellitus without complication ([place])   . H/O gastric bypass     a. ~2011 at [facility].  . Acid reflux   . Seizures ([place]) 03/23/15  . Brain mass 03/24/15    ct head - left frontal lobe mass consistent with a brain metastasis  . Cancer ([place])     mass on lung, brain, sinuses, liver and bones    SURGICAL HISTORY: Past Surgical History  Procedure Laterality Date  . Left heart catheterization with coronary angiogram N/A 03/23/2015    Procedure: LEFT HEART CATHETERIZATION WITH CORONARY ANGIOGRAM;  Surgeon: Burnell Blanks, MD;  Location: [facility] CATH LAB;  Service: Cardiovascular;  Laterality: N/A;  . Gastric bypass  2011  . Liver biopsy  03/27/15  . Appendectomy    . Cholecystectomy    . Right knee osroscopy Right     SOCIAL HISTORY: History   Social History  . Marital Status: Unknown    Spouse Name: N/A  . Number of Children: 2  . Years of Education: N/A   Occupational History  . Barrister's clerk    Social History Main Topics  . Smoking status: Uses cigar   . Smokeless tobacco: Not on file  . Alcohol Use: 0.0 oz/week    0 Standard drinks or equivalent per week     Comment: Occasionally socially - once a week or less  . Drug Use: No  . Sexual Activity: Not on file   Other Topics Concern  . Not on file   Social History Narrative    FAMILY HISTORY: Family History  Problem Relation Age of Onset  . Valvular heart disease Father     H/o pig valve  . Cancer Father     skin cancer   . Sudden death Neg Hx     No family history of sudden cardiac death  . Breast cancer Mother   . Cancer Mother 56    breast cancer   . Cancer Brother 42     base of tongue   . Cancer Maternal Uncle     throat cancer   . Cancer Maternal Uncle 90    colon cancer, and prostate cancer   . Cancer Cousin     lung cancer     ALLERGIES:  has No Known Allergies.  MEDICATIONS:  Current Outpatient Prescriptions  Medication Sig Dispense Refill  . Alectinib HCl 150 MG CAPS Take 600 mg by mouth 2 (two) times daily. 240 capsule 3  . ALPRAZolam (XANAX) 0.5 MG tablet Take 1 tablet (0.5 mg total) by mouth 3 (three) times daily as needed. 90 tablet 0  . atorvastatin (LIPITOR) 40 MG tablet Take 40 mg by mouth at bedtime. Last Refill 08/22/2014    . blood glucose meter kit and supplies KIT Dispense based on patient and insurance preference. Use up to  four times daily as directed. (FOR ICD-9 250.00, 250.01). 1 each 0  . CALCIUM & MAGNESIUM CARBONATES PO Take 500 mg by mouth 2 (two) times daily.    . Calcium Carbonate-Vitamin D (CALCIUM-VITAMIN D) 500-200 MG-UNIT tablet Take by mouth.    . DULoxetine (CYMBALTA) 60 MG capsule Take 60 mg by mouth.    . enoxaparin (LOVENOX) 100 MG/ML injection Inject 1 mL (100 mg total) into the skin daily. 30 Syringe 3  . levETIRAcetam (KEPPRA) 750 MG tablet Take 2 tablets (1,500 mg total) by mouth 2 (two) times daily.    . metFORMIN (GLUCOPHAGE) 1000 MG tablet Take 1,000 mg by mouth 2 (two) times daily with a meal. Last refill 08/23/2014    . mirtazapine (REMERON) 45 MG tablet TAKE 1 TABLET (45 MG TOTAL) BY MOUTH AT BEDTIME. 30 tablet 2  . Multiple Vitamin (MULTI-VITAMINS) TABS Take by mouth.    Marland Kitchen omeprazole (PRILOSEC) 20 MG capsule Take 20 mg by mouth daily. Last Refill 08/22/2014    . ondansetron (ZOFRAN) 8 MG tablet Take 1 tablet (8 mg total) by mouth every 8 (eight) hours as needed for nausea or vomiting. 30 tablet 3  . pentoxifylline (TRENTAL) 400 MG CR tablet Take 437m tab daily x 1 week, then 4078mBID; take with food 60 tablet 5  . traZODone (DESYREL) 50 MG tablet Take 50 mg by mouth at bedtime.    . vitamin E 400 UNIT  capsule Take 400 IU daily x 1 week, then 400 IU BID 60 capsule 5  . dexamethasone (DECADRON) 4 MG tablet Take 60m21m1/2) tab) daily with food, then taper as directed per Dr. SquPearlie Oysterstructions 30 tablet 0   No current facility-administered medications for this visit.    REVIEW OF SYSTEMS:   Constitutional: Denies fevers, chills or abnormal night sweats, (+) fatigue  Eyes: Denies blurriness of vision, double vision or watery eyes Ears, nose, mouth, throat, and face: Denies mucositis or sore throat Respiratory: Denies cough, dyspnea or wheezes Cardiovascular: Denies palpitation, chest discomfort or lower extremity swelling Gastrointestinal:  Mild intermittent nausea, no heartburn or change in bowel habits Skin: Denies abnormal skin rashes Lymphatics: Denies new lymphadenopathy or easy bruising Neurological:Denies numbness, tingling or new weaknesses Behavioral/Psych: Mood is stable, no new changes  All other systems were reviewed with the patient and are negative.  PHYSICAL EXAMINATION: ECOG PERFORMANCE STATUS: 1  Filed Vitals:   03/06/16 0823  BP: 128/71  Pulse: 69  Temp: 98.9 F (37.2 C)  Resp: 18   Filed Weights   03/06/16 0823  Weight: 285 lb 3.2 oz (129.366 kg)    GENERAL:alert, no distress and comfortable SKIN: skin color, texture, turgor are normal, no rashes or significant lesions EYES: normal, conjunctiva are pink and non-injected, sclera clear OROPHARYNX:no exudate, no erythema and lips, buccal mucosa, and tongue normal  NECK: supple, thyroid normal size, non-tender, without nodularity LYMPH:  no palpable lymphadenopathy in the cervical, axillary or inguinal LUNGS: clear to auscultation and percussion with normal breathing effort HEART: regular rate & rhythm and no murmurs and no lower extremity edema ABDOMEN:abdomen soft, non-tender and normal bowel sounds Musculoskeletal:no cyanosis of digits and no clubbing  PSYCH: alert & oriented x 3 with fluent  speech NEURO: no focal motor/sensory deficits  LABORATORY DATA:  I have reviewed the data as listed CBC Latest Ref Rng 03/06/2016 02/07/2016 01/08/2016  WBC 4.0 - 10.3 10e3/uL 9.3 9.4 10.1  Hemoglobin 13.0 - 17.1 g/dL 12.1(L) 12.5(L) 12.6(L)  Hematocrit 38.4 - 49.9 %  36.8(L) 38.0(L) 37.8(L)  Platelets 140 - 400 10e3/uL 233 156 202    CMP Latest Ref Rng 03/06/2016 02/07/2016 01/08/2016  Glucose 70 - 140 mg/dl 105 205(H) 243(H)  BUN 7.0 - 26.0 mg/dL 23.1 22.4 27.1(H)  Creatinine 0.7 - 1.3 mg/dL 1.2 1.2 1.3  Sodium 136 - 145 mEq/L 139 138 138  Potassium 3.5 - 5.1 mEq/L 4.0 4.6 4.3  CO2 22 - 29 mEq/L 28 25 24   Calcium 8.4 - 10.4 mg/dL 9.8 8.4 9.5  Total Protein 6.4 - 8.3 g/dL 6.4 6.1(L) 7.1  Total Bilirubin 0.20 - 1.20 mg/dL 0.63 0.63 0.54  Alkaline Phos 40 - 150 U/L 73 68 90  AST 5 - 34 U/L 15 17 15   ALT 0 - 55 U/L 21 31 21     PATHOLOGY REPORT: Liver, needle/core biopsy, right lobe 03/27/2015  - METASTATIC ADENOCARCINOMA, SEE COMMENT. Microscopic Comment The adenocarcinoma demonstrates the following immunophenotype Cytokeratin 7 - strong diffuse expression. Cytokeratin 20 - negative expression. TTF-1 - patchy moderate strong expression. Napsin A - strong diffuse expression. CDX-2 - negative expression. PSA - negative expression. Overall, the morphology and immunophenotype are that of metastatic adenocarcinoma, primary to lung. There is tumor available for ancillary tumor testing. The case is reviewed with Dr. Lyndon Code who concurs. The case was discussed with Dr Burr Medico on 03/29/2015. (CRR:gt:ecj, 03/29/15)  RADIOGRAPHIC STUDIES: I have personally reviewed the radiological images as listed and agreed with the findings in the report.  PET 12/13/2015 IMPRESSION: 1. Complete interval metabolic response. No residual hypermetabolic metastatic disease. 2. No residual hypermetabolism within the primary lung malignancy in the left lower lobe, which has decreased in size. Interval resolution of  the lymphangitic carcinomatosis in the left lung. Resolved left infrahilar nodal hypermetabolism. 3. Liver metastases have all decreased in size and demonstrate no residual hypermetabolism. 4. Increased sclerosis throughout the widespread sclerotic osseous metastases with no residual osseous hypermetabolism, in keeping with treatment effect.   Brain MRI with and without contrast 03/04/2016 IMPRESSION: Decrease in size of the region of abnormal enhancement in the left posterior frontal cortical region since the study of January. Marked reduction in regional vasogenic edema. 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 No finding to suggest residual or recurrent tumor at this time. No new lesions.   ASSESSMENT & PLAN:  60 year old Caucasian male with minimal smoking history, obesity status post gastric bypass surgery, diabetes, presented with cardiac arrest and presumed seizure. Imaging study showed left lower lobe infiltrative consultation, left hilar adenopathy, diffuse liver metastasis and left front lobe brain metastasis.  1. 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 Images were reviewed in person. -We discussed that his cancer is incurable at this stage, but treatable. We now have a 3 ALK inhibitors available for this type of lung cancer, with very good response rate and significantly increase patient's survival. -I discussed his recent restaging PET scan findings from 12/13/2015. It showed complete response to Alectinib, he is tolerating Alectinib very well, and he is very excited about the excellent response -continue alectinib  -repeat PET in mid April    2. Brain mets -s/p SRS, follow-up with radiation oncology -His restaging brain MRI from 03/04/2016 showed  decreasing size of his abnormal enhancement in the left posterior frontal cortical region, marked decrease in regional vasogenic edema, consistent with radiation necrosis. -He will follow-up  with Dr. Isidore Moos today, continue tapering off steroids -Alectinib has some CNS penetration, hopefully will control his brain mets also   3. Bone mets -Xgeva, continue every 4 weeks.  -He is taking calcium and vitamin D.  4. history of cardiac arrest -Likely related to his breathing metastasis and seizure -Cardiac cath was negative for coronary artery disease  5. Seizure, secondary to brain metastasis -Continue Keppra, his does was increased in 10/2015  -He will follow up with neurologist in [place]. He has not driven since the seizure.  6.  diabetes  -Continue follow-up with primary care physician.   7. DVT and Pulmonary embolism, diagnosed on 06/15/2015 -Probably related to his underline malignancy -We previously discussed the option of anticoagulation, which include Lovenox and Coumadin. Low molecular weight heparin (such as Lovenox) works better than coumadin to prevent recurrent thrombosis in cancer patients [facility] [phone]), and I recommend him continue lovenox indefinitely unless there is a contraindication such as recurrent bleeding. -he is on low dose of lovenox 139m daily now, due to the concern of tumor hemorrhage in brain. This was the concencus from CNS tumor board discussion.   9. Depression -Improved, [facility] -continue mirtazapine 426mat bedtime.   10. Anemia -mild and stable, developed since he started Alectinib, likely related  -will follow up closely  11. Hypocalcemia -secondary to Xgeva  -continue calcium 3 tab daily  -Hold Xgeva if correct calcium<8  Follow-up -Continue Alectinib  -Xgeva today and monthly, hold if calcium<8  -RTC in one month, with PET on same day    All questions were answered. The patient knows to call the  clinic with any problems, questions or concerns. I spent 15 minutes counseling the patient face to face. The total time spent in the appointment was 20 minutes and more than 50% was on counseling.     FeTruitt MerleMD 03/06/2016    7:46 PM

## 2016-03-06 NOTE — Progress Notes (Signed)
Mr. Astorino presents for follow up of radiation completed 04/09/16 to his Left Frontal Lobe for one dose. He denies pain. He denies any weakness to his Right upper or lower extremity. He just saw Dr. Burr Medico and will schedule a PET scan for next month, and he will follow up with Dr. Burr Medico for those results the next day.   BP 133/64 mmHg  Pulse 65  Temp(Src) 98.2 F (36.8 C)  Ht 6' (1.829 m)  Wt 284 lb (128.822 kg)  BMI 38.51 kg/m2  SpO2 99%   Wt Readings from Last 3 Encounters:  03/06/16 284 lb (128.822 kg)  03/06/16 285 lb 3.2 oz (129.366 kg)  03/04/16 275 lb (124.739 kg)

## 2016-03-08 ENCOUNTER — Telehealth: Payer: Self-pay | Admitting: *Deleted

## 2016-03-08 NOTE — Telephone Encounter (Signed)
LMVM for pt to call back re: RV appt in the next 2-3 weeks per Dr. Jannifer Franklin.  Please call back on Monday to schedule.

## 2016-03-12 NOTE — Telephone Encounter (Signed)
I spoke to wife.  Made appt for 1200 03-21-16 with Dr. Jannifer Franklin. (per Jannifer Franklin request).

## 2016-03-18 ENCOUNTER — Other Ambulatory Visit: Payer: Self-pay | Admitting: Neurology

## 2016-03-20 ENCOUNTER — Ambulatory Visit: Payer: Commercial Managed Care - PPO | Admitting: Radiation Oncology

## 2016-03-21 ENCOUNTER — Encounter: Payer: Self-pay | Admitting: Neurology

## 2016-03-21 ENCOUNTER — Ambulatory Visit (HOSPITAL_COMMUNITY)
Admission: RE | Admit: 2016-03-21 | Discharge: 2016-03-21 | Disposition: A | Discharge status: Home / Self Care | Payer: Commercial Managed Care - PPO | Source: Ambulatory Visit | Attending: Hematology | Admitting: Hematology

## 2016-03-21 ENCOUNTER — Telehealth: Payer: Self-pay | Admitting: *Deleted

## 2016-03-21 ENCOUNTER — Ambulatory Visit (INDEPENDENT_AMBULATORY_CARE_PROVIDER_SITE_OTHER): Payer: Commercial Managed Care - PPO | Admitting: Neurology

## 2016-03-21 VITALS — BP 132/76 | HR 63 | Ht 72.0 in | Wt 286.8 lb

## 2016-03-21 DIAGNOSIS — R569 Unspecified convulsions: Secondary | ICD-10-CM | POA: Diagnosis not present

## 2016-03-21 DIAGNOSIS — K769 Liver disease, unspecified: Secondary | ICD-10-CM | POA: Diagnosis not present

## 2016-03-21 DIAGNOSIS — C3492 Malignant neoplasm of unspecified part of left bronchus or lung: Secondary | ICD-10-CM | POA: Diagnosis not present

## 2016-03-21 DIAGNOSIS — M899 Disorder of bone, unspecified: Secondary | ICD-10-CM | POA: Insufficient documentation

## 2016-03-21 DIAGNOSIS — G939 Disorder of brain, unspecified: Secondary | ICD-10-CM

## 2016-03-21 DIAGNOSIS — G9389 Other specified disorders of brain: Secondary | ICD-10-CM

## 2016-03-21 MED ORDER — FLUDEOXYGLUCOSE F - 18 (FDG) INJECTION
14.0600 | Freq: Once | INTRAVENOUS | Status: AC | PRN
  Administered 2016-03-21: 14.06 via INTRAVENOUS

## 2016-03-21 MED ORDER — LEVETIRACETAM 750 MG PO TABS: 1500.0000 mg | ORAL_TABLET | Freq: Two times a day (BID) | ORAL | Status: DC

## 2016-03-21 NOTE — Progress Notes (Signed)
Reason for visit: Seizures  Walter Wilkins. is an 60 y.o. male  History of present illness:  [name] is a 60 year old gentleman with a history of lung cancer metastatic to brain in the left parasagittal area. The patient has been doing relatively well with the seizures, but he has had 2 brief seizures since November 2016. Last seizure event was about one month ago. The patient will have a sensation that his tongue is swelling, and he will have difficulty with speaking and making sense. The patient oftentimes will have seizures when he gets upset about something. When he calms down, the episode passes quickly. He is able to understand was going on during the event. The patient currently is on 1000 mg twice daily of Keppra, he is tolerating the medication relatively well without significant drowsiness or irritability. He has recently had a MRI of the brain that has shown a decrease in the edema, he has had PET scan done, the results are still pending. The patient reports no headaches, vision changes, numbness or weakness of the extremities, gait disturbance or falls.  Past Medical History  Diagnosis Date  . Diabetes mellitus without complication ([place])   . H/O gastric bypass     a. ~2011 at [facility].  . Acid reflux   . Seizures ([place]) 03/23/15  . Brain mass 03/24/15    ct head - left frontal lobe mass consistent with a brain metastasis  . Cancer ([place])     mass on lung, brain, sinuses, liver and bones    Past Surgical History  Procedure Laterality Date  . Left heart catheterization with coronary angiogram N/A 03/23/2015    Procedure: LEFT HEART CATHETERIZATION WITH CORONARY ANGIOGRAM;  Surgeon: Burnell Blanks, MD;  Location: [facility] CATH LAB;  Service: Cardiovascular;  Laterality: N/A;  . Gastric bypass  2011  . Liver biopsy  03/27/15  . Appendectomy    . Cholecystectomy    . Right knee osroscopy Right     Family History  Problem Relation Age of Onset  . Valvular heart  disease Father     H/o pig valve  . Cancer Father     skin cancer   . Sudden death Neg Hx     No family history of sudden cardiac death  . Breast cancer Mother   . Cancer Mother 65    breast cancer   . Cancer Brother 42    base of tongue   . Cancer Maternal Uncle     throat cancer   . Cancer Maternal Uncle 90    colon cancer, and prostate cancer   . Cancer Cousin     lung cancer     Social history:  reports that he has never smoked. He has never used smokeless tobacco. He reports that he drinks alcohol. He reports that he does not use illicit drugs.   No Known Allergies  Medications:  Prior to Admission medications   Medication Sig Start Date End Date Taking? Authorizing Provider  Alectinib HCl 150 MG CAPS Take 600 mg by mouth 2 (two) times daily. 01/03/16  Yes [name], MD  ALPRAZolam Duanne Moron) 0.5 MG tablet Take 1 tablet (0.5 mg total) by mouth 3 (three) times daily as needed. 03/06/16  Yes [name], MD  atorvastatin (LIPITOR) 40 MG tablet Take 40 mg by mouth at bedtime. Last Refill 08/22/2014   Yes Historical Provider, MD  blood glucose meter kit and supplies KIT Dispense based on patient  and insurance preference. Use up to four times daily as directed. (FOR ICD-9 250.00, 250.01). 03/28/15  Yes Orson Eva, MD  CALCIUM & MAGNESIUM CARBONATES PO Take 500 mg by mouth 2 (two) times daily. 06/19/15 06/18/16 Yes Historical Provider, MD  Calcium Carbonate-Vitamin D (CALCIUM-VITAMIN D) 500-200 MG-UNIT tablet Take by mouth. 06/19/15 06/18/16 Yes Historical Provider, MD  dexamethasone (DECADRON) 4 MG tablet Take '2mg'$  (1/2) tab) daily with food, then taper as directed per Dr. Pearlie Oyster instructions 03/06/16  Yes Eppie Gibson, MD  DULoxetine (CYMBALTA) 60 MG capsule Take 60 mg by mouth.   Yes Historical Provider, MD  enoxaparin (LOVENOX) 100 MG/ML injection Inject 1 mL (100 mg total) into the skin daily. 03/06/16  Yes [name], MD  glipiZIDE (GLUCOTROL) 5 MG tablet Take 5 mg by mouth daily before  breakfast.   Yes Historical Provider, MD  metFORMIN (GLUCOPHAGE) 1000 MG tablet Take 1,000 mg by mouth 2 (two) times daily with a meal. Last refill 08/23/2014   Yes Historical Provider, MD  mirtazapine (REMERON) 45 MG tablet TAKE 1 TABLET (45 MG TOTAL) BY MOUTH AT BEDTIME. 02/16/16  Yes [name], MD  Multiple Vitamin (MULTI-VITAMINS) TABS Take by mouth.   Yes Historical Provider, MD  omeprazole (PRILOSEC) 20 MG capsule Take 20 mg by mouth daily. Last Refill 08/22/2014   Yes Historical Provider, MD  ondansetron (ZOFRAN) 8 MG tablet Take 1 tablet (8 mg total) by mouth every 8 (eight) hours as needed for nausea or vomiting. 07/03/15  Yes [name], MD  pentoxifylline (TRENTAL) 400 MG CR tablet Take '400mg'$  tab daily x 1 week, then '400mg'$  BID; take with food 01/08/16  Yes Eppie Gibson, MD  traZODone (DESYREL) 50 MG tablet Take 50 mg by mouth at bedtime.   Yes Historical Provider, MD  vitamin E 400 UNIT capsule Take 400 IU daily x 1 week, then 400 IU BID 01/08/16  Yes Eppie Gibson, MD  levETIRAcetam (KEPPRA) 750 MG tablet Take 2 tablets (1,500 mg total) by mouth 2 (two) times daily. 03/21/16   Kathrynn Ducking, MD    ROS:  Out of a complete 14 system review of symptoms, the patient complains only of the following symptoms, and all other reviewed systems are negative.  Seizures  Blood pressure 132/76, pulse 63, height 6' (1.829 m), weight 286 lb 12 oz (130.069 kg).  Physical Exam  General: The patient is alert and cooperative at the time of the examination. The patient is markedly obese.  Skin: No significant peripheral edema is noted.   Neurologic Exam  Mental status: The patient is alert and oriented x 3 at the time of the examination. The patient has apparent normal recent and remote memory, with an apparently normal attention span and concentration ability.   Cranial nerves: Facial symmetry is present. Speech is normal, no aphasia or dysarthria is noted. Extraocular movements are full. Visual  fields are full.  Motor: The patient has good strength in all 4 extremities.  Sensory examination: Soft touch sensation is symmetric on the face, arms, and legs.  Coordination: The patient has good finger-nose-finger and heel-to-shin bilaterally.  Gait and station: The patient has a normal gait. Tandem gait is minimally unsteady. Romberg is negative. No drift is seen.  Reflexes: Deep tendon reflexes are symmetric.   Assessment/Plan:  1. Lung cancer, metastatic to brain  2. Seizures  The patient will be increased on the Keppra taking 1500 mg twice daily. A prescription was given for the 750 mg tablets, taking 2 twice  daily. He will follow-up in 5 months, sooner if needed. The patient does not operate a motor vehicle.  Jill Alexanders MD 03/22/2016 7:24 AM  [facility] [address] [place], [place] [address]  Phone [phone] Fax [phone]

## 2016-03-21 NOTE — Telephone Encounter (Signed)
Spoke with wife and pt to inform them re:  PET scan results with complete metabolic response as per Dr. Ernestina Penna instructions.  Ivin Booty voiced understanding.

## 2016-03-22 ENCOUNTER — Encounter: Payer: Self-pay | Admitting: Neurology

## 2016-03-22 LAB — GLUCOSE, CAPILLARY: GLUCOSE-CAPILLARY: 124 mg/dL — AB (ref 65–99)

## 2016-04-04 ENCOUNTER — Ambulatory Visit: Payer: Commercial Managed Care - PPO

## 2016-04-04 ENCOUNTER — Other Ambulatory Visit: Payer: Commercial Managed Care - PPO

## 2016-04-04 ENCOUNTER — Ambulatory Visit: Payer: Commercial Managed Care - PPO | Admitting: Hematology

## 2016-04-11 ENCOUNTER — Telehealth: Payer: Self-pay | Admitting: Hematology

## 2016-04-11 ENCOUNTER — Ambulatory Visit (HOSPITAL_BASED_OUTPATIENT_CLINIC_OR_DEPARTMENT_OTHER): Payer: Commercial Managed Care - PPO | Admitting: Hematology

## 2016-04-11 ENCOUNTER — Encounter: Payer: Self-pay | Admitting: Hematology

## 2016-04-11 ENCOUNTER — Ambulatory Visit (HOSPITAL_BASED_OUTPATIENT_CLINIC_OR_DEPARTMENT_OTHER): Payer: Commercial Managed Care - PPO

## 2016-04-11 ENCOUNTER — Other Ambulatory Visit (HOSPITAL_BASED_OUTPATIENT_CLINIC_OR_DEPARTMENT_OTHER): Payer: Commercial Managed Care - PPO

## 2016-04-11 ENCOUNTER — Other Ambulatory Visit: Payer: Self-pay | Admitting: Hematology

## 2016-04-11 VITALS — BP 132/77 | HR 63 | Temp 98.1°F | Resp 18 | Ht 72.0 in | Wt 296.1 lb

## 2016-04-11 DIAGNOSIS — E669 Obesity, unspecified: Secondary | ICD-10-CM

## 2016-04-11 DIAGNOSIS — D630 Anemia in neoplastic disease: Secondary | ICD-10-CM

## 2016-04-11 DIAGNOSIS — C3492 Malignant neoplasm of unspecified part of left bronchus or lung: Secondary | ICD-10-CM

## 2016-04-11 DIAGNOSIS — E1169 Type 2 diabetes mellitus with other specified complication: Secondary | ICD-10-CM

## 2016-04-11 DIAGNOSIS — C7931 Secondary malignant neoplasm of brain: Secondary | ICD-10-CM

## 2016-04-11 DIAGNOSIS — C3432 Malignant neoplasm of lower lobe, left bronchus or lung: Secondary | ICD-10-CM

## 2016-04-11 DIAGNOSIS — C7951 Secondary malignant neoplasm of bone: Secondary | ICD-10-CM | POA: Diagnosis not present

## 2016-04-11 DIAGNOSIS — D649 Anemia, unspecified: Secondary | ICD-10-CM

## 2016-04-11 DIAGNOSIS — C3490 Malignant neoplasm of unspecified part of unspecified bronchus or lung: Secondary | ICD-10-CM

## 2016-04-11 DIAGNOSIS — F329 Major depressive disorder, single episode, unspecified: Secondary | ICD-10-CM

## 2016-04-11 DIAGNOSIS — Z86718 Personal history of other venous thrombosis and embolism: Secondary | ICD-10-CM

## 2016-04-11 DIAGNOSIS — Z86711 Personal history of pulmonary embolism: Secondary | ICD-10-CM

## 2016-04-11 DIAGNOSIS — C787 Secondary malignant neoplasm of liver and intrahepatic bile duct: Secondary | ICD-10-CM

## 2016-04-11 DIAGNOSIS — I10 Essential (primary) hypertension: Secondary | ICD-10-CM

## 2016-04-11 LAB — CBC & DIFF AND RETIC
BASO%: 0.3 % (ref 0.0–2.0)
Basophils Absolute: 0 10*3/uL (ref 0.0–0.1)
EOS ABS: 0.3 10*3/uL (ref 0.0–0.5)
EOS%: 3.5 % (ref 0.0–7.0)
HCT: 36 % — ABNORMAL LOW (ref 38.4–49.9)
HGB: 11.9 g/dL — ABNORMAL LOW (ref 13.0–17.1)
IMMATURE RETIC FRACT: 29.2 % — AB (ref 3.00–10.60)
LYMPH#: 2.7 10*3/uL (ref 0.9–3.3)
LYMPH%: 28.7 % (ref 14.0–49.0)
MCH: 32.2 pg (ref 27.2–33.4)
MCHC: 33.1 g/dL (ref 32.0–36.0)
MCV: 97.6 fL (ref 79.3–98.0)
MONO#: 0.6 10*3/uL (ref 0.1–0.9)
MONO%: 6.8 % (ref 0.0–14.0)
NEUT%: 60.7 % (ref 39.0–75.0)
NEUTROS ABS: 5.7 10*3/uL (ref 1.5–6.5)
NRBC: 0 % (ref 0–0)
Platelets: 190 10*3/uL (ref 140–400)
RBC: 3.69 10*6/uL — AB (ref 4.20–5.82)
RDW: 16.1 % — AB (ref 11.0–14.6)
RETIC %: 3.33 % — AB (ref 0.80–1.80)
RETIC CT ABS: 122.88 10*3/uL — AB (ref 34.80–93.90)
WBC: 9.4 10*3/uL (ref 4.0–10.3)

## 2016-04-11 LAB — COMPREHENSIVE METABOLIC PANEL
ALT: 20 U/L (ref 0–55)
AST: 14 U/L (ref 5–34)
Albumin: 3.2 g/dL — ABNORMAL LOW (ref 3.5–5.0)
Alkaline Phosphatase: 71 U/L (ref 40–150)
Anion Gap: 9 mEq/L (ref 3–11)
BILIRUBIN TOTAL: 0.5 mg/dL (ref 0.20–1.20)
BUN: 24 mg/dL (ref 7.0–26.0)
CHLORIDE: 104 meq/L (ref 98–109)
CO2: 29 meq/L (ref 22–29)
CREATININE: 1 mg/dL (ref 0.7–1.3)
Calcium: 8.3 mg/dL — ABNORMAL LOW (ref 8.4–10.4)
EGFR: 77 mL/min/{1.73_m2} — ABNORMAL LOW (ref >90)
GLUCOSE: 110 mg/dL (ref 70–140)
Potassium: 4.1 mEq/L (ref 3.5–5.1)
SODIUM: 142 meq/L (ref 136–145)
TOTAL PROTEIN: 5.9 g/dL — AB (ref 6.4–8.3)

## 2016-04-11 MED ORDER — DENOSUMAB 120 MG/1.7ML ~~LOC~~ SOLN
120.0000 mg | Freq: Once | SUBCUTANEOUS | Status: AC
Start: 2016-04-11 — End: 2016-04-11
  Administered 2016-04-11: 120 mg via SUBCUTANEOUS
  Filled 2016-04-11: qty 1.7

## 2016-04-11 MED ORDER — ENOXAPARIN SODIUM 100 MG/ML ~~LOC~~ SOLN: 100.0000 mg | SUBCUTANEOUS | Status: AC

## 2016-04-11 NOTE — Progress Notes (Signed)
Patient here for X-geva injection.  Calcium level 8.3.  Talked with Yecheskel who says that he has missed several doses of Calcium w/Vitamin D.  Discussed why he needs to take this on a regular basis.  States that he is going to double up on the dose.

## 2016-04-11 NOTE — Telephone Encounter (Signed)
Gave pt appt for may & avs

## 2016-04-11 NOTE — Progress Notes (Signed)
[place]  Telephone:([phone]) [phone] Fax:([phone]) [phone]  Clinic Follow Up Note   Patient Care Team: [name]. [name], MD as PCP - General (Internal Medicine) [name], MD as Consulting Physician (Hematology) 04/11/2016   CHIEF COMPLAINTS:  Follow up metastatic lung cancer  Oncology History   Metastatic lung cancer (metastasis from lung to other site)   Staging form: Lung, AJCC 7th Edition     Clinical: Stage IV (T3, N3, M1b) - Unsigned        Metastatic lung cancer (metastasis from lung to other site) ([place])   03/24/2015 Imaging brian MRI 1.9 cm high left frontal lobe mass with minimal surrounding edema   03/24/2015 Imaging CT showed Multiple low-density lesions in the liver worrisome for metastatic disease. Consolidative infiltrate in the left lower lobe with interstitial infiltrate in the left upper lobe. Single enlarged left hilar lymph node.   03/27/2015 Initial Diagnosis Metastatic lung cancer (metastasis from lung to other site)   03/27/2015 Pathology Results Metastatic adenocarcinoma, IHC positive for CK 7, TTF-1 and Napsin A. Negative for CK 20, CD X2, PSA, consistent with lung primary   03/27/2015 Miscellaneous Foundation One test showed (+) EML4-ALK fusion (variant 2), CDKN2A/B loss, SMAD4 R361H.    04/07/2015 Imaging PET scan showed hypermetabolic soft tissue infiltrates in the left lower lobe lung, bilateral mediastinal adenopathy,widespread metastatic disease, including to thoracic, low cervical nodes, liver and bones.   04/10/2015 - 04/10/2015 Radiation Therapy SRS to the brain met    04/27/2015 - 09/08/2015 Chemotherapy Crizotinib 283m bid, stopped due to disease preogression    08/08/2015 - 08/12/2015 [facility] Ad[facility]  [facility] showed small hemorrhage. Lovenox was held,  and he had a IVC filter Placed. Lovenox was resumed one month later.    09/09/2015 -  Chemotherapy Alectinib 6075mbid    03/04/2016  Imaging Decreased in size of the region of abnormal enhancement in the left posterior frontal cortical region since the study of January, marked reduction in regional vasogenic edema. 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 no new lesions   03/21/2016 Imaging PET scan showed complete metabolic response, mild residual nodular scarring in the left lower lobe, residual hepatic lesion measuring up to 1.5 cm, multifocal sclerotic osseous lesions, without hypermetabolic of the above lesions.     HISTORY OF PRESENTING ILLNESS (03/29/2015):  Walter Oaks60  o. male is here because of newly diagnosed metastatic lung cancer.  He has been in good health until 3 month ago, when he started having intermittent nausea, and gradual weight loss a total of 18 lbs since then, although some are intentional by cutting back diet. He denies any vomiting, abdominal pain or discomfort, or change of his bowel habit.  He presented with syncope, right after he noticed some titch of left face, and rolling over of his eyes. He will [name] the ground and lost consciousness. This was witnessed by his wife and other people, 911 was called and paramedic staff underwent CPR for a total of 30 minutes. The cardiac monitor shows that he was in asystole. [facility] He workup in the ambulance, and did not have any significant in neurology deficit afterwards. He was seen by cardiology in the neurology service, cardiac catheterization was negative for coronary artery disease. He underwent a CT of head which showed a 1.9 cm lesion in the left frontal lobe with surrounding edema.  CT of the chest reviewed no PE, but significant infiltrative consolidation in the left lower lobe, it was felt to be aspiration pneumonia. CT of the abdomen reviewed multiple diffuse liver metastasis. He underwent a liver biopsy which showed adenocarcinoma.   He was a discharge to home. He has been doing well  overall since hospital discharge. He has some residual pain from the rib fracture from CPR. He denies any cough, dyspnea, or other symptoms. He has mild fatigue and low appetite, which has been chronic since his gastric bypass surgery in 2011.   CURRENT THERAPY:  1.Alectinib 600 mg twice daily started on 09/11/2015 2. Delton See every 4 weeks   INTERIM HISTORY: Walter Wilkins returns for follow-up and monthly Xgeva injection. He is accompanied by his brother to the clinic today. He is doing very well, still on tapering dose of dexamethasone (he does not remember the exact dose). He denies any significant pain, dyspnea, cough, or other symptoms. He has great appetite and has been gaining weight. No other complaints. His wife has recently found a job Research officer, political party, but still works part-time at [employer].   MEDICAL HISTORY:  Past Medical History  Diagnosis Date  . Diabetes mellitus without complication ([place])   . H/O gastric bypass     a. ~2011 at [facility].  . Acid reflux   . Seizures ([place]) 03/23/15  . Brain mass 03/24/15    ct head - left frontal lobe mass consistent with a brain metastasis  . Cancer ([place])     mass on lung, brain, sinuses, liver and bones    SURGICAL HISTORY: Past Surgical History  Procedure Laterality Date  . Left heart catheterization with coronary angiogram N/A 03/23/2015    Procedure: LEFT HEART CATHETERIZATION WITH CORONARY ANGIOGRAM;  Surgeon: Burnell Blanks, MD;  Location: [facility] CATH LAB;  Service: Cardiovascular;  Laterality: N/A;  . Gastric bypass  2011  . Liver biopsy  03/27/15  . Appendectomy    . Cholecystectomy    . Right knee osroscopy Right     SOCIAL HISTORY: History   Social History  . Marital Status: Unknown    Spouse Name: N/A  . Number of Children: 2  . Years of Education: N/A   Occupational History  . Barrister's clerk    Social History Main Topics  . Smoking status: Uses cigar   . Smokeless tobacco: Not on file  . Alcohol Use: 0.0 oz/week      0 Standard drinks or equivalent per week     Comment: Occasionally socially - once a week or less  . Drug Use: No  . Sexual Activity: Not on file   Other Topics Concern  . Not on file   Social History Narrative    FAMILY HISTORY: Family History  Problem Relation Age of Onset  . Valvular heart disease Father     H/o pig valve  . Cancer Father     skin cancer   . Sudden death Neg Hx     No family history of sudden cardiac death  . Breast cancer Mother   . Cancer Mother 86    breast cancer   . Cancer Brother 42    base of tongue   . Cancer Maternal Uncle     throat cancer   . Cancer Maternal Uncle 90    colon cancer, and prostate cancer   . Cancer Cousin     lung cancer     ALLERGIES:  has No Known Allergies.  MEDICATIONS:  Current Outpatient Prescriptions  Medication Sig Dispense Refill  . Alectinib HCl 150 MG CAPS Take 600 mg by mouth 2 (two) times daily. 240 capsule 3  . ALPRAZolam (XANAX) 0.5 MG tablet Take 1 tablet (0.5 mg total) by mouth 3 (three) times daily as needed. 90 tablet 0  . atorvastatin (LIPITOR) 40 MG tablet Take 40 mg by mouth at bedtime. Last Refill 08/22/2014    . blood glucose meter kit and supplies KIT Dispense based on patient and insurance preference. Use up to four times daily as directed. (FOR ICD-9 250.00, 250.01). 1 each 0  . Calcium Carbonate-Vitamin D (CALCIUM-VITAMIN D) 500-200 MG-UNIT tablet Take by mouth.    . dexamethasone (DECADRON) 4 MG tablet Take 65m (1/2) tab) daily with food, then taper as directed per Dr. SPearlie Oysterinstructions 30 tablet 0  . DULoxetine (CYMBALTA) 60 MG capsule Take 60 mg by mouth.    . enoxaparin (LOVENOX) 100 MG/ML injection Inject 1 mL (100 mg total) into the skin daily. 30 Syringe 3  . glipiZIDE (GLUCOTROL) 5 MG tablet Take 5 mg by mouth daily before breakfast.    . levETIRAcetam (KEPPRA) 750 MG tablet Take 2 tablets (1,500 mg total) by mouth 2 (two) times daily. 180 tablet 2  . metFORMIN (GLUCOPHAGE) 1000  MG tablet Take 1,000 mg by mouth 2 (two) times daily with a meal. Last refill 08/23/2014    . mirtazapine (REMERON) 45 MG tablet TAKE 1 TABLET (45 MG TOTAL) BY MOUTH AT BEDTIME. 30 tablet 2  . Multiple Vitamin (MULTI-VITAMINS) TABS Take by mouth.    .Marland Kitchenomeprazole (PRILOSEC) 20 MG capsule Take 20 mg by mouth daily. Last Refill 08/22/2014    . pentoxifylline (TRENTAL) 400 MG CR tablet Take 4043mtab daily x 1 week, then 40053mID; take with food 60 tablet 5  . traZODone (DESYREL) 50 MG tablet Take 50 mg by mouth at bedtime.    . vitamin E 400 UNIT capsule Take 400 IU daily x 1 week, then 400 IU BID 60 capsule 5  . ondansetron (ZOFRAN) 8 MG tablet Take 1 tablet (8 mg total) by mouth every 8 (eight) hours as needed for nausea or vomiting. (Patient not taking: Reported on 04/11/2016) 30 tablet 3   No current facility-administered medications for this visit.   Facility-Administered Medications Ordered in Other Visits  Medication Dose Route Frequency Provider Last Rate Last Dose  . denosumab (XGEVA) injection 120 mg  120 mg Subcutaneous Once YanTruitt MerleD        REVIEW OF SYSTEMS:   Constitutional: Denies fevers, chills or abnormal night sweats, (+) fatigue  Eyes: Denies blurriness of vision, double vision or watery eyes Ears, nose, mouth, throat, and face: Denies mucositis or sore throat Respiratory: Denies cough, dyspnea or wheezes Cardiovascular: Denies palpitation, chest discomfort or lower extremity swelling Gastrointestinal:  Mild intermittent nausea, no heartburn or change in bowel habits Skin: Denies abnormal skin rashes Lymphatics: Denies new lymphadenopathy or easy bruising Neurological:Denies numbness, tingling or new weaknesses Behavioral/Psych: Mood is stable, no new changes  All other systems were reviewed with the patient and are negative.  PHYSICAL EXAMINATION: ECOG PERFORMANCE STATUS: 1  Filed Vitals:   04/11/16 0844  BP: 132/77  Pulse: 63  Temp: 98.1 F (36.7 C)  Resp: 18    Filed Weights   04/11/16 0844  Weight: 296 lb 1.6 oz (134.31 kg)    GENERAL:alert, no distress and comfortable SKIN: skin color, texture, turgor are normal, no rashes or significant  lesions EYES: normal, conjunctiva are pink and non-injected, sclera clear OROPHARYNX:no exudate, no erythema and lips, buccal mucosa, and tongue normal  NECK: supple, thyroid normal size, non-tender, without nodularity LYMPH:  no palpable lymphadenopathy in the cervical, axillary or inguinal LUNGS: clear to auscultation and percussion with normal breathing effort HEART: regular rate & rhythm and no murmurs and no lower extremity edema ABDOMEN:abdomen soft, non-tender and normal bowel sounds Musculoskeletal:no cyanosis of digits and no clubbing  PSYCH: alert & oriented x 3 with fluent speech NEURO: no focal motor/sensory deficits  LABORATORY DATA:  I have reviewed the data as listed CBC Latest Ref Rng 04/11/2016 03/06/2016 02/07/2016  WBC 4.0 - 10.3 10e3/uL 9.4 9.3 9.4  Hemoglobin 13.0 - 17.1 g/dL 11.9(L) 12.1(L) 12.5(L)  Hematocrit 38.4 - 49.9 % 36.0(L) 36.8(L) 38.0(L)  Platelets 140 - 400 10e3/uL 190 233 156    CMP Latest Ref Rng 04/11/2016 03/06/2016 02/07/2016  Glucose 70 - 140 mg/dl 110 105 205(H)  BUN 7.0 - 26.0 mg/dL 24.0 23.1 22.4  Creatinine 0.7 - 1.3 mg/dL 1.0 1.2 1.2  Sodium 136 - 145 mEq/L 142 139 138  Potassium 3.5 - 5.1 mEq/L 4.1 4.0 4.6  CO2 22 - 29 mEq/L 29 28 25   Calcium 8.4 - 10.4 mg/dL 8.3(L) 9.8 8.4  Total Protein 6.4 - 8.3 g/dL 5.9(L) 6.4 6.1(L)  Total Bilirubin 0.20 - 1.20 mg/dL 0.50 0.63 0.63  Alkaline Phos 40 - 150 U/L 71 73 68  AST 5 - 34 U/L 14 15 17   ALT 0 - 55 U/L 20 21 31     PATHOLOGY REPORT: Liver, needle/core biopsy, right lobe 03/27/2015  - METASTATIC ADENOCARCINOMA, SEE COMMENT. Microscopic Comment The adenocarcinoma demonstrates the following immunophenotype Cytokeratin 7 - strong diffuse expression. Cytokeratin 20 - negative expression. TTF-1 - patchy moderate  strong expression. Napsin A - strong diffuse expression. CDX-2 - negative expression. PSA - negative expression. Overall, the morphology and immunophenotype are that of metastatic adenocarcinoma, primary to lung. There is tumor available for ancillary tumor testing. The case is reviewed with Dr. Lyndon Code who concurs. The case was discussed with Dr Burr Medico on 03/29/2015. (CRR:gt:ecj, 03/29/15)  RADIOGRAPHIC STUDIES: I have personally reviewed the radiological images as listed and agreed with the findings in the report.  PET  03/21/2016 IMPRESSION: Complete metabolic response.  Mild residual nodular scarring in the left lower lobe, without hypermetabolism.  Residual hepatic lesions measuring up to 1.5 cm, without hypermetabolism.  Multifocal sclerotic osseous lesions, without hypermetabolism.  Additional ancillary findings as above.   Brain MRI with and without contrast 03/04/2016 IMPRESSION: Decrease in size of the region of abnormal enhancement in the left posterior frontal cortical region since the study of January. Marked reduction in regional vasogenic edema. 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 No finding to suggest residual or recurrent tumor at this time. No new lesions.   ASSESSMENT & PLAN:  60 year old Caucasian male with minimal smoking history, obesity status post gastric bypass surgery, diabetes, presented with cardiac arrest and presumed seizure. Imaging study showed left lower lobe infiltrative consultation, left hilar adenopathy, diffuse liver metastasis and left front lobe brain metastasis.  1. 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 Images were reviewed in person. -We discussed that his cancer is incurable at this stage, but treatable.  We now have a 3 ALK inhibitors available for  this type of lung cancer, with very good response rate and significantly increase patient's survival. -I discussed his recent restaging PET scan findings from 03/21/2016. It showed complete response to Alectinib, small liver and sclerotic bone lesions are not hypermetabolic. He is tolerating Alectinib very well, and he is very excited about the excellent response -continue alectinib  -repeat PET in mid July  2. Brain mets -s/p SRS, follow-up with radiation oncology -His restaging brain MRI from 03/04/2016 showed decreasing size of his abnormal enhancement in the left posterior frontal cortical region, marked decrease in regional vasogenic edema, consistent with radiation necrosis. -He will follow-up with Dr. Isidore Moos today, continue tapering off steroids -Alectinib has some CNS penetration, hopefully will control his brain mets also   3. Bone mets -on Xgeva, continue every 4 weeks.  -He is taking calcium and vitamin D.  4. history of cardiac arrest -Likely related to his breathing metastasis and seizure -Cardiac cath was negative for coronary artery disease  5. Seizure, secondary to brain metastasis -Continue Keppra, his does was increased in 10/2015  -He will follow up with neurologist in [place]. He has not driven since the seizure.  6.  diabetes  -Continue follow-up with primary care physician.  -We discussed is that steroids can cause hyperglycemia, his blood glucose was 110 today.  7. DVT and Pulmonary embolism, diagnosed on 06/15/2015 -Probably related to his underline malignancy -We previously discussed the option of anticoagulation, which include Lovenox and Coumadin. Low molecular weight heparin (such as Lovenox) works better than coumadin to prevent recurrent thrombosis in cancer patients [facility] [phone]), and I recommend him continue lovenox indefinitely unless there is a contraindication such as recurrent  bleeding. -he is on low dose of lovenox 127m daily now, due to the concern of tumor hemorrhage in brain. This was the concencus from CNS tumor board discussion.   9. Depression -Improved, [facility] -continue mirtazapine 440mat bedtime.   10. Anemia -mild and stable, developed since he started Alectinib, likely related  -will follow up closely  11. Hypocalcemia -secondary to Xgeva  -continue calcium 3 tab daily  -Hold Xgeva if correct calcium<8  Follow-up -Continue Alectinib  -Xgeva today and monthly, hold if calcium<8  -RTC in one month for follow up and lab    All questions were answered. The patient knows to call the clinic with any problems, questions or concerns. I spent 25 minutes counseling the patient face to face. The total time spent in the appointment was 30 minutes and more than 50% was on counseling.     FeTruitt MerleMD 04/11/2016    9:15 AM

## 2016-04-23 ENCOUNTER — Other Ambulatory Visit: Payer: Self-pay | Admitting: *Deleted

## 2016-04-23 ENCOUNTER — Telehealth: Payer: Self-pay | Admitting: *Deleted

## 2016-04-23 DIAGNOSIS — C3492 Malignant neoplasm of unspecified part of left bronchus or lung: Secondary | ICD-10-CM

## 2016-04-23 MED ORDER — ALECTINIB HCL 150 MG PO CAPS: 600.0000 mg | ORAL_CAPSULE | Freq: Two times a day (BID) | ORAL | Status: DC

## 2016-04-23 NOTE — Telephone Encounter (Signed)
Call received from patient. Offered assistance but asking for collaborative.  Call transferred to ext [phone].

## 2016-05-03 ENCOUNTER — Telehealth: Payer: Self-pay | Admitting: *Deleted

## 2016-05-03 NOTE — Telephone Encounter (Signed)
Received vm call from pt stating that he needs some samples of alectinib to tide him over until Ins can be straightened out.  He states that [facility] suggested that he call.  Called & spoke with Arbie Cookey in Pharmacy & she reports that Celso Amy has an access to care form that may help to get his med & suggested that Raquel help with this.  Message to Raquel & to Montel Clock Pharmacist for suggestions/help.  Reached pt & he was able to call himself to [place] has a 14 day supply coming to him.  Informed Raquel & Gerald Stabs.

## 2016-05-06 ENCOUNTER — Other Ambulatory Visit: Payer: Self-pay | Admitting: Hematology

## 2016-05-07 ENCOUNTER — Telehealth: Payer: Self-pay | Admitting: Neurology

## 2016-05-07 ENCOUNTER — Telehealth: Payer: Self-pay | Admitting: *Deleted

## 2016-05-07 ENCOUNTER — Telehealth: Payer: Self-pay | Admitting: Radiation Therapy

## 2016-05-07 ENCOUNTER — Other Ambulatory Visit: Payer: Self-pay | Admitting: Radiation Therapy

## 2016-05-07 DIAGNOSIS — C7931 Secondary malignant neoplasm of brain: Secondary | ICD-10-CM

## 2016-05-07 DIAGNOSIS — C7949 Secondary malignant neoplasm of other parts of nervous system: Principal | ICD-10-CM

## 2016-05-07 NOTE — Telephone Encounter (Signed)
Susan[facility] [phone] called would like to talk with Rn about a few issues.  She would like a copy of the OV from 03/21/16 ([phone]) [phone] attn: Manuela Schwartz

## 2016-05-07 NOTE — Telephone Encounter (Addendum)
Walter Wilkins had missed an appointment with Dr. Lenor Coffin back in January of this year and later had a seizure. Dr. Isidore Moos asked that I follow-up to be sure that this appointment had been rescheduled. He did indeed see Dr. Jannifer Franklin on 3/30 and his Keppra dose was adjusted based on his recent seizure. The patient has been tolerating this well and has no other complaints.   Walter Wilkins is scheduled for a Follow-up MRI in on 6/16 and to see Dr. Vertell Limber on 6/19 for review of this scan.    Mont Dutton R.T.(R)(T) Special Procedures Navigator [phone]

## 2016-05-07 NOTE — Telephone Encounter (Signed)
Received call this am from pt stating that he is running out of his med ( alecensa ) & has been taking half of normal dose trying to stretch out med.  He thought he was going to get some med sent to him but it didn't come & when he called about it was told that was a mistake.  Talked with Montel Clock Pharmacist & he will try to contact drug company to see if they can get pt med until insurance starts.  Dr Burr Medico printed [name] for free program to see if we can get med for pt.  Faxed part that pt needs to fill out to [phone] per pt's wife, Walter Wilkins's request. Asked that Walter Wilkins fill out & fax back asap.

## 2016-05-08 ENCOUNTER — Encounter: Payer: Self-pay | Admitting: Pharmacist

## 2016-05-08 NOTE — Progress Notes (Signed)
02/10/46: Application for Alecensa patient assistance faxed to [facility] patient program this morning. Spoke with [place] rep/field reimbursement specialist. No samples available. Will focus on expediting application for assitance.

## 2016-05-08 NOTE — Telephone Encounter (Signed)
Copy of OV note from 03/21/16 faxed as requested. Left VM mssg for Manuela Schwartz to return call to discuss further if needed.

## 2016-05-09 ENCOUNTER — Telehealth: Payer: Self-pay | Admitting: Hematology

## 2016-05-09 ENCOUNTER — Other Ambulatory Visit (HOSPITAL_BASED_OUTPATIENT_CLINIC_OR_DEPARTMENT_OTHER): Payer: Commercial Managed Care - PPO

## 2016-05-09 ENCOUNTER — Encounter: Payer: Self-pay | Admitting: Hematology

## 2016-05-09 ENCOUNTER — Ambulatory Visit (HOSPITAL_BASED_OUTPATIENT_CLINIC_OR_DEPARTMENT_OTHER): Payer: Commercial Managed Care - PPO | Admitting: Hematology

## 2016-05-09 ENCOUNTER — Telehealth: Payer: Self-pay | Admitting: *Deleted

## 2016-05-09 ENCOUNTER — Ambulatory Visit (HOSPITAL_BASED_OUTPATIENT_CLINIC_OR_DEPARTMENT_OTHER): Payer: Commercial Managed Care - PPO

## 2016-05-09 VITALS — BP 127/67 | HR 63 | Temp 98.0°F | Resp 18 | Ht 72.0 in | Wt 303.2 lb

## 2016-05-09 DIAGNOSIS — D649 Anemia, unspecified: Secondary | ICD-10-CM | POA: Diagnosis not present

## 2016-05-09 DIAGNOSIS — C7951 Secondary malignant neoplasm of bone: Secondary | ICD-10-CM

## 2016-05-09 DIAGNOSIS — Z86718 Personal history of other venous thrombosis and embolism: Secondary | ICD-10-CM

## 2016-05-09 DIAGNOSIS — Z86711 Personal history of pulmonary embolism: Secondary | ICD-10-CM

## 2016-05-09 DIAGNOSIS — E669 Obesity, unspecified: Secondary | ICD-10-CM

## 2016-05-09 DIAGNOSIS — C3432 Malignant neoplasm of lower lobe, left bronchus or lung: Secondary | ICD-10-CM

## 2016-05-09 DIAGNOSIS — C7931 Secondary malignant neoplasm of brain: Secondary | ICD-10-CM

## 2016-05-09 DIAGNOSIS — C3492 Malignant neoplasm of unspecified part of left bronchus or lung: Secondary | ICD-10-CM

## 2016-05-09 DIAGNOSIS — I10 Essential (primary) hypertension: Secondary | ICD-10-CM

## 2016-05-09 DIAGNOSIS — D630 Anemia in neoplastic disease: Secondary | ICD-10-CM

## 2016-05-09 DIAGNOSIS — F329 Major depressive disorder, single episode, unspecified: Secondary | ICD-10-CM

## 2016-05-09 DIAGNOSIS — E1169 Type 2 diabetes mellitus with other specified complication: Secondary | ICD-10-CM

## 2016-05-09 LAB — CBC & DIFF AND RETIC
BASO%: 0.6 % (ref 0.0–2.0)
Basophils Absolute: 0.1 10*3/uL (ref 0.0–0.1)
EOS ABS: 0.4 10*3/uL (ref 0.0–0.5)
EOS%: 4.1 % (ref 0.0–7.0)
HCT: 35.5 % — ABNORMAL LOW (ref 38.4–49.9)
HEMOGLOBIN: 11.4 g/dL — AB (ref 13.0–17.1)
IMMATURE RETIC FRACT: 21.5 % — AB (ref 3.00–10.60)
LYMPH#: 2.1 10*3/uL (ref 0.9–3.3)
LYMPH%: 23.6 % (ref 14.0–49.0)
MCH: 31.7 pg (ref 27.2–33.4)
MCHC: 32.1 g/dL (ref 32.0–36.0)
MCV: 98.6 fL — ABNORMAL HIGH (ref 79.3–98.0)
MONO#: 0.6 10*3/uL (ref 0.1–0.9)
MONO%: 7 % (ref 0.0–14.0)
NEUT%: 64.7 % (ref 39.0–75.0)
NEUTROS ABS: 5.9 10*3/uL (ref 1.5–6.5)
Platelets: 184 10*3/uL (ref 140–400)
RBC: 3.6 10*6/uL — AB (ref 4.20–5.82)
RDW: 14.7 % — ABNORMAL HIGH (ref 11.0–14.6)
RETIC CT ABS: 83.16 10*3/uL (ref 34.80–93.90)
Retic %: 2.31 % — ABNORMAL HIGH (ref 0.80–1.80)
WBC: 9.1 10*3/uL (ref 4.0–10.3)

## 2016-05-09 LAB — COMPREHENSIVE METABOLIC PANEL
ALBUMIN: 3.2 g/dL — AB (ref 3.5–5.0)
ALT: 21 U/L (ref 0–55)
AST: 17 U/L (ref 5–34)
Alkaline Phosphatase: 61 U/L (ref 40–150)
Anion Gap: 8 mEq/L (ref 3–11)
BILIRUBIN TOTAL: 0.38 mg/dL (ref 0.20–1.20)
BUN: 23.2 mg/dL (ref 7.0–26.0)
CO2: 27 meq/L (ref 22–29)
CREATININE: 1.1 mg/dL (ref 0.7–1.3)
Calcium: 8.5 mg/dL (ref 8.4–10.4)
Chloride: 106 mEq/L (ref 98–109)
EGFR: 70 mL/min/{1.73_m2} — ABNORMAL LOW (ref >90)
GLUCOSE: 127 mg/dL (ref 70–140)
Potassium: 4.1 mEq/L (ref 3.5–5.1)
SODIUM: 140 meq/L (ref 136–145)
TOTAL PROTEIN: 5.9 g/dL — AB (ref 6.4–8.3)

## 2016-05-09 MED ORDER — DENOSUMAB 120 MG/1.7ML ~~LOC~~ SOLN
120.0000 mg | Freq: Once | SUBCUTANEOUS | Status: AC
  Administered 2016-05-09: 120 mg via SUBCUTANEOUS
  Filled 2016-05-09: qty 1.7

## 2016-05-09 NOTE — Progress Notes (Signed)
[name]  [phone] [phone]  Clinic Follow Up Note   Patient Care Team: [name]. [name], MD as PCP - General (Internal Medicine) [name], MD as Consulting Physician (Hematology) 05/09/2016   CHIEF COMPLAINTS:  Follow up metastatic lung cancer  Oncology History   Metastatic lung cancer (metastasis from lung to other site)   Staging form: Lung, AJCC 7th Edition     Clinical: Stage IV (T3, N3, M1b) - Unsigned        Metastatic lung cancer (metastasis from lung to other site) ([place])   03/24/2015 Imaging brian MRI 1.9 cm high left frontal lobe mass with minimal surrounding edema   03/24/2015 Imaging CT showed Multiple low-density lesions in the liver worrisome for metastatic disease. Consolidative infiltrate in the left lower lobe with interstitial infiltrate in the left upper lobe. Single enlarged left hilar lymph node.   03/27/2015 Initial Diagnosis Metastatic lung cancer (metastasis from lung to other site)   03/27/2015 Pathology Results Metastatic adenocarcinoma, IHC positive for CK 7, TTF-1 and Napsin A. Negative for CK 20, CD X2, PSA, consistent with lung primary   03/27/2015 Miscellaneous Foundation One test showed (+) EML4-ALK fusion (variant 2), CDKN2A/B loss, SMAD4 R361H.    04/07/2015 Imaging PET scan showed hypermetabolic soft tissue infiltrates in the left lower lobe lung, bilateral mediastinal adenopathy,widespread metastatic disease, including to thoracic, low cervical nodes, liver and bones.   04/10/2015 - 04/10/2015 Radiation Therapy SRS to the brain met    04/27/2015 - 09/08/2015 Chemotherapy Crizotinib 214m bid, stopped due to disease preogression    08/08/2015 - 08/12/2015 [facility]  [facility] showed small hemorrhage. Lovenox was held,  and he had a IVC filter Placed. Lovenox was resumed one month later.    09/09/2015 -  Chemotherapy Alectinib 6055mbid    03/04/2016  Imaging Decreased in size of the region of abnormal enhancement in the left posterior frontal cortical region since the study of January, marked reduction in regional vasogenic edema. 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 no new lesions   03/21/2016 Imaging PET scan showed complete metabolic response, mild residual nodular scarring in the left lower lobe, residual hepatic lesion measuring up to 1.5 cm, multifocal sclerotic osseous lesions, without hypermetabolic of the above lesions.     HISTORY OF PRESENTING ILLNESS (03/29/2015):  Walter [place]aks[age].o. male is here because of newly diagnosed metastatic lung cancer.  He has been in good health until 3 month ago, when he started having intermittent nausea, and gradual weight loss a total of 18 lbs since then, although some are intentional by cutting back diet. He denies any vomiting, abdominal pain or discomfort, or change of his bowel habit.  He presented with syncope, right after he noticed some titch of left face, and rolling over of his eyes. He will [name] the ground and lost consciousness. This was witnessed by his wife and other people, 911 was called and paramedic staff underwent CPR for a total of 30 minutes. The cardiac monitor shows that he was in asystole. [facility] He workup in the ambulance, and did not have any significant in neurology deficit afterwards. He was seen by cardiology in the neurology service, cardiac catheterization was negative for coronary artery disease. He underwent a CT of head which showed a 1.9 cm lesion in the left frontal lobe with surrounding edema.  CT of the chest reviewed no PE, but significant infiltrative consolidation in the left lower lobe, it was felt to be aspiration pneumonia. CT of the abdomen reviewed multiple diffuse liver metastasis. He underwent a liver biopsy which showed adenocarcinoma.   He was a discharge to home. He has been doing well  overall since hospital discharge. He has some residual pain from the rib fracture from CPR. He denies any cough, dyspnea, or other symptoms. He has mild fatigue and low appetite, which has been chronic since his gastric bypass surgery in 2011.   CURRENT THERAPY:  1.Alectinib 600 mg twice daily started on 09/11/2015 2. Delton See every 4 weeks   INTERIM HISTORY: Walter Wilkins returns for follow-up and monthly Xgeva injection. He is accompanied by his wife to the clinic today. Due to his wife's job change, he lost insurance temporary, and ran out of Alectinib 2 days ago, he actually reduced his dose for the past week before he run out, we have contact [place] patient access line, and is waiting for the approval for free drugs. His wife's new insurance will start next week. He feels well overall, has very good appetite, has gained weight due to steroids, he is weaning off the dexamethasone. He denies any significant pain, dyspnea, or other symptoms. Is not very physically active, but function well at home.   MEDICAL HISTORY:  Past Medical History  Diagnosis Date  . Diabetes mellitus without complication ([place])   . H/O gastric bypass     a. ~2011 at [facility].  . Acid reflux   . Seizures ([place]) 03/23/15  . Brain mass 03/24/15    ct head - left frontal lobe mass consistent with a brain metastasis  . Cancer ([place])     mass on lung, brain, sinuses, liver and bones    SURGICAL HISTORY: Past Surgical History  Procedure Laterality Date  . Left heart catheterization with coronary angiogram N/A 03/23/2015    Procedure: LEFT HEART CATHETERIZATION WITH CORONARY ANGIOGRAM;  Surgeon: Burnell Blanks, MD;  Location: [facility] CATH LAB;  Service: Cardiovascular;  Laterality: N/A;  . Gastric bypass  2011  . Liver biopsy  03/27/15  . Appendectomy    . Cholecystectomy    . Right knee osroscopy Right     SOCIAL HISTORY: History   Social History  . Marital Status: Unknown    Spouse Name: N/A  . Number of  Children: 2  . Years of Education: N/A   Occupational History  . Barrister's clerk    Social History Main Topics  . Smoking status: Uses cigar   . Smokeless tobacco: Not on file  . Alcohol Use: 0.0 oz/week    0 Standard drinks or equivalent per week     Comment: Occasionally socially - once a week or less  . Drug Use: No  . Sexual Activity: Not on file   Other Topics Concern  . Not on file   Social History Narrative    FAMILY HISTORY: Family History  Problem Relation Age of Onset  . Valvular heart disease Father     H/o pig valve  . Cancer Father     skin cancer   . Sudden death Neg Hx     No family history of sudden cardiac death  . Breast cancer Mother   . Cancer Mother 82    breast cancer   . Cancer Brother 42    base of tongue   . Cancer Maternal Uncle     throat cancer   .  Cancer Maternal Uncle 90    colon cancer, and prostate cancer   . Cancer Cousin     lung cancer     ALLERGIES:  has No Known Allergies.  MEDICATIONS:  Current Outpatient Prescriptions  Medication Sig Dispense Refill  . alectinib (ALECENSA) 150 MG capsule Take 4 capsules (600 mg total) by mouth 2 (two) times daily. 240 capsule 3  . ALPRAZolam (XANAX) 0.5 MG tablet TAKE ONE TABLET BY MOUTH THREE TIMES A DAY AS NEEDED 90 tablet 0  . atorvastatin (LIPITOR) 40 MG tablet Take 40 mg by mouth at bedtime. Last Refill 08/22/2014    . blood glucose meter kit and supplies KIT Dispense based on patient and insurance preference. Use up to four times daily as directed. (FOR ICD-9 250.00, 250.01). 1 each 0  . Calcium Carbonate-Vitamin D (CALCIUM-VITAMIN D) 500-200 MG-UNIT tablet Take by mouth.    . dexamethasone (DECADRON) 4 MG tablet Take 37m (1/2) tab) daily with food, then taper as directed per Dr. SPearlie Oysterinstructions 30 tablet 0  . DULoxetine (CYMBALTA) 60 MG capsule Take 60 mg by mouth.    . enoxaparin (LOVENOX) 100 MG/ML injection Inject 1 mL (100 mg total) into the skin daily. 30 Syringe 3  .  glipiZIDE (GLUCOTROL) 5 MG tablet Take 5 mg by mouth daily before breakfast.    . levETIRAcetam (KEPPRA) 750 MG tablet Take 2 tablets (1,500 mg total) by mouth 2 (two) times daily. 180 tablet 2  . metFORMIN (GLUCOPHAGE) 1000 MG tablet Take 1,000 mg by mouth 2 (two) times daily with a meal. Last refill 08/23/2014    . mirtazapine (REMERON) 45 MG tablet TAKE 1 TABLET (45 MG TOTAL) BY MOUTH AT BEDTIME. 30 tablet 2  . Multiple Vitamin (MULTI-VITAMINS) TABS Take by mouth.    .Marland Kitchenomeprazole (PRILOSEC) 20 MG capsule Take 20 mg by mouth daily. Last Refill 08/22/2014    . ondansetron (ZOFRAN) 8 MG tablet Take 1 tablet (8 mg total) by mouth every 8 (eight) hours as needed for nausea or vomiting. (Patient not taking: Reported on 04/11/2016) 30 tablet 3  . pentoxifylline (TRENTAL) 400 MG CR tablet Take 4011mtab daily x 1 week, then 40034mID; take with food 60 tablet 5  . traZODone (DESYREL) 50 MG tablet Take 50 mg by mouth at bedtime.    . vitamin E 400 UNIT capsule Take 400 IU daily x 1 week, then 400 IU BID 60 capsule 5   No current facility-administered medications for this visit.   Facility-Administered Medications Ordered in Other Visits  Medication Dose Route Frequency Provider Last Rate Last Dose  . denosumab (XGEVA) injection 120 mg  120 mg Subcutaneous Once YanTruitt MerleD        REVIEW OF SYSTEMS:   Constitutional: Denies fevers, chills or abnormal night sweats, (+) fatigue stable  Eyes: Denies blurriness of vision, double vision or watery eyes Ears, nose, mouth, throat, and face: Denies mucositis or sore throat Respiratory: Denies cough, dyspnea or wheezes Cardiovascular: Denies palpitation, chest discomfort or lower extremity swelling Gastrointestinal:  Mild intermittent nausea, no heartburn or change in bowel habits Skin: Denies abnormal skin rashes Lymphatics: Denies new lymphadenopathy or easy bruising Neurological:Denies numbness, tingling or new weaknesses Behavioral/Psych: Mood is stable,  no new changes  All other systems were reviewed with the patient and are negative.  PHYSICAL EXAMINATION: ECOG PERFORMANCE STATUS: 1  Filed Vitals:   05/09/16 0807  BP: 127/67  Pulse: 63  Temp: 98 F (36.7 C)  Resp: 18  Filed Weights   05/09/16 0807  Weight: 303 lb 3.2 oz (137.531 kg)    GENERAL:alert, no distress and comfortable SKIN: skin color, texture, turgor are normal, no rashes or significant lesions EYES: normal, conjunctiva are pink and non-injected, sclera clear OROPHARYNX:no exudate, no erythema and lips, buccal mucosa, and tongue normal  NECK: supple, thyroid normal size, non-tender, without nodularity LYMPH:  no palpable lymphadenopathy in the cervical, axillary or inguinal LUNGS: clear to auscultation and percussion with normal breathing effort HEART: regular rate & rhythm and no murmurs and no lower extremity edema ABDOMEN:abdomen soft, non-tender and normal bowel sounds Musculoskeletal:no cyanosis of digits and no clubbing  PSYCH: alert & oriented x 3 with fluent speech NEURO: no focal motor/sensory deficits  LABORATORY DATA:  I have reviewed the data as listed CBC Latest Ref Rng 05/09/2016 04/11/2016 03/06/2016  WBC 4.0 - 10.3 10e3/uL 9.1 9.4 9.3  Hemoglobin 13.0 - 17.1 g/dL 11.4(L) 11.9(L) 12.1(L)  Hematocrit 38.4 - 49.9 % 35.5(L) 36.0(L) 36.8(L)  Platelets 140 - 400 10e3/uL 184 190 233    CMP Latest Ref Rng 05/09/2016 04/11/2016 03/06/2016  Glucose 70 - 140 mg/dl 127 110 105  BUN 7.0 - 26.0 mg/dL 23.2 24.0 23.1  Creatinine 0.7 - 1.3 mg/dL 1.1 1.0 1.2  Sodium 136 - 145 mEq/L 140 142 139  Potassium 3.5 - 5.1 mEq/L 4.1 4.1 4.0  CO2 22 - 29 mEq/L _0 Calcium 8.4 - 10.4 mg/dL 8.5 8.3(L) 9.8  Total Protein 6.4 - 8.3 g/dL 5.9(L) 5.9(L) 6.4  Total Bilirubin 0.20 - 1.20 mg/dL 0.38 0.50 0.63  Alkaline Phos 40 - 150 U/L 61 71 73  AST 5 - 34 U/L _1 ALT 0 - 55 U/L _2 PATHOLOGY REPORT: Liver, needle/core biopsy, right lobe 03/27/2015  -  METASTATIC ADENOCARCINOMA, SEE COMMENT. Microscopic Comment The adenocarcinoma demonstrates the following immunophenotype Cytokeratin 7 - strong diffuse expression. Cytokeratin 20 - negative expression. TTF-1 - patchy moderate strong expression. Napsin A - strong diffuse expression. CDX-2 - negative expression. PSA - negative expression. Overall, the morphology and immunophenotype are that of metastatic adenocarcinoma, primary to lung. There is tumor available for ancillary tumor testing. The case is reviewed with Dr. Lyndon Code who concurs. The case was discussed with Dr Burr Medico on 03/29/2015. (CRR:gt:ecj, 03/29/15)  RADIOGRAPHIC STUDIES: I have personally reviewed the radiological images as listed and agreed with the findings in the report.  PET  03/21/2016 IMPRESSION: Complete metabolic response.  Mild residual nodular scarring in the left lower lobe, without hypermetabolism.  Residual hepatic lesions measuring up to 1.5 cm, without hypermetabolism.  Multifocal sclerotic osseous lesions, without hypermetabolism.  Additional ancillary findings as above.   Brain MRI with and without contrast 03/04/2016 IMPRESSION: Decrease in size of the region of abnormal enhancement in the left posterior frontal cortical region since the study of January. Marked reduction in regional vasogenic edema. 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 No finding to suggest residual or recurrent tumor at this time. No new lesions.   ASSESSMENT & PLAN:  60 year old Caucasian male with minimal smoking history, obesity status post gastric bypass surgery, diabetes, presented with cardiac arrest and presumed seizure. Imaging study showed left lower lobe infiltrative consultation, left hilar adenopathy, diffuse liver metastasis and left front lobe brain metastasis.  1. 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 Images were reviewed in person. -We discussed that his cancer is incurable at this stage, but treatable. We now have a 3 ALK inhibitors available for this type of lung cancer, with very good response rate and significantly increase patient's survival. -I discussed his recent restaging PET scan findings from 03/21/2016. It showed complete response to Alectinib, small liver and sclerotic bone lesions are not hypermetabolic. He is tolerating Alectinib very well, and he is very excited about the excellent response -continue alectinib, he run out for several days, hopefully we'll receive free drug in the next few days. -repeat PET in mid July  2. Brain mets -s/p SRS, follow-up with radiation oncology -His restaging brain MRI from 03/04/2016 showed decreasing size of his abnormal enhancement in the left posterior frontal cortical region, marked decrease in regional vasogenic edema, consistent with radiation necrosis. -He will follow-up with Dr. Isidore Moos, continue tapering off steroids, restaging brain MRI is scheduled for June 16 -Alectinib has some CNS penetration, hopefully will control his brain mets also   3. Bone mets -on Xgeva, continue every 4 weeks.  -He is taking calcium 2-3 tab daily and vitamin D.  4. history of cardiac arrest -Likely related to his breathing metastasis and seizure -Cardiac cath was negative for coronary artery disease  5. Seizure, secondary to brain metastasis -Continue Keppra, his does was increased in 10/2015  -He will follow up with neurologist in [place]. He has not driven since the seizure.  6.  diabetes  -Continue follow-up with primary care physician.  -We discussed is that steroids can cause hyperglycemia, his blood glucose was 127 today.  7. DVT and Pulmonary embolism, diagnosed on 06/15/2015 -Probably related to his  underline malignancy -We previously discussed the option of anticoagulation, which include Lovenox and Coumadin. Low molecular weight heparin (such as Lovenox) works better than coumadin to prevent recurrent thrombosis in cancer patients [facility] [phone]), and I recommend him continue lovenox indefinitely unless there is a contraindication such as recurrent bleeding. -he is on low dose of lovenox 183m daily now, due to the concern of tumor hemorrhage in brain. This was the concencus from CNS tumor board discussion.   9. [facility] -continue mirtazapine 419mat bedtime.   10. Anemia -mild and stable, developed since he started Alectinib, likely related  -will follow up closely  11. Hypocalcemia -secondary to Xgeva  -continue calcium 3 tab daily  -Hold Xgeva if correct calcium<8  Follow-up -restart Alectinib as soon as he receives the medication  -Xgeva today and monthly, hold if calcium<8  -RTC in one month for follow up and lab, and restaging PET on 6/13    All questions were answered. The patient knows to call the clinic with any problems, questions or concerns. I spent 25 minutes counseling the patient face to face. The total time spent in the appointment was 30 minutes and more than 50% was on counseling.     FeTruitt MerleMD 05/09/2016    8:56 AM

## 2016-05-09 NOTE — Telephone Encounter (Signed)
Gave and printed appt sched and avs for pt for May and June  °

## 2016-05-09 NOTE — Patient Instructions (Signed)
Denosumab injection  What is this medicine?  DENOSUMAB (den oh sue mab) slows bone breakdown. Prolia is used to treat osteoporosis in women after menopause and in men. Xgeva is used to prevent bone fractures and other bone problems caused by cancer bone metastases. Xgeva is also used to treat giant cell tumor of the bone.  This medicine may be used for other purposes; ask your health care provider or pharmacist if you have questions.  What should I tell my health care provider before I take this medicine?  They need to know if you have any of these conditions:  -dental disease  -eczema  -infection or history of infections  -kidney disease or on dialysis  -low blood calcium or vitamin D  -malabsorption syndrome  -scheduled to have surgery or tooth extraction  -taking medicine that contains denosumab  -thyroid or parathyroid disease  -an unusual reaction to denosumab, other medicines, foods, dyes, or preservatives  -pregnant or trying to get pregnant  -breast-feeding  How should I use this medicine?  This medicine is for injection under the skin. It is given by a health care professional in a hospital or clinic setting.  If you are getting Prolia, a special MedGuide will be given to you by the pharmacist with each prescription and refill. Be sure to read this information carefully each time.  For Prolia, talk to your pediatrician regarding the use of this medicine in children. Special care may be needed. For Xgeva, talk to your pediatrician regarding the use of this medicine in children. While this drug may be prescribed for children as young as 13 years for selected conditions, precautions do apply.  Overdosage: If you think you have taken too much of this medicine contact a poison control center or emergency room at once.  NOTE: This medicine is only for you. Do not share this medicine with others.  What if I miss a dose?  It is important not to miss your dose. Call your doctor or health care professional if you are  unable to keep an appointment.  What may interact with this medicine?  Do not take this medicine with any of the following medications:  -other medicines containing denosumab  This medicine may also interact with the following medications:  -medicines that suppress the immune system  -medicines that treat cancer  -steroid medicines like prednisone or cortisone  This list may not describe all possible interactions. Give your health care provider a list of all the medicines, herbs, non-prescription drugs, or dietary supplements you use. Also tell them if you smoke, drink alcohol, or use illegal drugs. Some items may interact with your medicine.  What should I watch for while using this medicine?  Visit your doctor or health care professional for regular checks on your progress. Your doctor or health care professional may order blood tests and other tests to see how you are doing.  Call your doctor or health care professional if you get a cold or other infection while receiving this medicine. Do not treat yourself. This medicine may decrease your body's ability to fight infection.  You should make sure you get enough calcium and vitamin D while you are taking this medicine, unless your doctor tells you not to. Discuss the foods you eat and the vitamins you take with your health care professional.  See your dentist regularly. Brush and floss your teeth as directed. Before you have any dental work done, tell your dentist you are receiving this medicine.  Do   not become pregnant while taking this medicine or for 5 months after stopping it. Women should inform their doctor if they wish to become pregnant or think they might be pregnant. There is a potential for serious side effects to an unborn child. Talk to your health care professional or pharmacist for more information.  What side effects may I notice from receiving this medicine?  Side effects that you should report to your doctor or health care professional as soon as  possible:  -allergic reactions like skin rash, itching or hives, swelling of the face, lips, or tongue  -breathing problems  -chest pain  -fast, irregular heartbeat  -feeling faint or lightheaded, falls  -fever, chills, or any other sign of infection  -muscle spasms, tightening, or twitches  -numbness or tingling  -skin blisters or bumps, or is dry, peels, or red  -slow healing or unexplained pain in the mouth or jaw  -unusual bleeding or bruising  Side effects that usually do not require medical attention (Report these to your doctor or health care professional if they continue or are bothersome.):  -muscle pain  -stomach upset, gas  This list may not describe all possible side effects. Call your doctor for medical advice about side effects. You may report side effects to FDA at 1-800-FDA-1088.  Where should I keep my medicine?  This medicine is only given in a clinic, doctor's office, or other health care setting and will not be stored at home.  NOTE: This sheet is a summary. It may not cover all possible information. If you have questions about this medicine, talk to your doctor, pharmacist, or health care provider.      2016, Elsevier/Gold Standard. (2012-06-08 12:37:47)

## 2016-05-09 NOTE — Telephone Encounter (Signed)
Faxed Medical Necessity form twice to [phone] & called [phone] regarding case # [id number] per pt request b/c he received message that they needed medical necessity form.  Agent gave ph # [phone] & fax # [phone] that are specific to alectinib.  Form was refaxed.

## 2016-05-10 ENCOUNTER — Telehealth: Payer: Self-pay | Admitting: *Deleted

## 2016-05-10 NOTE — Telephone Encounter (Signed)
Received confirmation from [place] to [facility] of approval for pt to receive Alecensa & they will coordinate with the pt .  Approval good for 1 year or until therapy d/c or pt obtains ins or fails to meet requirements.  Pt informed. Case # is [id number] & pt ID [id number]

## 2016-05-28 ENCOUNTER — Other Ambulatory Visit: Payer: Self-pay | Admitting: *Deleted

## 2016-05-28 DIAGNOSIS — C3492 Malignant neoplasm of unspecified part of left bronchus or lung: Secondary | ICD-10-CM

## 2016-05-28 MED ORDER — ALECTINIB HCL 150 MG PO CAPS: 600.0000 mg | ORAL_CAPSULE | Freq: Two times a day (BID) | ORAL | Status: DC

## 2016-05-31 ENCOUNTER — Telehealth: Payer: Self-pay | Admitting: Hematology

## 2016-05-31 NOTE — Telephone Encounter (Signed)
s.w. pt wife and advised on 6.13 appt moved to earlier time

## 2016-06-03 ENCOUNTER — Ambulatory Visit (HOSPITAL_COMMUNITY)
Admission: RE | Admit: 2016-06-03 | Discharge: 2016-06-03 | Disposition: A | Discharge status: Home / Self Care | Payer: Commercial Managed Care - PPO | Source: Ambulatory Visit | Attending: Hematology | Admitting: Hematology

## 2016-06-03 DIAGNOSIS — C3492 Malignant neoplasm of unspecified part of left bronchus or lung: Secondary | ICD-10-CM | POA: Diagnosis not present

## 2016-06-03 DIAGNOSIS — C7951 Secondary malignant neoplasm of bone: Secondary | ICD-10-CM | POA: Insufficient documentation

## 2016-06-03 LAB — GLUCOSE, CAPILLARY: Glucose-Capillary: 155 mg/dL — ABNORMAL HIGH (ref 65–99)

## 2016-06-03 MED ORDER — FLUDEOXYGLUCOSE F - 18 (FDG) INJECTION
14.9000 | Freq: Once | INTRAVENOUS | Status: AC | PRN
  Administered 2016-06-03: 14.9 via INTRAVENOUS

## 2016-06-04 ENCOUNTER — Encounter: Payer: Self-pay | Admitting: Hematology

## 2016-06-04 ENCOUNTER — Ambulatory Visit (HOSPITAL_BASED_OUTPATIENT_CLINIC_OR_DEPARTMENT_OTHER): Payer: Commercial Managed Care - PPO

## 2016-06-04 ENCOUNTER — Other Ambulatory Visit: Payer: Commercial Managed Care - PPO

## 2016-06-04 ENCOUNTER — Other Ambulatory Visit (HOSPITAL_BASED_OUTPATIENT_CLINIC_OR_DEPARTMENT_OTHER): Payer: Commercial Managed Care - PPO

## 2016-06-04 ENCOUNTER — Ambulatory Visit
Admission: RE | Admit: 2016-06-04 | Discharge: 2016-06-04 | Disposition: A | Discharge status: Home / Self Care | Payer: Commercial Managed Care - PPO | Source: Ambulatory Visit | Attending: Radiation Oncology | Admitting: Radiation Oncology

## 2016-06-04 ENCOUNTER — Ambulatory Visit (HOSPITAL_BASED_OUTPATIENT_CLINIC_OR_DEPARTMENT_OTHER): Payer: Commercial Managed Care - PPO | Admitting: Hematology

## 2016-06-04 ENCOUNTER — Telehealth: Payer: Self-pay | Admitting: Hematology

## 2016-06-04 ENCOUNTER — Encounter: Payer: Commercial Managed Care - PPO | Admitting: Hematology

## 2016-06-04 ENCOUNTER — Ambulatory Visit: Payer: Commercial Managed Care - PPO

## 2016-06-04 VITALS — BP 140/58 | HR 75 | Temp 98.5°F | Resp 18 | Ht 72.0 in | Wt 316.6 lb

## 2016-06-04 DIAGNOSIS — I10 Essential (primary) hypertension: Secondary | ICD-10-CM

## 2016-06-04 DIAGNOSIS — C7951 Secondary malignant neoplasm of bone: Secondary | ICD-10-CM

## 2016-06-04 DIAGNOSIS — C3492 Malignant neoplasm of unspecified part of left bronchus or lung: Secondary | ICD-10-CM

## 2016-06-04 DIAGNOSIS — C787 Secondary malignant neoplasm of liver and intrahepatic bile duct: Secondary | ICD-10-CM

## 2016-06-04 DIAGNOSIS — C3432 Malignant neoplasm of lower lobe, left bronchus or lung: Secondary | ICD-10-CM | POA: Diagnosis not present

## 2016-06-04 DIAGNOSIS — F329 Major depressive disorder, single episode, unspecified: Secondary | ICD-10-CM

## 2016-06-04 DIAGNOSIS — C7949 Secondary malignant neoplasm of other parts of nervous system: Principal | ICD-10-CM

## 2016-06-04 DIAGNOSIS — D630 Anemia in neoplastic disease: Secondary | ICD-10-CM

## 2016-06-04 DIAGNOSIS — E1169 Type 2 diabetes mellitus with other specified complication: Secondary | ICD-10-CM

## 2016-06-04 DIAGNOSIS — C7931 Secondary malignant neoplasm of brain: Secondary | ICD-10-CM

## 2016-06-04 DIAGNOSIS — Z86718 Personal history of other venous thrombosis and embolism: Secondary | ICD-10-CM

## 2016-06-04 DIAGNOSIS — D649 Anemia, unspecified: Secondary | ICD-10-CM

## 2016-06-04 DIAGNOSIS — E669 Obesity, unspecified: Secondary | ICD-10-CM

## 2016-06-04 LAB — COMPREHENSIVE METABOLIC PANEL
ALT: 16 U/L (ref 0–55)
AST: 14 U/L (ref 5–34)
Albumin: 3.1 g/dL — ABNORMAL LOW (ref 3.5–5.0)
Alkaline Phosphatase: 77 U/L (ref 40–150)
Anion Gap: 6 mEq/L (ref 3–11)
BILIRUBIN TOTAL: 0.38 mg/dL (ref 0.20–1.20)
BUN: 25.6 mg/dL (ref 7.0–26.0)
CO2: 30 meq/L — AB (ref 22–29)
Calcium: 8.6 mg/dL (ref 8.4–10.4)
Chloride: 101 mEq/L (ref 98–109)
Creatinine: 1.3 mg/dL (ref 0.7–1.3)
EGFR: 60 mL/min/{1.73_m2} — AB (ref >90)
GLUCOSE: 296 mg/dL — AB (ref 70–140)
Potassium: 4.3 mEq/L (ref 3.5–5.1)
SODIUM: 137 meq/L (ref 136–145)
TOTAL PROTEIN: 5.9 g/dL — AB (ref 6.4–8.3)

## 2016-06-04 LAB — CBC & DIFF AND RETIC
BASO%: 0.2 % (ref 0.0–2.0)
Basophils Absolute: 0 10*3/uL (ref 0.0–0.1)
EOS ABS: 0.1 10*3/uL (ref 0.0–0.5)
EOS%: 1.5 % (ref 0.0–7.0)
HCT: 33.9 % — ABNORMAL LOW (ref 38.4–49.9)
HGB: 11.1 g/dL — ABNORMAL LOW (ref 13.0–17.1)
IMMATURE RETIC FRACT: 19.8 % — AB (ref 3.00–10.60)
LYMPH%: 27.7 % (ref 14.0–49.0)
MCH: 31.1 pg (ref 27.2–33.4)
MCHC: 32.7 g/dL (ref 32.0–36.0)
MCV: 95 fL (ref 79.3–98.0)
MONO#: 0.6 10*3/uL (ref 0.1–0.9)
MONO%: 6.7 % (ref 0.0–14.0)
NEUT%: 63.9 % (ref 39.0–75.0)
NEUTROS ABS: 5.9 10*3/uL (ref 1.5–6.5)
Platelets: 171 10*3/uL (ref 140–400)
RBC: 3.57 10*6/uL — AB (ref 4.20–5.82)
RDW: 14.1 % (ref 11.0–14.6)
RETIC %: 2.09 % — AB (ref 0.80–1.80)
RETIC CT ABS: 74.61 10*3/uL (ref 34.80–93.90)
WBC: 9.2 10*3/uL (ref 4.0–10.3)
lymph#: 2.5 10*3/uL (ref 0.9–3.3)

## 2016-06-04 MED ORDER — DENOSUMAB 120 MG/1.7ML ~~LOC~~ SOLN
120.0000 mg | Freq: Once | SUBCUTANEOUS | Status: AC
  Administered 2016-06-04: 120 mg via SUBCUTANEOUS
  Filled 2016-06-04: qty 1.7

## 2016-06-04 MED ORDER — GADOBENATE DIMEGLUMINE 529 MG/ML IV SOLN
20.0000 mL | Freq: Once | INTRAVENOUS | Status: AC | PRN
  Administered 2016-06-04: 20 mL via INTRAVENOUS

## 2016-06-04 NOTE — Patient Instructions (Signed)
Denosumab injection  What is this medicine?  DENOSUMAB (den oh sue mab) slows bone breakdown. Prolia is used to treat osteoporosis in women after menopause and in men. Xgeva is used to prevent bone fractures and other bone problems caused by cancer bone metastases. Xgeva is also used to treat giant cell tumor of the bone.  This medicine may be used for other purposes; ask your health care provider or pharmacist if you have questions.  What should I tell my health care provider before I take this medicine?  They need to know if you have any of these conditions:  -dental disease  -eczema  -infection or history of infections  -kidney disease or on dialysis  -low blood calcium or vitamin D  -malabsorption syndrome  -scheduled to have surgery or tooth extraction  -taking medicine that contains denosumab  -thyroid or parathyroid disease  -an unusual reaction to denosumab, other medicines, foods, dyes, or preservatives  -pregnant or trying to get pregnant  -breast-feeding  How should I use this medicine?  This medicine is for injection under the skin. It is given by a health care professional in a hospital or clinic setting.  If you are getting Prolia, a special MedGuide will be given to you by the pharmacist with each prescription and refill. Be sure to read this information carefully each time.  For Prolia, talk to your pediatrician regarding the use of this medicine in children. Special care may be needed. For Xgeva, talk to your pediatrician regarding the use of this medicine in children. While this drug may be prescribed for children as young as 13 years for selected conditions, precautions do apply.  Overdosage: If you think you have taken too much of this medicine contact a poison control center or emergency room at once.  NOTE: This medicine is only for you. Do not share this medicine with others.  What if I miss a dose?  It is important not to miss your dose. Call your doctor or health care professional if you are  unable to keep an appointment.  What may interact with this medicine?  Do not take this medicine with any of the following medications:  -other medicines containing denosumab  This medicine may also interact with the following medications:  -medicines that suppress the immune system  -medicines that treat cancer  -steroid medicines like prednisone or cortisone  This list may not describe all possible interactions. Give your health care provider a list of all the medicines, herbs, non-prescription drugs, or dietary supplements you use. Also tell them if you smoke, drink alcohol, or use illegal drugs. Some items may interact with your medicine.  What should I watch for while using this medicine?  Visit your doctor or health care professional for regular checks on your progress. Your doctor or health care professional may order blood tests and other tests to see how you are doing.  Call your doctor or health care professional if you get a cold or other infection while receiving this medicine. Do not treat yourself. This medicine may decrease your body's ability to fight infection.  You should make sure you get enough calcium and vitamin D while you are taking this medicine, unless your doctor tells you not to. Discuss the foods you eat and the vitamins you take with your health care professional.  See your dentist regularly. Brush and floss your teeth as directed. Before you have any dental work done, tell your dentist you are receiving this medicine.  Do   not become pregnant while taking this medicine or for 5 months after stopping it. Women should inform their doctor if they wish to become pregnant or think they might be pregnant. There is a potential for serious side effects to an unborn child. Talk to your health care professional or pharmacist for more information.  What side effects may I notice from receiving this medicine?  Side effects that you should report to your doctor or health care professional as soon as  possible:  -allergic reactions like skin rash, itching or hives, swelling of the face, lips, or tongue  -breathing problems  -chest pain  -fast, irregular heartbeat  -feeling faint or lightheaded, falls  -fever, chills, or any other sign of infection  -muscle spasms, tightening, or twitches  -numbness or tingling  -skin blisters or bumps, or is dry, peels, or red  -slow healing or unexplained pain in the mouth or jaw  -unusual bleeding or bruising  Side effects that usually do not require medical attention (Report these to your doctor or health care professional if they continue or are bothersome.):  -muscle pain  -stomach upset, gas  This list may not describe all possible side effects. Call your doctor for medical advice about side effects. You may report side effects to FDA at 1-800-FDA-1088.  Where should I keep my medicine?  This medicine is only given in a clinic, doctor's office, or other health care setting and will not be stored at home.  NOTE: This sheet is a summary. It may not cover all possible information. If you have questions about this medicine, talk to your doctor, pharmacist, or health care provider.      2016, Elsevier/Gold Standard. (2012-06-08 12:37:47)

## 2016-06-04 NOTE — Progress Notes (Signed)
[place]  Telephone:(336) [phone] Fax:(336) [phone]  Clinic Follow Up Note   Patient Care Team: Walter Good. [name], MD as PCP - General (Internal Medicine) [name], MD as Consulting Physician (Hematology) 06/04/2016   CHIEF COMPLAINTS:  Follow up metastatic lung cancer  Oncology History   Metastatic lung cancer (metastasis from lung to other site)   Staging form: Lung, AJCC 7th Edition     Clinical: Stage IV (T3, N3, M1b) - Unsigned        Metastatic lung cancer (metastasis from lung to other site) ([place])   03/24/2015 Imaging brian MRI 1.9 cm high left frontal lobe mass with minimal surrounding edema   03/24/2015 Imaging CT showed Multiple low-density lesions in the liver worrisome for metastatic disease. Consolidative infiltrate in the left lower lobe with interstitial infiltrate in the left upper lobe. Single enlarged left hilar lymph node.   03/27/2015 Initial Diagnosis Metastatic lung cancer (metastasis from lung to other site)   03/27/2015 Pathology Results Metastatic adenocarcinoma, IHC positive for CK 7, TTF-1 and Napsin A. Negative for CK 20, CD X2, PSA, consistent with lung primary   03/27/2015 Miscellaneous Foundation One test showed (+) EML4-ALK fusion (variant 2), CDKN2A/B loss, SMAD4 R361H.    04/07/2015 Imaging PET scan showed hypermetabolic soft tissue infiltrates in the left lower lobe lung, bilateral mediastinal adenopathy,widespread metastatic disease, including to thoracic, low cervical nodes, liver and bones.   04/10/2015 - 04/10/2015 Radiation Therapy SRS to the brain met    04/27/2015 - 09/08/2015 Chemotherapy Crizotinib 28m bid, stopped due to disease preogression    08/08/2015 - 08/12/2015 [facility]  [facility] and [facility] of brain showed small hemorrhage. Lovenox was held,  and he had a IVC filter Placed. Lovenox was resumed one month later.    09/09/2015 -  Chemotherapy Alectinib 6072mbid    03/04/2016  Imaging Decreased in size of the region of abnormal enhancement in the left posterior frontal cortical region since the study of January, marked reduction in regional vasogenic edema. 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 no new lesions   03/21/2016 Imaging PET scan showed complete metabolic response, mild residual nodular scarring in the left lower lobe, residual hepatic lesion measuring up to 1.5 cm, multifocal sclerotic osseous lesions, without hypermetabolic of the above lesions.     HISTORY OF PRESENTING ILLNESS (03/29/2015):  Walter Oaks[age] male is here because of newly diagnosed metastatic lung cancer.  He has been in good health until 3 month ago, when he started having intermittent nausea, and gradual weight loss a total of 18 lbs since then, although some are intentional by cutting back diet. He denies any vomiting, abdominal pain or discomfort, or change of his bowel habit.  He presented with syncope, right after he noticed some titch of left face, and rolling over of his eyes. He will [name] the ground and lost consciousness. This was witnessed by his wife and other people, 911 was called and paramedic staff underwent CPR for a total of 30 minutes. The cardiac monitor shows that he was in asystole. [facility] He workup in the ambulance, and did not have any significant in neurology deficit afterwards. He was seen by cardiology in the neurology service, cardiac catheterization was negative for coronary artery disease. He underwent a CT of head which showed a 1.9 cm lesion in the left frontal lobe with surrounding edema.  CT of the chest reviewed no PE, but significant infiltrative consolidation in the left lower lobe, it was felt to be aspiration pneumonia. CT of the abdomen reviewed multiple diffuse liver metastasis. He underwent a liver biopsy which showed adenocarcinoma.   He was a discharge to home. He has been doing well  overall since hospital discharge. He has some residual pain from the rib fracture from CPR. He denies any cough, dyspnea, or other symptoms. He has mild fatigue and low appetite, which has been chronic since his gastric bypass surgery in 2011.   CURRENT THERAPY:  1.Alectinib 600 mg twice daily started on 09/11/2015 2. Xgeva every 4 weeks, changed to ever 3 month in 05/2016  INTERIM HISTORY: Walter returns for follow-up and monthly Xgeva injection. He is accompanied by his wife to the clinic today.  He restarted alectinib e a few days after he saw me last time.He has been tolerating very well. No new complaints. His dexamethasone was finally taken off, last dose 0.5 mg 2 days ago. He denies any headaches, or other neurological symptoms since he came off dexamethasone.  He has gained quite a bit of weight lately,  Probably related to steroids.He denies any significant pain, dyspnea, or other symptoms. He functions well, but not very physically active.  He had a 2 episodes of a small seizure ( staring, difficulty wording)  In the past 2 weeks,  Resolved quickly.  MEDICAL HISTORY:  Past Medical History  Diagnosis Date  . Diabetes mellitus without complication ([place])   . H/O gastric bypass     a. ~2011 at [facility].  . Acid reflux   . Seizures ([place]) 03/23/15  . Brain mass 03/24/15    ct head - left frontal lobe mass consistent with a brain metastasis  . Cancer ([place])     mass on lung, brain, sinuses, liver and bones    SURGICAL HISTORY: Past Surgical History  Procedure Laterality Date  . Left heart catheterization with coronary angiogram N/A 03/23/2015    Procedure: LEFT HEART CATHETERIZATION WITH CORONARY ANGIOGRAM;  Surgeon: Burnell Blanks, MD;  Location: [facility] CATH LAB;  Service: Cardiovascular;  Laterality: N/A;  . Gastric bypass  2011  . Liver biopsy  03/27/15  . Appendectomy    . Cholecystectomy    . Right knee osroscopy Right     SOCIAL HISTORY: History   Social History    . Marital Status: Unknown    Spouse Name: N/A  . Number of Children: 2  . Years of Education: N/A   Occupational History  . Barrister's clerk    Social History Main Topics  . Smoking status: Uses cigar   . Smokeless tobacco: Not on file  . Alcohol Use: 0.0 oz/week    0 Standard drinks or equivalent per week     Comment: Occasionally socially - once a week or less  . Drug Use: No  . Sexual Activity: Not on file   Other Topics Concern  . Not on file   Social History Narrative    FAMILY HISTORY: Family History  Problem Relation Age of Onset  . Valvular heart disease Father     H/o pig valve  . Cancer Father     skin cancer   . Sudden death Neg Hx     No family history of sudden cardiac death  . Breast cancer Mother   . Cancer Mother 42    breast cancer   . Cancer Brother 42    base  of tongue   . Cancer Maternal Uncle     throat cancer   . Cancer Maternal Uncle 90    colon cancer, and prostate cancer   . Cancer Cousin     lung cancer     ALLERGIES:  has No Known Allergies.  MEDICATIONS:  Current Outpatient Prescriptions  Medication Sig Dispense Refill  . alectinib (ALECENSA) 150 MG capsule Take 4 capsules (600 mg total) by mouth 2 (two) times daily. 240 capsule 3  . ALPRAZolam (XANAX) 0.5 MG tablet TAKE ONE TABLET BY MOUTH THREE TIMES A DAY AS NEEDED 90 tablet 0  . atorvastatin (LIPITOR) 40 MG tablet Take 40 mg by mouth at bedtime. Last Refill 08/22/2014    . blood glucose meter kit and supplies KIT Dispense based on patient and insurance preference. Use up to four times daily as directed. (FOR ICD-9 250.00, 250.01). 1 each 0  . Calcium Carbonate-Vitamin D (CALCIUM-VITAMIN D) 500-200 MG-UNIT tablet Take by mouth.    . dexamethasone (DECADRON) 4 MG tablet Take 85m (1/2) tab) daily with food, then taper as directed per Dr. SPearlie Oysterinstructions 30 tablet 0  . DULoxetine (CYMBALTA) 60 MG capsule Take 60 mg by mouth.    . enoxaparin (LOVENOX) 100 MG/ML injection Inject 1  mL (100 mg total) into the skin daily. 30 Syringe 3  . glipiZIDE (GLUCOTROL) 5 MG tablet Take 5 mg by mouth daily before breakfast.    . levETIRAcetam (KEPPRA) 750 MG tablet Take 2 tablets (1,500 mg total) by mouth 2 (two) times daily. 180 tablet 2  . metFORMIN (GLUCOPHAGE) 1000 MG tablet Take 1,000 mg by mouth 2 (two) times daily with a meal. Last refill 08/23/2014    . mirtazapine (REMERON) 45 MG tablet TAKE 1 TABLET (45 MG TOTAL) BY MOUTH AT BEDTIME. 30 tablet 2  . Multiple Vitamin (MULTI-VITAMINS) TABS Take by mouth.    .Marland Kitchenomeprazole (PRILOSEC) 20 MG capsule Take 20 mg by mouth daily. Last Refill 08/22/2014    . ondansetron (ZOFRAN) 8 MG tablet Take 1 tablet (8 mg total) by mouth every 8 (eight) hours as needed for nausea or vomiting. (Patient not taking: Reported on 04/11/2016) 30 tablet 3  . pentoxifylline (TRENTAL) 400 MG CR tablet Take 4049mtab daily x 1 week, then 40041mID; take with food 60 tablet 5  . traZODone (DESYREL) 50 MG tablet Take 50 mg by mouth at bedtime.    . vitamin E 400 UNIT capsule Take 400 IU daily x 1 week, then 400 IU BID 60 capsule 5   No current facility-administered medications for this visit.    REVIEW OF SYSTEMS:   Constitutional: Denies fevers, chills or abnormal night sweats, (+) fatigue stable  Eyes: Denies blurriness of vision, double vision or watery eyes Ears, nose, mouth, throat, and face: Denies mucositis or sore throat Respiratory: Denies cough, dyspnea or wheezes Cardiovascular: Denies palpitation, chest discomfort or lower extremity swelling Gastrointestinal:  Mild intermittent nausea, no heartburn or change in bowel habits Skin: Denies abnormal skin rashes Lymphatics: Denies new lymphadenopathy or easy bruising Neurological:Denies numbness, tingling or new weaknesses Behavioral/Psych: Mood is stable, no new changes  All other systems were reviewed with the patient and are negative.  PHYSICAL EXAMINATION: ECOG PERFORMANCE STATUS: 1  Filed  Vitals:   06/04/16 1239  BP: 140/58  Pulse: 75  Temp: 98.5 F (36.9 C)  Resp: 18   Filed Weights   06/04/16 1239  Weight: 316 lb 9.6 oz (143.609 kg)    GENERAL:alert,  no distress and comfortable SKIN: skin color, texture, turgor are normal, no rashes or significant lesions EYES: normal, conjunctiva are pink and non-injected, sclera clear OROPHARYNX:no exudate, no erythema and lips, buccal mucosa, and tongue normal  NECK: supple, thyroid normal size, non-tender, without nodularity LYMPH:  no palpable lymphadenopathy in the cervical, axillary or inguinal LUNGS: clear to auscultation and percussion with normal breathing effort HEART: regular rate & rhythm and no murmurs and no lower extremity edema ABDOMEN:abdomen soft, non-tender and normal bowel sounds Musculoskeletal:no cyanosis of digits and no clubbing  PSYCH: alert & oriented x 3 with fluent speech NEURO: no focal motor/sensory deficits  LABORATORY DATA:  I have reviewed the data as listed CBC Latest Ref Rng 06/04/2016 05/09/2016 04/11/2016  WBC 4.0 - 10.3 10e3/uL 9.2 9.1 9.4  Hemoglobin 13.0 - 17.1 g/dL 11.1(L) 11.4(L) 11.9(L)  Hematocrit 38.4 - 49.9 % 33.9(L) 35.5(L) 36.0(L)  Platelets 140 - 400 10e3/uL 171 184 190    CMP Latest Ref Rng 06/04/2016 05/09/2016 04/11/2016  Glucose 70 - 140 mg/dl 296(H) 127 110  BUN 7.0 - 26.0 mg/dL 25.6 23.2 24.0  Creatinine 0.7 - 1.3 mg/dL 1.3 1.1 1.0  Sodium 136 - 145 mEq/L 137 140 142  Potassium 3.5 - 5.1 mEq/L 4.3 4.1 4.1  CO2 22 - 29 mEq/L 30(H) 27 29  Calcium 8.4 - 10.4 mg/dL 8.6 8.5 8.3(L)  Total Protein 6.4 - 8.3 g/dL 5.9(L) 5.9(L) 5.9(L)  Total Bilirubin 0.20 - 1.20 mg/dL 0.38 0.38 0.50  Alkaline Phos 40 - 150 U/L 77 61 71  AST 5 - 34 U/L 14 17 14   ALT 0 - 55 U/L 16 21 20     PATHOLOGY REPORT: Liver, needle/core biopsy, right lobe 03/27/2015  - METASTATIC ADENOCARCINOMA, SEE COMMENT. Microscopic Comment The adenocarcinoma demonstrates the following immunophenotype Cytokeratin  7 - strong diffuse expression. Cytokeratin 20 - negative expression. TTF-1 - patchy moderate strong expression. Napsin A - strong diffuse expression. CDX-2 - negative expression. PSA - negative expression. Overall, the morphology and immunophenotype are that of metastatic adenocarcinoma, primary to lung. There is tumor available for ancillary tumor testing. The case is reviewed with Dr. Lyndon Code who concurs. The case was discussed with Dr Burr Medico on 03/29/2015. (CRR:gt:ecj, 03/29/15)  RADIOGRAPHIC STUDIES: I have personally reviewed the radiological images as listed and agreed with the findings in the report.  PET  06/03/2016 IMPRESSION: 1. No evidence of hypermetabolic residual or recurrent disease. 2. Similar non hypermetabolic widespread sclerotic osseous metastasis.  Brain MRI with and without contrast 03/04/2016 IMPRESSION: Decrease in size of the region of abnormal enhancement in the left posterior frontal cortical region since the study of January. Marked reduction in regional vasogenic edema. 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 No finding to suggest residual or recurrent tumor at this time. No new lesions.   ASSESSMENT & PLAN:  60 year old Caucasian male with minimal smoking history, obesity status post gastric bypass surgery, diabetes, presented with cardiac arrest and presumed seizure. Imaging study showed left lower lobe infiltrative consultation, left hilar adenopathy, diffuse liver metastasis and left front lobe brain metastasis.  1. 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 Images were reviewed in person. -We discussed that his cancer is incurable at this stage, but treatable. We now have a 3 ALK inhibitors available for this type of lung cancer, with  very good  response rate and significantly increase patient's survival. -I discussed his recent restaging PET scan findings from 06/03/2016. It showed complete metabolic response to Alectinib, small liver and sclerotic bone lesions are not hypermetabolic. He is tolerating Alectinib very well, and he is very excited about the excellent response -continue Alectinib,  He is tolerating very well, no significant side effects. -We'll repeat next scan in 3 months  2. Brain mets -s/p SRS, follow-up with radiation oncology -His restaging brain MRI from 03/04/2016 showed decreasing size of his abnormal enhancement in the left posterior frontal cortical region, marked decrease in regional vasogenic edema, consistent with radiation necrosis. -He is of dexamethasone now, doing well. -Alectinib has some CNS penetration, hopefully will control his brain mets also  -He had restaging brain MRI this morning, the reportwas not back when I saw him in the office, I reviewed image,seems the brain lesion/radiation necrosis has improved. He is scheduled to see Dr. Vertell Limber in in a few days.  3. Bone mets -on Xgeva,  He has received 1 year monthly treatment,  We'll switch to every 3 months from now -He is taking calcium 2-3 tab daily and vitamin D.  4. history of cardiac arrest -Likely related to his breathing metastasis and seizure -Cardiac cath was negative for coronary artery disease  5. Seizure, secondary to brain metastasis -Continue Keppra, his does was increased in 10/2015  -He will follow up with neurologist in [place]. He has not driven since the seizure.  He had 2 small episodes of seizure in the past few weeks.  6.  diabetes  -Continue follow-up with primary care physician.  -We discussed is that steroids can cause hyperglycemia, his blood glucose was 127 today.  7. DVT and Pulmonary embolism, diagnosed on 06/15/2015 -Probably related to his underline malignancy -We previously discussed the option of  anticoagulation, which include Lovenox and Coumadin. Low molecular weight heparin (such as Lovenox) works better than coumadin to prevent recurrent thrombosis in cancer patients [facility] [phone]), and I recommend him continue lovenox indefinitely unless there is a contraindication such as recurrent bleeding. -he is on low dose of lovenox 165m daily now, due to the concern of tumor hemorrhage in brain. This was the concencus from CNS tumor board discussion.   9. [facility] -continue mirtazapine 469mat bedtime.   10. Anemia -mild and stable, developed since he started Alectinib, likely related  -will follow up closely  11. Hypocalcemia -secondary to Xgeva  -continue calcium 3 tab daily  -Hold Xgeva if correct calcium<8  12. Morbid obesity  -He has gained a lot of weight from steroids. He is off steroids now. He agrees to watch his diet closely.  Follow-up -continue Alectinib  -Xgeva today and change to every 3 month -RTC in 6 weeks follow up and lab, and restaging PET in 3 months    All questions were answered. The patient knows to call the clinic with any problems, questions or concerns. I spent 25 minutes counseling the patient face to face. The total time spent in the appointment was 30 minutes and more than 50% was on counseling.     FeTruitt MerleMD 06/04/2016    5:29 PM

## 2016-06-04 NOTE — Telephone Encounter (Signed)
Gave and printed appt sched and avs for pt for July  °

## 2016-06-05 ENCOUNTER — Telehealth: Payer: Self-pay | Admitting: Neurology

## 2016-06-05 NOTE — Progress Notes (Signed)
This encounter was created in error - please disregard.

## 2016-06-05 NOTE — Telephone Encounter (Signed)
I spoke to patient's wife. Patient saw Dr. Vertell Limber today. Patient was taking '1500mg'$  BID but Dr. Vertell Limber decreased to '1000mg'$  BID today. Patient has appt in September with Dr. Jannifer Franklin (she originally that he had appt today with him). Wife did not feel that patient needed to come in sooner and wanted to keep appt in September, she reports that patient is doing well. I advised her to call back sooner if needed.

## 2016-06-05 NOTE — Telephone Encounter (Signed)
I talked with Dr. Vertell Limber in neurosurgery who saw the patient in follow-up and patient had a follow-up brain MRI which per Dr. Vertell Limber is improved. Nevertheless, patient reports interim seizures. He is currently on Keppra generic, 750 mg 3 times a day. We mutually agreed to increase this to 1500 mg twice daily (2 pills bid). For some reason, this is how the prescription is actually written for, but per Dr. Vertell Limber, patient is taking 1 pill tid.  Furthermore, I suggested a sooner than scheduled follow-up appointment, we will try to arrange with one of our nurse practitioners and patient has an appointment with Dr. Jannifer Franklin in September which he can keep as well.  Anderson Malta, please facilitate a follow-up appointment with nurse practitioner in the next 2-4 weeks. Also, can you please verify how the patient has been taking his Keppra? Is written for 750 mg strength, he is supposed to be on 2 pills twice daily, has he been taking one pill 3 times a day?

## 2016-06-06 NOTE — Telephone Encounter (Signed)
I LM for wife to call back and confirm Keppra levels.

## 2016-06-06 NOTE — Telephone Encounter (Signed)
Anderson Malta,  To summarize below messages. Dr. Jannifer Franklin prescribes the patient's Keppra. He saw Dr. Vertell Limber 6/14 and told him that he is still have seizure activity. Dr. Vertell Limber called Dr. Rexene Alberts (on call doctor) to discuss going up on dosage. Dr. Melven Sartorius records and our records do not match on Keppra dosage. I called the wife to confirm dosage, she told me that Dr. Vertell Limber decreased his Keppra from '1500mg'$  BID to '1000mg'$  BID. I called Dr. Melven Sartorius office today and they state, Dr. Melven Sartorius notes indicate that he increased Keppra from '500mg'$  BID to '1000mg'$  BID.  I left a message for wife to call back and confirm his dosage and try to clear this up. If patient is having seizures, his Keppra should be increased. And Dr. Rexene Alberts recommends patient come in sooner and see NP.

## 2016-06-06 NOTE — Telephone Encounter (Signed)
I called Dr. Melven Sartorius office and left message for CMA to call back. There are conflicting stories from wife and Dr. Melven Sartorius office on [place] changes.

## 2016-06-06 NOTE — Telephone Encounter (Signed)
Dr. Melven Sartorius office called back and stated that in their notes patient was originally taking keppra 500 mg BID, and Dr. Vertell Limber advised patient to go up to 1000 mg BID. I will call wife back to clarify.

## 2016-06-07 ENCOUNTER — Other Ambulatory Visit: Payer: Commercial Managed Care - PPO

## 2016-06-07 NOTE — Telephone Encounter (Addendum)
Called and spoke to pt. He reports that he had 3 breakthrough seizures and Dr. Jannifer Franklin had increased his Keppra to 1500 mg twice a day. Says that he is currently taking 1500 mg BID. However, he is unsure if he has 500, 750 or 1000 mg tabs. Encouraged pt to continue 1500 mg twice a day. Appt scheduled on Monday w/ Dr. Jannifer Franklin to discuss further.

## 2016-06-07 NOTE — Telephone Encounter (Signed)
Talked to [employer]. Pt picked up last rx for Keppra 750 mg BID (after OV) on 03/21/16. He still has refills available.

## 2016-06-10 ENCOUNTER — Encounter: Payer: Self-pay | Admitting: Neurology

## 2016-06-10 ENCOUNTER — Other Ambulatory Visit: Payer: Self-pay | Admitting: Radiation Therapy

## 2016-06-10 ENCOUNTER — Ambulatory Visit (INDEPENDENT_AMBULATORY_CARE_PROVIDER_SITE_OTHER): Payer: Commercial Managed Care - PPO | Admitting: Neurology

## 2016-06-10 VITALS — BP 122/86 | HR 68 | Ht 72.0 in | Wt 314.0 lb

## 2016-06-10 DIAGNOSIS — C7931 Secondary malignant neoplasm of brain: Secondary | ICD-10-CM

## 2016-06-10 DIAGNOSIS — R569 Unspecified convulsions: Secondary | ICD-10-CM | POA: Diagnosis not present

## 2016-06-10 DIAGNOSIS — G939 Disorder of brain, unspecified: Secondary | ICD-10-CM

## 2016-06-10 DIAGNOSIS — C7949 Secondary malignant neoplasm of other parts of nervous system: Principal | ICD-10-CM

## 2016-06-10 DIAGNOSIS — G9389 Other specified disorders of brain: Secondary | ICD-10-CM

## 2016-06-10 MED ORDER — CARBAMAZEPINE 200 MG PO TABS: ORAL_TABLET | ORAL | Status: AC

## 2016-06-10 NOTE — Progress Notes (Signed)
Reason for visit: Seizures  Walter Wilkins. is an 60 y.o. male  History of present illness:  Walter Wilkins is a 60 year old right-handed white male with a history of metastatic lung cancer to the brain. The patient has seizures secondary to this. The patient has had about 3 seizures over the last 3 weeks, the last seizure was a week ago. The patient is having minor events. He will have the sensation of tongue swelling, he is unable to talk during the event. The patient will let the event pass after several minutes, and he does well. He does not fall down. The patient has no jerking or twitching. He has recently been taken off of the Decadron. He apparently had his Keppra dose reduced to 1000 mg twice daily from the 1500 mg twice daily dose within the last week, but the seizures were occurring on the elevated dose as well. He returns to this office for an evaluation. He indicates that stress may bring on the seizure. He does not operate a motor vehicle.  Past Medical History  Diagnosis Date  . Diabetes mellitus without complication ([place])   . H/O gastric bypass     a. ~2011 at [facility].  . Acid reflux   . Seizures ([place]) 03/23/15  . Brain mass 03/24/15    ct head - left frontal lobe mass consistent with a brain metastasis  . Cancer ([place])     mass on lung, brain, sinuses, liver and bones    Past Surgical History  Procedure Laterality Date  . Left heart catheterization with coronary angiogram N/A 03/23/2015    Procedure: LEFT HEART CATHETERIZATION WITH CORONARY ANGIOGRAM;  Surgeon: Burnell Blanks, MD;  Location: [facility] CATH LAB;  Service: Cardiovascular;  Laterality: N/A;  . Gastric bypass  2011  . Liver biopsy  03/27/15  . Appendectomy    . Cholecystectomy    . Right knee osroscopy Right     Family History  Problem Relation Age of Onset  . Valvular heart disease Father     H/o pig valve  . Cancer Father     skin cancer   . Sudden death Neg Hx     No family history of  sudden cardiac death  . Breast cancer Mother   . Cancer Mother 51    breast cancer   . Cancer Brother 42    base of tongue   . Cancer Maternal Uncle     throat cancer   . Cancer Maternal Uncle 90    colon cancer, and prostate cancer   . Cancer Cousin     lung cancer     Social history:  reports that he has never smoked. He has never used smokeless tobacco. He reports that he drinks alcohol. He reports that he does not use illicit drugs.   No Known Allergies  Medications:  Prior to Admission medications   Medication Sig Start Date End Date Taking? Authorizing Provider  alectinib (ALECENSA) 150 MG capsule Take 4 capsules (600 mg total) by mouth 2 (two) times daily. 05/28/16  Yes [name], MD  ALPRAZolam Duanne Moron) 0.5 MG tablet TAKE ONE TABLET BY MOUTH THREE TIMES A DAY AS NEEDED 05/08/16  Yes [name], MD  atorvastatin (LIPITOR) 40 MG tablet Take 40 mg by mouth at bedtime. Last Refill 08/22/2014   Yes Historical Provider, MD  blood glucose meter kit and supplies KIT Dispense based on patient and insurance preference. Use up to four times daily as  directed. (FOR ICD-9 250.00, 250.01). 03/28/15  Yes Orson Eva, MD  Calcium Carbonate-Vitamin D (CALCIUM-VITAMIN D) 500-200 MG-UNIT tablet Take by mouth. 06/19/15 06/18/16 Yes Historical Provider, MD  DULoxetine (CYMBALTA) 60 MG capsule Take 60 mg by mouth.   Yes Historical Provider, MD  enoxaparin (LOVENOX) 100 MG/ML injection Inject 1 mL (100 mg total) into the skin daily. 04/11/16  Yes [name], MD  glipiZIDE (GLUCOTROL) 5 MG tablet Take 5 mg by mouth daily before breakfast.   Yes Historical Provider, MD  levETIRAcetam (KEPPRA) 750 MG tablet Take 2 tablets (1,500 mg total) by mouth 2 (two) times daily. 03/21/16  Yes Kathrynn Ducking, MD  metFORMIN (GLUCOPHAGE) 1000 MG tablet  05/08/16  Yes Historical Provider, MD  mirtazapine (REMERON) 45 MG tablet TAKE 1 TABLET (45 MG TOTAL) BY MOUTH AT BEDTIME. 02/16/16  Yes [name], MD  Multiple Vitamin  (MULTI-VITAMINS) TABS Take by mouth.   Yes Historical Provider, MD  omeprazole (PRILOSEC) 20 MG capsule Take 20 mg by mouth daily. Last Refill 08/22/2014   Yes Historical Provider, MD  ondansetron (ZOFRAN) 8 MG tablet Take 1 tablet (8 mg total) by mouth every 8 (eight) hours as needed for nausea or vomiting. 07/03/15  Yes [name], MD  pentoxifylline (TRENTAL) 400 MG CR tablet Take 474m tab daily x 1 week, then 4020mBID; take with food 01/08/16  Yes SaEppie GibsonMD  traZODone (DESYREL) 50 MG tablet Take 50 mg by mouth at bedtime.   Yes Historical Provider, MD  vitamin E 400 UNIT capsule Take 400 IU daily x 1 week, then 400 IU BID 01/08/16  Yes SaEppie GibsonMD  carbamazepine (TEGRETOL) 200 MG tablet 1/2 tablet twice a day for 2 weeks, then take 1 tablet twice a day 06/10/16   ChKathrynn DuckingMD    ROS:  Out of a complete 14 system review of symptoms, the patient complains only of the following symptoms, and all other reviewed systems are negative.  Nausea Numbness, seizures Depression, anxiety  Blood pressure 122/86, pulse 68, height 6' (1.829 m), weight 314 lb (142.429 kg).  Physical Exam  General: The patient is alert and cooperative at the time of the examination. The patient is markedly obese.  Skin: No significant peripheral edema is noted.   Neurologic Exam  Mental status: The patient is alert and oriented x 3 at the time of the examination. The patient has apparent normal recent and remote memory, with an apparently normal attention span and concentration ability.   Cranial nerves: Facial symmetry is present. Speech is normal, no aphasia or dysarthria is noted. Extraocular movements are full. Visual fields are full.  Motor: The patient has good strength in all 4 extremities.  Sensory examination: Soft touch sensation is symmetric on the face, arms, and legs.  Coordination: The patient has good finger-nose-finger and heel-to-shin bilaterally.  Gait and station: The  patient has a normal gait. Tandem gait is slightly unsteady. Romberg is negative. No drift is seen.  Reflexes: Deep tendon reflexes are symmetric.   Assessment/Plan:  1. Metastatic brain tumor  2. Seizures  The patient has had 3 seizures in the last 3 weeks. The patient will continue the Keppra to 1500 mg twice daily dose. We will add low-dose carbamazepine taking 100 mg twice daily for 2 weeks, then go to 200 mg twice daily. I will call him in 4 weeks to recheck blood work. The patient will follow-up otherwise for his next scheduled appointment in September.  C.Bonner Puna  Jannifer Franklin MD 06/10/2016 6:35 PM  [facility] [address] [place], [place] [address]  Phone [phone] Fax [phone]

## 2016-06-10 NOTE — Patient Instructions (Signed)
    Tegretol (carbamazepine) may result in dizziness, gait instability, cognitive slowing, or drowsiness. Sometimes, and allergic rash may occur, or a photosensitive rash may occur. If any significant side effects are noted, please contact our office.

## 2016-06-18 ENCOUNTER — Other Ambulatory Visit: Payer: Self-pay | Admitting: Hematology

## 2016-07-02 ENCOUNTER — Other Ambulatory Visit: Payer: Self-pay | Admitting: Hematology

## 2016-07-04 ENCOUNTER — Other Ambulatory Visit: Payer: Self-pay | Admitting: *Deleted

## 2016-07-04 MED ORDER — ALPRAZOLAM 0.5 MG PO TABS: ORAL_TABLET | ORAL | Status: DC

## 2016-07-12 ENCOUNTER — Telehealth: Payer: Self-pay | Admitting: Neurology

## 2016-07-12 DIAGNOSIS — Z5181 Encounter for therapeutic drug level monitoring: Secondary | ICD-10-CM

## 2016-07-12 NOTE — Telephone Encounter (Signed)
I called the patient, he is to come in to get blood work done well on the carbamazepine. The orders have been placed.

## 2016-07-16 ENCOUNTER — Ambulatory Visit: Payer: Commercial Managed Care - PPO

## 2016-07-16 ENCOUNTER — Ambulatory Visit: Payer: Commercial Managed Care - PPO | Admitting: Hematology

## 2016-07-16 ENCOUNTER — Other Ambulatory Visit: Payer: Commercial Managed Care - PPO

## 2016-08-05 ENCOUNTER — Other Ambulatory Visit (HOSPITAL_BASED_OUTPATIENT_CLINIC_OR_DEPARTMENT_OTHER): Payer: Commercial Managed Care - PPO

## 2016-08-05 ENCOUNTER — Ambulatory Visit (HOSPITAL_BASED_OUTPATIENT_CLINIC_OR_DEPARTMENT_OTHER): Payer: Commercial Managed Care - PPO | Admitting: Hematology

## 2016-08-05 ENCOUNTER — Other Ambulatory Visit: Payer: Self-pay | Admitting: Radiation Oncology

## 2016-08-05 ENCOUNTER — Other Ambulatory Visit (INDEPENDENT_AMBULATORY_CARE_PROVIDER_SITE_OTHER): Payer: Self-pay

## 2016-08-05 ENCOUNTER — Ambulatory Visit: Payer: Commercial Managed Care - PPO

## 2016-08-05 ENCOUNTER — Telehealth: Payer: Self-pay | Admitting: Hematology

## 2016-08-05 VITALS — BP 141/52 | HR 69 | Temp 98.7°F | Resp 18 | Ht 72.0 in | Wt 320.8 lb

## 2016-08-05 DIAGNOSIS — C7931 Secondary malignant neoplasm of brain: Secondary | ICD-10-CM

## 2016-08-05 DIAGNOSIS — C3492 Malignant neoplasm of unspecified part of left bronchus or lung: Secondary | ICD-10-CM | POA: Diagnosis not present

## 2016-08-05 DIAGNOSIS — C7951 Secondary malignant neoplasm of bone: Secondary | ICD-10-CM

## 2016-08-05 DIAGNOSIS — Z0289 Encounter for other administrative examinations: Secondary | ICD-10-CM

## 2016-08-05 DIAGNOSIS — R569 Unspecified convulsions: Secondary | ICD-10-CM

## 2016-08-05 DIAGNOSIS — D630 Anemia in neoplastic disease: Secondary | ICD-10-CM

## 2016-08-05 DIAGNOSIS — F329 Major depressive disorder, single episode, unspecified: Secondary | ICD-10-CM

## 2016-08-05 DIAGNOSIS — Z86718 Personal history of other venous thrombosis and embolism: Secondary | ICD-10-CM

## 2016-08-05 DIAGNOSIS — Z86711 Personal history of pulmonary embolism: Secondary | ICD-10-CM

## 2016-08-05 DIAGNOSIS — C787 Secondary malignant neoplasm of liver and intrahepatic bile duct: Secondary | ICD-10-CM

## 2016-08-05 DIAGNOSIS — E1169 Type 2 diabetes mellitus with other specified complication: Secondary | ICD-10-CM

## 2016-08-05 DIAGNOSIS — Z5181 Encounter for therapeutic drug level monitoring: Secondary | ICD-10-CM

## 2016-08-05 DIAGNOSIS — I10 Essential (primary) hypertension: Secondary | ICD-10-CM

## 2016-08-05 DIAGNOSIS — E669 Obesity, unspecified: Secondary | ICD-10-CM

## 2016-08-05 DIAGNOSIS — D649 Anemia, unspecified: Secondary | ICD-10-CM

## 2016-08-05 LAB — CBC & DIFF AND RETIC
BASO%: 0.5 % (ref 0.0–2.0)
Basophils Absolute: 0 10*3/uL (ref 0.0–0.1)
EOS ABS: 0.6 10*3/uL — AB (ref 0.0–0.5)
EOS%: 6.6 % (ref 0.0–7.0)
HCT: 33.6 % — ABNORMAL LOW (ref 38.4–49.9)
HEMOGLOBIN: 10.9 g/dL — AB (ref 13.0–17.1)
Immature Retic Fract: 23.3 % — ABNORMAL HIGH (ref 3.00–10.60)
LYMPH%: 22 % (ref 14.0–49.0)
MCH: 29.2 pg (ref 27.2–33.4)
MCHC: 32.4 g/dL (ref 32.0–36.0)
MCV: 90.1 fL (ref 79.3–98.0)
MONO#: 0.7 10*3/uL (ref 0.1–0.9)
MONO%: 7.8 % (ref 0.0–14.0)
NEUT%: 63.1 % (ref 39.0–75.0)
NEUTROS ABS: 5.3 10*3/uL (ref 1.5–6.5)
PLATELETS: 208 10*3/uL (ref 140–400)
RBC: 3.73 10*6/uL — ABNORMAL LOW (ref 4.20–5.82)
RDW: 15 % — ABNORMAL HIGH (ref 11.0–14.6)
Retic %: 2.51 % — ABNORMAL HIGH (ref 0.80–1.80)
Retic Ct Abs: 93.62 10*3/uL (ref 34.80–93.90)
WBC: 8.4 10*3/uL (ref 4.0–10.3)
lymph#: 1.9 10*3/uL (ref 0.9–3.3)

## 2016-08-05 LAB — COMPREHENSIVE METABOLIC PANEL
ALBUMIN: 3.2 g/dL — AB (ref 3.5–5.0)
ALK PHOS: 96 U/L (ref 40–150)
ALT: 13 U/L (ref 0–55)
ANION GAP: 8 meq/L (ref 3–11)
AST: 14 U/L (ref 5–34)
BUN: 22.4 mg/dL (ref 7.0–26.0)
CO2: 26 mEq/L (ref 22–29)
Calcium: 8.8 mg/dL (ref 8.4–10.4)
Chloride: 101 mEq/L (ref 98–109)
Creatinine: 1.2 mg/dL (ref 0.7–1.3)
EGFR: 67 mL/min/{1.73_m2} — AB (ref >90)
GLUCOSE: 291 mg/dL — AB (ref 70–140)
POTASSIUM: 4.2 meq/L (ref 3.5–5.1)
SODIUM: 136 meq/L (ref 136–145)
TOTAL PROTEIN: 6.2 g/dL — AB (ref 6.4–8.3)

## 2016-08-05 MED ORDER — DENOSUMAB 120 MG/1.7ML ~~LOC~~ SOLN
120.0000 mg | Freq: Once | SUBCUTANEOUS | Status: DC
  Filled 2016-08-05: qty 1.7

## 2016-08-05 NOTE — Progress Notes (Signed)
[facility]  Telephone:([phone]) [phone] Fax:([phone]) [phone]  Clinic Follow Up Note   Patient Care Team: [name]. [name], MD as PCP - General (Internal Medicine) [name], MD as Consulting Physician (Hematology) 08/05/2016   CHIEF COMPLAINTS:  Follow up metastatic lung cancer  Oncology History   Metastatic lung cancer (metastasis from lung to other site)   Staging form: Lung, AJCC 7th Edition     Clinical: Stage IV (T3, N3, M1b) - Unsigned        Metastatic lung cancer (metastasis from lung to other site) ([place])   03/24/2015 Imaging    brian MRI 1.9 cm high left frontal lobe mass with minimal surrounding edema     03/24/2015 Imaging    CT showed Multiple low-density lesions in the liver worrisome for metastatic disease. Consolidative infiltrate in the left lower lobe with interstitial infiltrate in the left upper lobe. Single enlarged left hilar lymph node.     03/27/2015 Initial Diagnosis    Metastatic lung cancer (metastasis from lung to other site)     03/27/2015 Pathology Results    Metastatic adenocarcinoma, IHC positive for CK 7, TTF-1 and Napsin A. Negative for CK 20, CD X2, PSA, consistent with lung primary     03/27/2015 Miscellaneous    Foundation One test showed (+) EML4-ALK fusion (variant 2), CDKN2A/B loss, SMAD4 R361H.      04/07/2015 Imaging    PET scan showed hypermetabolic soft tissue infiltrates in the left lower lobe lung, bilateral mediastinal adenopathy,widespread metastatic disease, including to thoracic, low cervical nodes, liver and bones.     04/10/2015 - 04/10/2015 Radiation Therapy    SRS to the brain met      04/27/2015 - 09/08/2015 Chemotherapy    Crizotinib 214m bid, stopped due to disease preogression      08/08/2015 - 08/12/2015 Hospital Ad[facility]     [facility] showed small hemorrhage. Lovenox was held,  and he had a IVC filter Placed. Lovenox was resumed one month later.        09/09/2015 -  Chemotherapy    Alectinib 6066mbid      03/04/2016 Imaging    Decreased in size of the region of abnormal enhancement in the left posterior frontal cortical region since the study of January, marked reduction in regional vasogenic edema. 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 no new lesions     03/21/2016 Imaging    PET scan showed complete metabolic response, mild residual nodular scarring in the left lower lobe, residual hepatic lesion measuring up to 1.5 cm, multifocal sclerotic osseous lesions, without hypermetabolic of the above lesions.       HISTORY OF PRESENTING ILLNESS (03/29/2015):  Walter Wilkins is here because of newly diagnosed metastatic lung cancer.  He has been in good health until 3 month ago, when he started having intermittent nausea, and gradual weight loss a total of 18 lbs since then, although some are intentional by cutting back diet. He denies any vomiting, abdominal pain or discomfort, or change of his bowel habit.  He presented with syncope, right after he noticed some titch of left face, and rolling over of his eyes. He will [name] the ground and lost consciousness. This was witnessed by his wife and other people, 911 was called and paramedic staff underwent CPR for a total of 30 minutes. The cardiac monitor shows that he was in asystole. He  was brought to [facility] and was admitted. He workup in the ambulance, and did not have any significant in neurology deficit afterwards. He was seen by cardiology in the neurology service, cardiac catheterization was negative for coronary artery disease. He underwent a CT of head which showed a 1.9 cm lesion in the left frontal lobe with surrounding edema. CT of the chest reviewed no PE, but significant infiltrative consolidation in the left lower lobe, it was felt to be aspiration pneumonia. CT of the abdomen reviewed multiple diffuse liver metastasis. He underwent a liver biopsy  which showed adenocarcinoma.   He was a discharge to home. He has been doing well overall since hospital discharge. He has some residual pain from the rib fracture from CPR. He denies any cough, dyspnea, or other symptoms. He has mild fatigue and low appetite, which has been chronic since his gastric bypass surgery in 2011.   CURRENT THERAPY:  1.Alectinib 600 mg twice daily started on 09/11/2015 2. Xgeva every 4 weeks, changed to ever 3 month in 05/2016  INTERIM HISTORY: [name] returns for follow-up. He is accompanied by his wife to the clinic today.  He is doing well overall. He is compliant and tolerating Actonel well, without significant side effects. His wife reports slightly worse depression and anxiety, and her primary care physician has increased his Cymbalta lately. He denies any significant chest pain, dyspnea, or cough. His appetite and energy level remains to be well overall. He has gained 6 pounds in the past 2 months.  MEDICAL HISTORY:  Past Medical History:  Diagnosis Date  . Acid reflux   . Brain mass 03/24/15   ct head - left frontal lobe mass consistent with a brain metastasis  . Cancer ([place])    mass on lung, brain, sinuses, liver and bones  . Diabetes mellitus without complication ([place])   . H/O gastric bypass    a. ~2011 at [facility].  . Seizures ([place]) 03/23/15    SURGICAL HISTORY: Past Surgical History:  Procedure Laterality Date  . APPENDECTOMY    . CHOLECYSTECTOMY    . GASTRIC BYPASS  2011  . LEFT HEART CATHETERIZATION WITH CORONARY ANGIOGRAM N/A 03/23/2015   Procedure: LEFT HEART CATHETERIZATION WITH CORONARY ANGIOGRAM;  Surgeon: Burnell Blanks, MD;  Location: [facility] CATH LAB;  Service: Cardiovascular;  Laterality: N/A;  . LIVER BIOPSY  03/27/15  . right knee osroscopy Right     SOCIAL HISTORY: History   Social History  . Marital Status: Unknown    Spouse Name: N/A  . Number of Children: 2  . Years of Education: N/A   Occupational History  .  [employer]    Social History Main Topics  . Smoking status: Uses cigar   . Smokeless tobacco: Not on file  . Alcohol Use: 0.0 oz/week    0 Standard drinks or equivalent per week     Comment: Occasionally socially - once a week or less  . Drug Use: No  . Sexual Activity: Not on file   Other Topics Concern  . Not on file   Social History Narrative    FAMILY HISTORY: Family History  Problem Relation Age of Onset  . Valvular heart disease Father     H/o pig valve  . Cancer Father     skin cancer   . Sudden death Neg Hx     No family history of sudden cardiac death  . Breast cancer Mother   . Cancer Mother 30  breast cancer   . Cancer Brother 42    base of tongue   . Cancer Maternal Uncle     throat cancer   . Cancer Maternal Uncle 90    colon cancer, and prostate cancer   . Cancer Cousin     lung cancer     ALLERGIES:  has No Known Allergies.  MEDICATIONS:  Current Outpatient Prescriptions  Medication Sig Dispense Refill  . alectinib (ALECENSA) 150 MG capsule Take 4 capsules (600 mg total) by mouth 2 (two) times daily. 240 capsule 3  . ALPRAZolam (XANAX) 0.5 MG tablet TAKE ONE TABLET BY MOUTH THREE TIMES A DAY AS NEEDED. OK to refill on 07/19/16 90 tablet 0  . atorvastatin (LIPITOR) 40 MG tablet Take 40 mg by mouth at bedtime. Last Refill 08/22/2014    . blood glucose meter kit and supplies KIT Dispense based on patient and insurance preference. Use up to four times daily as directed. (FOR ICD-9 250.00, 250.01). 1 each 0  . Calcium Carbonate-Vitamin D (CALCIUM-VITAMIN D) 500-200 MG-UNIT tablet Take by mouth.    . carbamazepine (TEGRETOL) 200 MG tablet 1/2 tablet twice a day for 2 weeks, then take 1 tablet twice a day 60 tablet 3  . DULoxetine (CYMBALTA) 60 MG capsule Take 60 mg by mouth.    . enoxaparin (LOVENOX) 100 MG/ML injection Inject 1 mL (100 mg total) into the skin daily. 30 Syringe 3  . glipiZIDE (GLUCOTROL) 5 MG tablet Take 5 mg by mouth daily before  breakfast.    . levETIRAcetam (KEPPRA) 750 MG tablet Take 2 tablets (1,500 mg total) by mouth 2 (two) times daily. 180 tablet 2  . metFORMIN (GLUCOPHAGE) 1000 MG tablet     . mirtazapine (REMERON) 45 MG tablet TAKE 1 TABLET (45 MG TOTAL) BY MOUTH AT BEDTIME. 30 tablet 2  . Multiple Vitamin (MULTI-VITAMINS) TABS Take by mouth.    Marland Kitchen omeprazole (PRILOSEC) 20 MG capsule Take 20 mg by mouth daily. Last Refill 08/22/2014    . ondansetron (ZOFRAN) 8 MG tablet Take 1 tablet (8 mg total) by mouth every 8 (eight) hours as needed for nausea or vomiting. 30 tablet 3  . pentoxifylline (TRENTAL) 400 MG CR tablet Take 439m tab daily x 1 week, then 409mBID; take with food 60 tablet 5  . traZODone (DESYREL) 50 MG tablet Take 50 mg by mouth at bedtime.    . vitamin E 400 UNIT capsule Take 400 IU daily x 1 week, then 400 IU BID 60 capsule 5   No current facility-administered medications for this visit.     REVIEW OF SYSTEMS:   Constitutional: Denies fevers, chills or abnormal night sweats, (+) fatigue stable  Eyes: Denies blurriness of vision, double vision or watery eyes Ears, nose, mouth, throat, and face: Denies mucositis or sore throat Respiratory: Denies cough, dyspnea or wheezes Cardiovascular: Denies palpitation, chest discomfort or lower extremity swelling Gastrointestinal:  Mild intermittent nausea, no heartburn or change in bowel habits Skin: Denies abnormal skin rashes Lymphatics: Denies new lymphadenopathy or easy bruising Neurological:Denies numbness, tingling or new weaknesses Behavioral/Psych: Mood is stable, no new changes  All other systems were reviewed with the patient and are negative.  PHYSICAL EXAMINATION: ECOG PERFORMANCE STATUS: 1  Vitals:   08/05/16 1407  BP: (!) 141/52  Pulse: 69  Resp: 18  Temp: 98.7 F (37.1 C)   Filed Weights   08/05/16 1407  Weight: (!) 320 lb 12.8 oz (145.5 kg)    GENERAL:alert, no distress  and comfortable SKIN: skin color, texture, turgor  are normal, no rashes or significant lesions EYES: normal, conjunctiva are pink and non-injected, sclera clear OROPHARYNX:no exudate, no erythema and lips, buccal mucosa, and tongue normal  NECK: supple, thyroid normal size, non-tender, without nodularity LYMPH:  no palpable lymphadenopathy in the cervical, axillary or inguinal LUNGS: clear to auscultation and percussion with normal breathing effort HEART: regular rate & rhythm and no murmurs and no lower extremity edema ABDOMEN:abdomen soft, non-tender and normal bowel sounds Musculoskeletal:no cyanosis of digits and no clubbing  PSYCH: alert & oriented x 3 with fluent speech NEURO: no focal motor/sensory deficits  LABORATORY DATA:  I have reviewed the data as listed CBC Latest Ref Rng & Units 08/05/2016 08/05/2016 06/04/2016  WBC 3.4 - 10.8 x10E3/uL 8.5 8.4 9.2  Hemoglobin 13.0 - 17.1 g/dL 10.9(L) - 11.1(L)  Hematocrit 37.5 - 51.0 % 34.5(L) 33.6(L) 33.9(L)  Platelets 150 - 379 x10E3/uL 228 208 171    CMP Latest Ref Rng & Units 08/05/2016 08/05/2016 06/04/2016  Glucose 65 - 99 mg/dL 242(H) 291(H) 296(H)  BUN 8 - 27 mg/dL 21 22.4 25.6  Creatinine 0.76 - 1.27 mg/dL 0.92 1.2 1.3  Sodium 134 - 144 mmol/L 137 136 137  Potassium 3.5 - 5.2 mmol/L 4.7 4.2 4.3  Chloride 96 - 106 mmol/L 97 - -  CO2 18 - 29 mmol/L 24 26 30(H)  Calcium 8.6 - 10.2 mg/dL 8.8 8.8 8.6  Total Protein 6.0 - 8.5 g/dL 5.9(L) 6.2(L) 5.9(L)  Total Bilirubin 0.0 - 1.2 mg/dL 0.2 <0.30 0.38  Alkaline Phos 39 - 117 IU/L 99 96 77  AST 0 - 40 IU/L 16 14 14   ALT 0 - 44 IU/L 13 13 16     PATHOLOGY REPORT: Liver, needle/core biopsy, right lobe 03/27/2015  - METASTATIC ADENOCARCINOMA, SEE COMMENT. Microscopic Comment The adenocarcinoma demonstrates the following immunophenotype Cytokeratin 7 - strong diffuse expression. Cytokeratin 20 - negative expression. TTF-1 - patchy moderate strong expression. Napsin A - strong diffuse expression. CDX-2 - negative expression. PSA -  negative expression. Overall, the morphology and immunophenotype are that of metastatic adenocarcinoma, primary to lung. There is tumor available for ancillary tumor testing. The case is reviewed with Dr. Lyndon Code who concurs. The case was discussed with Dr Burr Medico on 03/29/2015. (CRR:gt:ecj, 03/29/15)  RADIOGRAPHIC STUDIES: I have personally reviewed the radiological images as listed and agreed with the findings in the report.  PET  06/03/2016 IMPRESSION: 1. No evidence of hypermetabolic residual or recurrent disease. 2. Similar non hypermetabolic widespread sclerotic osseous metastasis.  Brain MRI with and without contrast 03/04/2016 IMPRESSION: Decrease in size of the region of abnormal enhancement in the left posterior frontal cortical region since the study of January. Marked reduction in regional vasogenic edema. 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 No finding to suggest residual or recurrent tumor at this time. No new lesions.   ASSESSMENT & PLAN:  60 year old Caucasian Wilkins with minimal smoking history, obesity status post gastric bypass surgery, diabetes, presented with cardiac arrest and presumed seizure. Imaging study showed left lower lobe infiltrative consultation, left hilar adenopathy, diffuse liver metastasis and left front lobe brain metastasis.  1. 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 Images were reviewed in person. -We discussed that his cancer is incurable at this stage, but treatable. We now have a 3 ALK  inhibitors available for this type of lung cancer, with very good response rate and significantly increase patient's survival. -I discussed his recent restaging PET scan findings from 06/03/2016. It showed complete metabolic response to  Alectinib, small liver and sclerotic bone lesions are not hypermetabolic. He is tolerating Alectinib very well, and he is very excited about the excellent response -he is clinically doing well, lab results reviewed with pt and his wife, will continue Alectinib,  He is tolerating very well, no significant side effects. -We'll repeat PET scan in 6 weeks   2. Brain mets -s/p SRS, follow-up with radiation oncology -His restaging brain MRI from 03/04/2016 showed decreasing size of his abnormal enhancement in the left posterior frontal cortical region, marked decrease in regional vasogenic edema, consistent with radiation necrosis. -He is of dexamethasone now, doing well. -Alectinib has some CNS penetration, hopefully will control his brain mets also  -His restaging brain MRI on 06/04/2016 showed mild progression of previous treated metastasis, no new lesions. He is off steroids.   3. Bone mets -on Xgeva,  He has received 1 year monthly treatment,  We'll switch to every 3 months from 05/2016 -He is taking calcium 2-3 tab daily and vitamin D.  4. history of cardiac arrest -Likely related to his breathing metastasis and seizure -Cardiac cath was negative for coronary artery disease  5. Seizure, secondary to brain metastasis -Continue Keppra, his does was increased in 10/2015  -He will follow up with neurologist in [place]. He has not driven since the seizure.  He had 2 small episodes of seizure in the past few weeks.  6.  diabetes  -Continue follow-up with primary care physician.   7. DVT and Pulmonary embolism, diagnosed on 06/15/2015 -Probably related to his underline malignancy -We previously discussed the option of anticoagulation, which include Lovenox and Coumadin. Low molecular weight heparin (such as Lovenox) works better than coumadin to prevent recurrent thrombosis in cancer patients [facility] [phone]), and I recommend him continue lovenox indefinitely unless there is a  contraindication such as recurrent bleeding. -he is on low dose of lovenox 169m daily now, due to the concern of tumor hemorrhage in brain. This was the concencus from CNS tumor board discussion.   9. Depression -Improved [facility] -continue mirtazapine 46mat bedtime, he is also on Cymbalta.  10. Anemia -mild and stable, developed since he started Alectinib, likely related  -will follow up closely  11. Hypocalcemia -secondary to Xgeva  -continue calcium 3 tab daily  -Hold Xgeva if correct calcium<8  12. Morbid obesity  -He has gained a lot of weight from steroids. He is off steroids now. He agrees to watch his diet closely.  Follow-up -continue Alectinib  -RTC in 6 weeks follow up and lab, restaging PET and Xgeva injection    All questions were answered. The patient knows to call the clinic with any problems, questions or concerns.  I spent 20 minutes counseling the patient face to face. The total time spent in the appointment was 25 minutes and more than 50% was on counseling.     FeTruitt MerleMD 08/05/2016

## 2016-08-05 NOTE — Progress Notes (Signed)
MD cancelled pt's xgeva Injection today

## 2016-08-05 NOTE — Patient Instructions (Signed)
Denosumab injection  What is this medicine?  DENOSUMAB (den oh sue mab) slows bone breakdown. Prolia is used to treat osteoporosis in women after menopause and in men. Xgeva is used to prevent bone fractures and other bone problems caused by cancer bone metastases. Xgeva is also used to treat giant cell tumor of the bone.  This medicine may be used for other purposes; ask your health care provider or pharmacist if you have questions.  What should I tell my health care provider before I take this medicine?  They need to know if you have any of these conditions:  -dental disease  -eczema  -infection or history of infections  -kidney disease or on dialysis  -low blood calcium or vitamin D  -malabsorption syndrome  -scheduled to have surgery or tooth extraction  -taking medicine that contains denosumab  -thyroid or parathyroid disease  -an unusual reaction to denosumab, other medicines, foods, dyes, or preservatives  -pregnant or trying to get pregnant  -breast-feeding  How should I use this medicine?  This medicine is for injection under the skin. It is given by a health care professional in a hospital or clinic setting.  If you are getting Prolia, a special MedGuide will be given to you by the pharmacist with each prescription and refill. Be sure to read this information carefully each time.  For Prolia, talk to your pediatrician regarding the use of this medicine in children. Special care may be needed. For Xgeva, talk to your pediatrician regarding the use of this medicine in children. While this drug may be prescribed for children as young as 13 years for selected conditions, precautions do apply.  Overdosage: If you think you have taken too much of this medicine contact a poison control center or emergency room at once.  NOTE: This medicine is only for you. Do not share this medicine with others.  What if I miss a dose?  It is important not to miss your dose. Call your doctor or health care professional if you are  unable to keep an appointment.  What may interact with this medicine?  Do not take this medicine with any of the following medications:  -other medicines containing denosumab  This medicine may also interact with the following medications:  -medicines that suppress the immune system  -medicines that treat cancer  -steroid medicines like prednisone or cortisone  This list may not describe all possible interactions. Give your health care provider a list of all the medicines, herbs, non-prescription drugs, or dietary supplements you use. Also tell them if you smoke, drink alcohol, or use illegal drugs. Some items may interact with your medicine.  What should I watch for while using this medicine?  Visit your doctor or health care professional for regular checks on your progress. Your doctor or health care professional may order blood tests and other tests to see how you are doing.  Call your doctor or health care professional if you get a cold or other infection while receiving this medicine. Do not treat yourself. This medicine may decrease your body's ability to fight infection.  You should make sure you get enough calcium and vitamin D while you are taking this medicine, unless your doctor tells you not to. Discuss the foods you eat and the vitamins you take with your health care professional.  See your dentist regularly. Brush and floss your teeth as directed. Before you have any dental work done, tell your dentist you are receiving this medicine.  Do   not become pregnant while taking this medicine or for 5 months after stopping it. Women should inform their doctor if they wish to become pregnant or think they might be pregnant. There is a potential for serious side effects to an unborn child. Talk to your health care professional or pharmacist for more information.  What side effects may I notice from receiving this medicine?  Side effects that you should report to your doctor or health care professional as soon as  possible:  -allergic reactions like skin rash, itching or hives, swelling of the face, lips, or tongue  -breathing problems  -chest pain  -fast, irregular heartbeat  -feeling faint or lightheaded, falls  -fever, chills, or any other sign of infection  -muscle spasms, tightening, or twitches  -numbness or tingling  -skin blisters or bumps, or is dry, peels, or red  -slow healing or unexplained pain in the mouth or jaw  -unusual bleeding or bruising  Side effects that usually do not require medical attention (Report these to your doctor or health care professional if they continue or are bothersome.):  -muscle pain  -stomach upset, gas  This list may not describe all possible side effects. Call your doctor for medical advice about side effects. You may report side effects to FDA at 1-800-FDA-1088.  Where should I keep my medicine?  This medicine is only given in a clinic, doctor's office, or other health care setting and will not be stored at home.  NOTE: This sheet is a summary. It may not cover all possible information. If you have questions about this medicine, talk to your doctor, pharmacist, or health care provider.      2016, Elsevier/Gold Standard. (2012-06-08 12:37:47)

## 2016-08-05 NOTE — Telephone Encounter (Signed)
GAVE PATIENT AVS REPORT AND APPOINTMENTS FOR September. CENTRAL RADIOLOGY SCHEDULE WILL CALL RE PET SCAN WHEN ORDER IS ENTERED AND AUTH OBTAINED.

## 2016-08-06 ENCOUNTER — Telehealth: Payer: Self-pay

## 2016-08-06 LAB — CBC WITH DIFFERENTIAL/PLATELET
BASOS: 1 %
Basophils Absolute: 0.1 10*3/uL (ref 0.0–0.2)
EOS (ABSOLUTE): 0.6 10*3/uL — ABNORMAL HIGH (ref 0.0–0.4)
Eos: 7 %
HEMOGLOBIN: 11.1 g/dL — AB (ref 12.6–17.7)
Hematocrit: 34.5 % — ABNORMAL LOW (ref 37.5–51.0)
IMMATURE GRANS (ABS): 0 10*3/uL (ref 0.0–0.1)
Immature Granulocytes: 1 %
Lymphocytes Absolute: 1.8 10*3/uL (ref 0.7–3.1)
Lymphs: 22 %
MCH: 29.1 pg (ref 26.6–33.0)
MCHC: 32.2 g/dL (ref 31.5–35.7)
MCV: 90 fL (ref 79–97)
MONOCYTES: 8 %
Monocytes Absolute: 0.7 10*3/uL (ref 0.1–0.9)
NEUTROS ABS: 5.3 10*3/uL (ref 1.4–7.0)
NEUTROS PCT: 61 %
Platelets: 228 10*3/uL (ref 150–379)
RBC: 3.82 x10E6/uL — ABNORMAL LOW (ref 4.14–5.80)
RDW: 15.1 % (ref 12.3–15.4)
WBC: 8.5 10*3/uL (ref 3.4–10.8)

## 2016-08-06 LAB — COMPREHENSIVE METABOLIC PANEL
ALBUMIN: 3.7 g/dL (ref 3.6–4.8)
ALT: 13 IU/L (ref 0–44)
AST: 16 IU/L (ref 0–40)
Albumin/Globulin Ratio: 1.7 (ref 1.2–2.2)
Alkaline Phosphatase: 99 IU/L (ref 39–117)
BUN/Creatinine Ratio: 23 (ref 10–24)
BUN: 21 mg/dL (ref 8–27)
Bilirubin Total: 0.2 mg/dL (ref 0.0–1.2)
CALCIUM: 8.8 mg/dL (ref 8.6–10.2)
CO2: 24 mmol/L (ref 18–29)
CREATININE: 0.92 mg/dL (ref 0.76–1.27)
Chloride: 97 mmol/L (ref 96–106)
GFR calc Af Amer: 103 mL/min/{1.73_m2} (ref >59)
GFR, EST NON AFRICAN AMERICAN: 89 mL/min/{1.73_m2} (ref >59)
GLOBULIN, TOTAL: 2.2 g/dL (ref 1.5–4.5)
Glucose: 242 mg/dL — ABNORMAL HIGH (ref 65–99)
Potassium: 4.7 mmol/L (ref 3.5–5.2)
SODIUM: 137 mmol/L (ref 134–144)
Total Protein: 5.9 g/dL — ABNORMAL LOW (ref 6.0–8.5)

## 2016-08-06 LAB — CARBAMAZEPINE LEVEL, TOTAL: Carbamazepine (Tegretol), S: 4.7 ug/mL (ref 4.0–12.0)

## 2016-08-06 NOTE — Telephone Encounter (Signed)
-----   Message from Kathrynn Ducking, MD sent at 08/06/2016  4:17 PM EDT -----  The blood work shows an elevated blood sugar of 242, minimally low protein level, and the CBC shows a mild anemia that is stable from 2 months ago. Carbamazepine level is therapeutic. No change in dose unless the patient has more seizures. Please call the patient. ----- Message ----- From: Lavone Neri Lab Results In Sent: 08/06/2016   7:42 AM To: Kathrynn Ducking, MD

## 2016-08-06 NOTE — Telephone Encounter (Signed)
Called pt w/ lab results. Instructed to continue seizure meds at current dose. May call back w/ additional questions/conerns.

## 2016-08-12 ENCOUNTER — Other Ambulatory Visit: Payer: Self-pay | Admitting: Neurology

## 2016-08-18 ENCOUNTER — Encounter: Payer: Self-pay | Admitting: Hematology

## 2016-08-28 ENCOUNTER — Encounter: Payer: Self-pay | Admitting: Neurology

## 2016-08-28 ENCOUNTER — Telehealth: Payer: Self-pay | Admitting: Neurology

## 2016-08-28 NOTE — Telephone Encounter (Signed)
Pt's wife called in asking for an official letter that the pt can no longer drive due to brain tumor for their insurance. May fax to : [phone], phone" [phone] - [employer]; Attn Barnabas Lister

## 2016-08-28 NOTE — Telephone Encounter (Signed)
I have written a letter indicating that the patient has been asked not to operate a motor vehicle.

## 2016-09-03 ENCOUNTER — Other Ambulatory Visit: Payer: Self-pay | Admitting: *Deleted

## 2016-09-03 DIAGNOSIS — C3492 Malignant neoplasm of unspecified part of left bronchus or lung: Secondary | ICD-10-CM

## 2016-09-03 MED ORDER — ALECTINIB HCL 150 MG PO CAPS: 600.0000 mg | ORAL_CAPSULE | Freq: Two times a day (BID) | ORAL | 5 refills | Status: AC

## 2016-09-04 ENCOUNTER — Ambulatory Visit: Payer: Commercial Managed Care - PPO | Admitting: Neurology

## 2016-09-16 ENCOUNTER — Ambulatory Visit (HOSPITAL_BASED_OUTPATIENT_CLINIC_OR_DEPARTMENT_OTHER): Payer: Commercial Managed Care - PPO

## 2016-09-16 ENCOUNTER — Other Ambulatory Visit (HOSPITAL_BASED_OUTPATIENT_CLINIC_OR_DEPARTMENT_OTHER): Payer: Commercial Managed Care - PPO

## 2016-09-16 ENCOUNTER — Encounter (HOSPITAL_COMMUNITY)
Admission: RE | Admit: 2016-09-16 | Discharge: 2016-09-16 | Disposition: A | Discharge status: Home / Self Care | Payer: Commercial Managed Care - PPO | Source: Ambulatory Visit | Attending: Hematology | Admitting: Hematology

## 2016-09-16 ENCOUNTER — Telehealth: Payer: Self-pay | Admitting: Hematology

## 2016-09-16 ENCOUNTER — Encounter: Payer: Self-pay | Admitting: Hematology

## 2016-09-16 ENCOUNTER — Ambulatory Visit (HOSPITAL_BASED_OUTPATIENT_CLINIC_OR_DEPARTMENT_OTHER): Payer: Commercial Managed Care - PPO | Admitting: Hematology

## 2016-09-16 ENCOUNTER — Other Ambulatory Visit: Payer: Self-pay | Admitting: Medical Oncology

## 2016-09-16 VITALS — BP 154/75 | HR 72 | Temp 98.2°F | Resp 18 | Ht 72.0 in | Wt 312.5 lb

## 2016-09-16 DIAGNOSIS — Z86711 Personal history of pulmonary embolism: Secondary | ICD-10-CM

## 2016-09-16 DIAGNOSIS — C787 Secondary malignant neoplasm of liver and intrahepatic bile duct: Secondary | ICD-10-CM

## 2016-09-16 DIAGNOSIS — C3492 Malignant neoplasm of unspecified part of left bronchus or lung: Secondary | ICD-10-CM

## 2016-09-16 DIAGNOSIS — C7931 Secondary malignant neoplasm of brain: Secondary | ICD-10-CM | POA: Diagnosis not present

## 2016-09-16 DIAGNOSIS — E1169 Type 2 diabetes mellitus with other specified complication: Secondary | ICD-10-CM

## 2016-09-16 DIAGNOSIS — I10 Essential (primary) hypertension: Secondary | ICD-10-CM

## 2016-09-16 DIAGNOSIS — R569 Unspecified convulsions: Secondary | ICD-10-CM

## 2016-09-16 DIAGNOSIS — D649 Anemia, unspecified: Secondary | ICD-10-CM

## 2016-09-16 DIAGNOSIS — Z86718 Personal history of other venous thrombosis and embolism: Secondary | ICD-10-CM

## 2016-09-16 DIAGNOSIS — C7951 Secondary malignant neoplasm of bone: Secondary | ICD-10-CM | POA: Diagnosis not present

## 2016-09-16 DIAGNOSIS — F329 Major depressive disorder, single episode, unspecified: Secondary | ICD-10-CM

## 2016-09-16 DIAGNOSIS — E669 Obesity, unspecified: Secondary | ICD-10-CM

## 2016-09-16 DIAGNOSIS — D630 Anemia in neoplastic disease: Secondary | ICD-10-CM

## 2016-09-16 LAB — CBC & DIFF AND RETIC
BASO%: 0.5 % (ref 0.0–2.0)
Basophils Absolute: 0.1 10*3/uL (ref 0.0–0.1)
EOS%: 21.2 % — AB (ref 0.0–7.0)
Eosinophils Absolute: 2 10*3/uL — ABNORMAL HIGH (ref 0.0–0.5)
HCT: 38.9 % (ref 38.4–49.9)
HGB: 12.8 g/dL — ABNORMAL LOW (ref 13.0–17.1)
Immature Retic Fract: 15.4 % — ABNORMAL HIGH (ref 3.00–10.60)
LYMPH#: 1.6 10*3/uL (ref 0.9–3.3)
LYMPH%: 16.9 % (ref 14.0–49.0)
MCH: 30.4 pg (ref 27.2–33.4)
MCHC: 32.9 g/dL (ref 32.0–36.0)
MCV: 92.4 fL (ref 79.3–98.0)
MONO#: 0.6 10*3/uL (ref 0.1–0.9)
MONO%: 6.3 % (ref 0.0–14.0)
NEUT#: 5.1 10*3/uL (ref 1.5–6.5)
NEUT%: 55.1 % (ref 39.0–75.0)
PLATELETS: 196 10*3/uL (ref 140–400)
RBC: 4.21 10*6/uL (ref 4.20–5.82)
RDW: 16.7 % — ABNORMAL HIGH (ref 11.0–14.6)
Retic %: 2.19 % — ABNORMAL HIGH (ref 0.80–1.80)
Retic Ct Abs: 92.2 10*3/uL (ref 34.80–93.90)
WBC: 9.2 10*3/uL (ref 4.0–10.3)

## 2016-09-16 LAB — COMPREHENSIVE METABOLIC PANEL
ALT: 12 U/L (ref 0–55)
ANION GAP: 9 meq/L (ref 3–11)
AST: 14 U/L (ref 5–34)
Albumin: 3.3 g/dL — ABNORMAL LOW (ref 3.5–5.0)
Alkaline Phosphatase: 127 U/L (ref 40–150)
BUN: 14 mg/dL (ref 7.0–26.0)
CHLORIDE: 99 meq/L (ref 98–109)
CO2: 29 meq/L (ref 22–29)
Calcium: 9 mg/dL (ref 8.4–10.4)
Creatinine: 1.1 mg/dL (ref 0.7–1.3)
EGFR: 72 mL/min/{1.73_m2} — AB (ref >90)
Glucose: 186 mg/dl — ABNORMAL HIGH (ref 70–140)
POTASSIUM: 4.5 meq/L (ref 3.5–5.1)
Sodium: 138 mEq/L (ref 136–145)
Total Bilirubin: 0.31 mg/dL (ref 0.20–1.20)
Total Protein: 6.4 g/dL (ref 6.4–8.3)

## 2016-09-16 LAB — GLUCOSE, CAPILLARY: GLUCOSE-CAPILLARY: 162 mg/dL — AB (ref 65–99)

## 2016-09-16 MED ORDER — DENOSUMAB 120 MG/1.7ML ~~LOC~~ SOLN
120.0000 mg | Freq: Once | SUBCUTANEOUS | Status: AC
  Administered 2016-09-16: 120 mg via SUBCUTANEOUS
  Filled 2016-09-16: qty 1.7

## 2016-09-16 MED ORDER — FLUDEOXYGLUCOSE F - 18 (FDG) INJECTION
15.6600 | Freq: Once | INTRAVENOUS | Status: AC | PRN
  Administered 2016-09-16: 15.66 via INTRAVENOUS

## 2016-09-16 NOTE — Patient Instructions (Signed)
Denosumab injection  What is this medicine?  DENOSUMAB (den oh sue mab) slows bone breakdown. Prolia is used to treat osteoporosis in women after menopause and in men. Xgeva is used to prevent bone fractures and other bone problems caused by cancer bone metastases. Xgeva is also used to treat giant cell tumor of the bone.  This medicine may be used for other purposes; ask your health care provider or pharmacist if you have questions.  What should I tell my health care provider before I take this medicine?  They need to know if you have any of these conditions:  -dental disease  -eczema  -infection or history of infections  -kidney disease or on dialysis  -low blood calcium or vitamin D  -malabsorption syndrome  -scheduled to have surgery or tooth extraction  -taking medicine that contains denosumab  -thyroid or parathyroid disease  -an unusual reaction to denosumab, other medicines, foods, dyes, or preservatives  -pregnant or trying to get pregnant  -breast-feeding  How should I use this medicine?  This medicine is for injection under the skin. It is given by a health care professional in a hospital or clinic setting.  If you are getting Prolia, a special MedGuide will be given to you by the pharmacist with each prescription and refill. Be sure to read this information carefully each time.  For Prolia, talk to your pediatrician regarding the use of this medicine in children. Special care may be needed. For Xgeva, talk to your pediatrician regarding the use of this medicine in children. While this drug may be prescribed for children as young as 13 years for selected conditions, precautions do apply.  Overdosage: If you think you have taken too much of this medicine contact a poison control center or emergency room at once.  NOTE: This medicine is only for you. Do not share this medicine with others.  What if I miss a dose?  It is important not to miss your dose. Call your doctor or health care professional if you are  unable to keep an appointment.  What may interact with this medicine?  Do not take this medicine with any of the following medications:  -other medicines containing denosumab  This medicine may also interact with the following medications:  -medicines that suppress the immune system  -medicines that treat cancer  -steroid medicines like prednisone or cortisone  This list may not describe all possible interactions. Give your health care provider a list of all the medicines, herbs, non-prescription drugs, or dietary supplements you use. Also tell them if you smoke, drink alcohol, or use illegal drugs. Some items may interact with your medicine.  What should I watch for while using this medicine?  Visit your doctor or health care professional for regular checks on your progress. Your doctor or health care professional may order blood tests and other tests to see how you are doing.  Call your doctor or health care professional if you get a cold or other infection while receiving this medicine. Do not treat yourself. This medicine may decrease your body's ability to fight infection.  You should make sure you get enough calcium and vitamin D while you are taking this medicine, unless your doctor tells you not to. Discuss the foods you eat and the vitamins you take with your health care professional.  See your dentist regularly. Brush and floss your teeth as directed. Before you have any dental work done, tell your dentist you are receiving this medicine.  Do   not become pregnant while taking this medicine or for 5 months after stopping it. Women should inform their doctor if they wish to become pregnant or think they might be pregnant. There is a potential for serious side effects to an unborn child. Talk to your health care professional or pharmacist for more information.  What side effects may I notice from receiving this medicine?  Side effects that you should report to your doctor or health care professional as soon as  possible:  -allergic reactions like skin rash, itching or hives, swelling of the face, lips, or tongue  -breathing problems  -chest pain  -fast, irregular heartbeat  -feeling faint or lightheaded, falls  -fever, chills, or any other sign of infection  -muscle spasms, tightening, or twitches  -numbness or tingling  -skin blisters or bumps, or is dry, peels, or red  -slow healing or unexplained pain in the mouth or jaw  -unusual bleeding or bruising  Side effects that usually do not require medical attention (Report these to your doctor or health care professional if they continue or are bothersome.):  -muscle pain  -stomach upset, gas  This list may not describe all possible side effects. Call your doctor for medical advice about side effects. You may report side effects to FDA at 1-800-FDA-1088.  Where should I keep my medicine?  This medicine is only given in a clinic, doctor's office, or other health care setting and will not be stored at home.  NOTE: This sheet is a summary. It may not cover all possible information. If you have questions about this medicine, talk to your doctor, pharmacist, or health care provider.      2016, Elsevier/Gold Standard. (2012-06-08 12:37:47)

## 2016-09-16 NOTE — Telephone Encounter (Signed)
Gave patient avs report and appointments for October. Message to YF - no ct order.

## 2016-09-16 NOTE — Progress Notes (Signed)
[address]  [phone] [phone]  Clinic Follow Up Note   Walter Wilkins Care Team: [name]. [name], MD as PCP - General (Internal Medicine) [name], MD as Consulting Physician (Hematology) 09/16/2016   CHIEF COMPLAINTS:  Follow up metastatic lung cancer  Oncology History   Metastatic lung cancer (metastasis from lung to other site)   Staging form: Lung, AJCC 7th Edition     Clinical: Stage IV (T3, N3, M1b) - Unsigned        Metastatic lung cancer (metastasis from lung to other site) ([place])   03/24/2015 Imaging    brian MRI 1.9 cm high left frontal lobe mass with minimal surrounding edema      03/24/2015 Imaging    CT showed Multiple low-density lesions in the liver worrisome for metastatic disease. Consolidative infiltrate in the left lower lobe with interstitial infiltrate in the left upper lobe. Single enlarged left hilar lymph node.      03/27/2015 Initial Diagnosis    Metastatic lung cancer (metastasis from lung to other site)      03/27/2015 Pathology Results    Metastatic adenocarcinoma, IHC positive for CK 7, TTF-1 Walter Napsin A. Negative for CK 20, CD X2, PSA, consistent with lung primary      03/27/2015 Miscellaneous    Foundation One test showed (+) EML4-ALK fusion (variant 2), CDKN2A/B loss, SMAD4 R361H.       04/07/2015 Imaging    PET scan showed hypermetabolic soft tissue infiltrates in the left lower lobe lung, bilateral mediastinal adenopathy,widespread metastatic disease, including to thoracic, low cervical nodes, liver Walter bones.      04/10/2015 - 04/10/2015 Radiation Therapy    SRS to the brain met       04/27/2015 - 09/08/2015 Chemotherapy    Crizotinib 262m bid, stopped due to disease preogression       08/08/2015 - 08/12/2015 Hospital Admission     [facility] of brain showed small hemorrhage. Lovenox was held,  Walter Walter Wilkins had a IVC filter Placed. Lovenox was resumed one  month later.       09/09/2015 -  Chemotherapy    Alectinib 6050mbid       03/04/2016 Imaging    Decreased in size of the region of abnormal enhancement in the left posterior frontal cortical region since the study of January, marked reduction in regional vasogenic edema. 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 no new lesions      03/21/2016 Imaging    PET scan showed complete metabolic response, mild residual nodular scarring in the left lower lobe, residual hepatic lesion measuring up to 1.5 cm, multifocal sclerotic osseous lesions, without hypermetabolic of the above lesions.        HISTORY OF PRESENTING ILLNESS (03/29/2015):  Walter [place][age].o. male is here because of newly diagnosed metastatic lung cancer.  Walter Wilkins has been in good health until 3 month ago, when Walter Wilkins started having intermittent nausea, Walter gradual weight loss a total of 18 lbs since then, although some are intentional by cutting back diet. Walter Wilkins denies any vomiting, abdominal pain or discomfort, or change of Walter Wilkins bowel habit.  Walter Wilkins presented with syncope, right after Walter Wilkins noticed some titch of left face, Walter rolling over of Walter Wilkins eyes. Walter Wilkins will Walter Halfn the ground Walter lost consciousness. This was witnessed by Walter Wilkins wife Walter other people, 911 was called Walter paramedic staff underwent CPR for a total of 30  minutes. The cardiac monitor shows that Walter Wilkins was in asystole. Walter Wilkins was brought to [facility] Walter was admitted. Walter Wilkins workup in the ambulance, Walter did not have any significant in neurology deficit afterwards. Walter Wilkins was seen by cardiology in the neurology service, cardiac catheterization was negative for coronary artery disease. Walter Wilkins underwent a CT of head which showed a 1.9 cm lesion in the left frontal lobe with surrounding edema. CT of the chest reviewed no PE, but significant infiltrative consolidation in the left lower lobe, it was felt to be aspiration pneumonia. CT of the abdomen reviewed multiple diffuse liver metastasis. Walter Wilkins  underwent a liver biopsy which showed adenocarcinoma.   Walter Wilkins was a discharge to home. Walter Wilkins has been doing well overall since hospital discharge. Walter Wilkins has some residual pain from the rib fracture from CPR. Walter Wilkins denies any cough, dyspnea, or other symptoms. Walter Wilkins has mild fatigue Walter low appetite, which has been chronic since Walter Wilkins gastric bypass surgery in 2011.   CURRENT THERAPY:  1.Alectinib 600 mg twice daily started on 09/11/2015 2. Xgeva every 4 weeks, changed to ever 3 month in 05/2016  INTERIM HISTORY: Walter Wilkins returns for follow-up. Walter Wilkins presents to clinic by himself today. Walter Wilkins had restaging PET scan this morning, Walter is here to discuss the result. Walter Wilkins has been feeling very well lately, has good appetite Walter energy level. Walter Wilkins is more active than before, swims 4-5 times a week. Walter Wilkins denies any fever, cough, dyspnea, or chest discomfort. Walter Wilkins weight has been stable. No other new complaints.  MEDICAL HISTORY:  Past Medical History:  Diagnosis Date  . Acid reflux   . Brain mass 03/24/15   ct head - left frontal lobe mass consistent with a brain metastasis  . Cancer ([place])    mass on lung, brain, sinuses, liver Walter bones  . Diabetes mellitus without complication ([place])   . H/O gastric bypass    a. ~2011 at [facility].  . Seizures ([place]) 03/23/15    SURGICAL HISTORY: Past Surgical History:  Procedure Laterality Date  . APPENDECTOMY    . CHOLECYSTECTOMY    . GASTRIC BYPASS  2011  . LEFT HEART CATHETERIZATION WITH CORONARY ANGIOGRAM N/A 03/23/2015   Procedure: LEFT HEART CATHETERIZATION WITH CORONARY ANGIOGRAM;  Surgeon: Burnell Blanks, MD;  Location: [facility] CATH LAB;  Service: Cardiovascular;  Laterality: N/A;  . LIVER BIOPSY  03/27/15  . right knee osroscopy Right     SOCIAL HISTORY: History   Social History  . Marital Status: Unknown    Spouse Name: N/A  . Number of Children: 2  . Years of Education: N/A   Occupational History  . Barrister's clerk    Social History Main Topics  . Smoking  status: Uses cigar   . Smokeless tobacco: Not on file  . Alcohol Use: 0.0 oz/week    0 Standard drinks or equivalent per week     Comment: Occasionally socially - once a week or less  . Drug Use: No  . Sexual Activity: Not on file   Other Topics Concern  . Not on file   Social History Narrative    FAMILY HISTORY: Family History  Problem Relation Age of Onset  . Valvular heart disease Father     H/o pig valve  . Cancer Father     skin cancer   . Breast cancer Mother   . Cancer Mother 43    breast cancer   . Cancer Brother 42    base of tongue   .  Cancer Maternal Uncle     throat cancer   . Cancer Maternal Uncle 90    colon cancer, Walter prostate cancer   . Cancer Cousin     lung cancer   . Sudden death Neg Hx     No family history of sudden cardiac death    ALLERGIES:  has No Known Allergies.  MEDICATIONS:  Current Outpatient Prescriptions  Medication Sig Dispense Refill  . alectinib (ALECENSA) 150 MG capsule Take 4 capsules (600 mg total) by mouth 2 (two) times daily. 240 capsule 5  . ALPRAZolam (XANAX) 0.5 MG tablet TAKE ONE TABLET BY MOUTH THREE TIMES A DAY AS NEEDED. OK to refill on 07/19/16 90 tablet 0  . atorvastatin (LIPITOR) 40 MG tablet Take 40 mg by mouth at bedtime. Last Refill 08/22/2014    . blood glucose meter kit Walter supplies KIT Dispense based on Walter Wilkins Walter insurance preference. Use up to four times daily as directed. (FOR ICD-9 250.00, 250.01). 1 each 0  . carbamazepine (TEGRETOL) 200 MG tablet 1/2 tablet twice a day for 2 weeks, then take 1 tablet twice a day 60 tablet 3  . Cyanocobalamin (RA VITAMIN B-12 TR) 1000 MCG TBCR Take 1 tablet by mouth daily.    . DULoxetine (CYMBALTA) 30 MG capsule Take 30 mg by mouth daily. Total of 90 mg daily-increased 08/05/16    . enoxaparin (LOVENOX) 100 MG/ML injection Inject 1 mL (100 mg total) into the skin daily. 30 Syringe 3  . glipiZIDE (GLUCOTROL) 5 MG tablet Take 5 mg by mouth daily before breakfast.    .  levETIRAcetam (KEPPRA) 750 MG tablet TAKE 2 TABLETS (1,500 MG TOTAL) BY MOUTH 2 (TWO) TIMES DAILY. 120 tablet 5  . metFORMIN (GLUCOPHAGE) 1000 MG tablet     . mirtazapine (REMERON) 45 MG tablet TAKE 1 TABLET (45 MG TOTAL) BY MOUTH AT BEDTIME. 30 tablet 2  . Multiple Vitamin (MULTI-VITAMINS) TABS Take 1 tablet by mouth daily.    Marland Kitchen omeprazole (PRILOSEC) 20 MG capsule Take 20 mg by mouth daily. Last Refill 08/22/2014    . pentoxifylline (TRENTAL) 400 MG CR tablet TAKE ONE TABLET BY MOUTH DAILY FOR 1 WEEK. THEN TAKE ONE TABLET TWO TIMES A DAY. TAKE WITH FOOD. 60 tablet 5  . traZODone (DESYREL) 50 MG tablet Take 50 mg by mouth at bedtime.    . vitamin E 400 UNIT capsule TAKE ONE CAPSULE (400 UNITS) DAILY FOR 1 WEEK. THEN TAKE ONE CAPSULE TWO TIMES A DAY 60 capsule 5  . Calcium Carbonate-Vitamin D (CALCIUM-VITAMIN D) 500-200 MG-UNIT tablet Take by mouth.    . DULoxetine (CYMBALTA) 60 MG capsule Take 60 mg by mouth.    . ondansetron (ZOFRAN) 8 MG tablet Take 1 tablet (8 mg total) by mouth every 8 (eight) hours as needed for nausea or vomiting. (Walter Wilkins not taking: Reported on 09/16/2016) 30 tablet 3   No current facility-administered medications for this visit.     REVIEW OF SYSTEMS:   Constitutional: Denies fevers, chills or abnormal night sweats, (+) fatigue stable  Eyes: Denies blurriness of vision, double vision or watery eyes Ears, nose, mouth, throat, Walter face: Denies mucositis or sore throat Respiratory: Denies cough, dyspnea or wheezes Cardiovascular: Denies palpitation, chest discomfort or lower extremity swelling Gastrointestinal:  Mild intermittent nausea, no heartburn or change in bowel habits Skin: Denies abnormal skin rashes Lymphatics: Denies new lymphadenopathy or easy bruising Neurological:Denies numbness, tingling or new weaknesses Behavioral/Psych: Mood is stable, no new changes  All other  systems were reviewed with the Walter Wilkins Walter are negative.  PHYSICAL EXAMINATION: ECOG  PERFORMANCE STATUS: 1  Vitals:   09/16/16 1201  BP: (!) 154/75  Pulse: 72  Resp: 18  Temp: 98.2 F (36.8 C)   Filed Weights   09/16/16 1201  Weight: (!) 312 lb 8 oz (141.7 kg)    GENERAL:alert, no distress Walter comfortable SKIN: skin color, texture, turgor are normal, no rashes or significant lesions EYES: normal, conjunctiva are pink Walter non-injected, sclera clear OROPHARYNX:no exudate, no erythema Walter lips, buccal mucosa, Walter tongue normal  NECK: supple, thyroid normal size, non-tender, without nodularity LYMPH:  no palpable lymphadenopathy in the cervical, axillary or inguinal LUNGS: clear to auscultation Walter percussion with normal breathing effort HEART: regular rate & rhythm Walter no murmurs Walter no lower extremity edema ABDOMEN:abdomen soft, non-tender Walter normal bowel sounds Musculoskeletal:no cyanosis of digits Walter no clubbing  PSYCH: alert & oriented x 3 with fluent speech NEURO: no focal motor/sensory deficits  LABORATORY DATA:  I have reviewed the data as listed CBC Latest Ref Rng & Units 09/16/2016 08/05/2016 08/05/2016  WBC 4.0 - 10.3 10e3/uL 9.2 8.5 8.4  Hemoglobin 13.0 - 17.1 g/dL 12.8(L) 10.9(L) -  Hematocrit 38.4 - 49.9 % 38.9 34.5(L) 33.6(L)  Platelets 140 - 400 10e3/uL 196 228 208    CMP Latest Ref Rng & Units 09/16/2016 08/05/2016 08/05/2016  Glucose 70 - 140 mg/dl 186(H) 242(H) 291(H)  BUN 7.0 - 26.0 mg/dL 14.0 21 22.4  Creatinine 0.7 - 1.3 mg/dL 1.1 0.92 1.2  Sodium 136 - 145 mEq/L 138 137 136  Potassium 3.5 - 5.1 mEq/L 4.5 4.7 4.2  Chloride 96 - 106 mmol/L - 97 -  CO2 22 - 29 mEq/L 29 24 26   Calcium 8.4 - 10.4 mg/dL 9.0 8.8 8.8  Total Protein 6.4 - 8.3 g/dL 6.4 5.9(L) 6.2(L)  Total Bilirubin 0.20 - 1.20 mg/dL 0.31 0.2 <0.30  Alkaline Phos 40 - 150 U/L 127 99 96  AST 5 - 34 U/L 14 16 14   ALT 0 - 55 U/L 12 13 13     PATHOLOGY REPORT: Liver, needle/core biopsy, right lobe 03/27/2015  - METASTATIC ADENOCARCINOMA, SEE COMMENT. Microscopic Comment The  adenocarcinoma demonstrates the following immunophenotype Cytokeratin 7 - strong diffuse expression. Cytokeratin 20 - negative expression. TTF-1 - patchy moderate strong expression. Napsin A - strong diffuse expression. CDX-2 - negative expression. PSA - negative expression. Overall, the morphology Walter immunophenotype are that of metastatic adenocarcinoma, primary to lung. There is tumor available for ancillary tumor testing. The case is reviewed with Dr. Lyndon Code who concurs. The case was discussed with Dr Burr Medico on 03/29/2015. (CRR:gt:ecj, 03/29/15)  RADIOGRAPHIC STUDIES: I have personally reviewed the radiological images as listed Walter agreed with the findings in the report.  PET  06/03/2016 IMPRESSION: 1. No evidence of hypermetabolic residual or recurrent disease. 2. Similar non hypermetabolic widespread sclerotic osseous metastasis.  Brain MRI with Walter without contrast 03/04/2016 IMPRESSION: Decrease in size of the region of abnormal enhancement in the left posterior frontal cortical region since the study of January. Marked reduction in regional vasogenic edema. 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 No finding to suggest residual or recurrent tumor at this time. No new lesions.   ASSESSMENT & PLAN:  60 year old Caucasian male with minimal smoking history, obesity status post gastric bypass surgery, diabetes, presented with cardiac arrest Walter presumed seizure. Imaging study showed left lower lobe infiltrative consultation, left hilar adenopathy, diffuse liver metastasis  Walter left front lobe brain metastasis.  1. 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 Images were reviewed in person. -We discussed that Walter Wilkins cancer is incurable at this stage, but treatable. We  now have a 3 ALK inhibitors available for this type of lung cancer, with very good response rate Walter significantly increase Walter Wilkins's survival. -I discussed Walter Wilkins recent restaging PET scan findings from 06/03/2016. It showed complete metabolic response to Alectinib, small liver Walter sclerotic bone lesions are not hypermetabolic. Walter Wilkins is tolerating Alectinib very well, Walter Walter Wilkins is very excited about the excellent response -I reviewed today's restaging scan images with Walter Wilkins in person, Walter Wilkins has developed new infiltrative lesions in the left lung, which is similar to Walter Wilkins initial mentation. Also radiologist feels this is likely inflammatory, Walter Wilkins has no clinical signs of pneumonia, I'm concerned this is likely disease progression, also atypical pneumonia is also possibility. -I recommend him to have a repeated CT chest in 4 weeks. If the left lung infiltrative lesions does not resolve on next scan, or if Walter Wilkins clinically develops cough, dyspnea, without clinical evidence of pulmonary infection, I would switch Walter Wilkins therapy. -a new ALK inhibitor Brigatinib has been approved recently by FDA for presumptive resistant ALK positive non-small cell lung cancer. Although it's uncertain if it overcome Alectinib resistance, I think it's worth of trying. I will get Walter Wilkins insurance approval first   -Walter Wilkins is clinically doing well, lab results reviewed with pt, will continue Alectinib for now,  Walter Wilkins is tolerating very well, no significant side effects.  2. Brain mets -s/p SRS, follow-up with radiation oncology -Walter Wilkins restaging brain MRI from 03/04/2016 showed decreasing size of Walter Wilkins abnormal enhancement in the left posterior frontal cortical region, marked decrease in regional vasogenic edema, consistent with radiation necrosis. -Walter Wilkins is off dexamethasone now, doing well. -Alectinib has some CNS penetration, hopefully will control Walter Wilkins brain mets also  -Walter Wilkins restaging brain MRI on 06/04/2016 showed mild progression of previous treated metastasis, no  new lesions. Walter Wilkins is off steroids.   3. Bone mets -on Xgeva,  Walter Wilkins has received 1 year monthly treatment,  We changed it to every 3 months from 05/2016 -Walter Wilkins is taking calcium 2-3 tab daily Walter vitamin D.  4. history of cardiac arrest -Likely related to Walter Wilkins breathing metastasis Walter seizure -Cardiac cath was negative for coronary artery disease  5. Seizure, secondary to brain metastasis -Continue Keppra, Walter Wilkins does was increased in 10/2015  -Walter Wilkins will follow up with neurologist in [place]. Walter Wilkins has not driven since the seizure.  Walter Wilkins had 2 small episodes of seizure in the past few weeks.  6.  diabetes  -Continue follow-up with primary care physician.   7. DVT Walter Pulmonary embolism, diagnosed on 06/15/2015 -Probably related to Walter Wilkins underline malignancy -We previously discussed the option of anticoagulation, which include Lovenox Walter Coumadin. Low molecular weight heparin (such as Lovenox) works better than coumadin to prevent recurrent thrombosis in cancer patients Alta Corning Med. [phone]), Walter I recommend him continue lovenox indefinitely unless there is a contraindication such as recurrent bleeding. -Walter Wilkins is on low dose of lovenox 158m daily now, due to the concern of tumor hemorrhage in brain. This was the concencus from CNS tumor board discussion.   9. Depression -[facility] -continue mirtazapine 433mat bedtime, Walter Wilkins is also on Cymbalta.  10. Anemia -mild Walter stable,  developed since Walter Wilkins started Alectinib, likely related  -will follow up closely, improved lately, Walter Wilkins is on iron Walter B12 supplements  11. Hypocalcemia -secondary to Xgeva  -continue calcium 3 tab daily  -Hold Xgeva if correct calcium<8  12. Morbid obesity  -Walter Wilkins has gained a lot of weight from steroids. Walter Wilkins is off steroids now. Walter Wilkins agrees to watch Walter Wilkins diet closely.  Follow-up -continue Alectinib for now -f/u with a repeated CT chest with contrast in 4 weeks -I'll coordinate with Dr. Camelia Eng about  Walter Wilkins next brain MRI scan  -Walter Wilkins knows to call me if Walter Wilkins develops new symptoms, especially pulmonary symptoms -I'll get Walter Wilkins insurance approval for Brigatinib, will likely switch on next visit  -I called Walter Wilkins wife Larene Beach after Walter Wilkins clinic visit, Walter I reviewed Walter Wilkins scan findings Walter plain with her in details.   All questions were answered. The Walter Wilkins knows to call the clinic with any problems, questions or concerns.  I spent 30 minutes counseling the Walter Wilkins face to face. The total time spent in the appointment was 40 minutes Walter more than 50% was on counseling.     [name], MD 09/16/2016

## 2016-09-18 ENCOUNTER — Other Ambulatory Visit: Payer: Self-pay | Admitting: Hematology

## 2016-09-18 MED ORDER — BRIGATINIB 30 MG PO TABS: 180.0000 mg | ORAL_TABLET | Freq: Every day | ORAL | 1 refills | Status: AC

## 2016-09-18 NOTE — Progress Notes (Signed)
Brigatinib RX follow-up   Mr Muhanad Torosyan for Brigatinib required a prior authorization from [employer].  Submitted PA and received denial stating his plan does not cover medication.  Appeal sent to [facility] plan in attempt to get an override for this medication.  Sent them Dr Burr Medico dictation and NCCN guidelines showing standard of care.  Appeal faxed to [phone]  Timeframe for reply is 30 days.  Will continue to monitor.  Thank you  Henreitta Leber, PharmD Oral Oncology Navigator

## 2016-09-20 ENCOUNTER — Telehealth: Payer: Self-pay | Admitting: Radiation Therapy

## 2016-09-20 NOTE — Telephone Encounter (Signed)
I returned a call from [place] wife to clarify his upcoming MRI and Follow-up appointment with Dr. Isidore Moos. While we were on the phone, he asked if I would check into some billing information he has called for a couple weeks ago. He needs the itemized billing for his hospital stay from 03/23/15-his discharge in April to file for his insurance policy. He said that he called a couple weeks ago and was told that it would be sent to his home, but he has yet to receive it. I called the cashier's office and left a message requesting that this information be sent out to Mr. Cornfield ASAP.   Mont Dutton R.T.(R)(T) Special Procedures Navigator

## 2016-09-23 ENCOUNTER — Ambulatory Visit: Payer: Commercial Managed Care - PPO | Admitting: Neurology

## 2016-09-24 ENCOUNTER — Ambulatory Visit
Admission: RE | Admit: 2016-09-24 | Discharge: 2016-09-24 | Disposition: A | Discharge status: Home / Self Care | Payer: Commercial Managed Care - PPO | Source: Ambulatory Visit | Attending: Radiation Oncology | Admitting: Radiation Oncology

## 2016-09-24 DIAGNOSIS — C7931 Secondary malignant neoplasm of brain: Secondary | ICD-10-CM

## 2016-09-24 DIAGNOSIS — C7949 Secondary malignant neoplasm of other parts of nervous system: Principal | ICD-10-CM

## 2016-09-24 MED ORDER — GADOBENATE DIMEGLUMINE 529 MG/ML IV SOLN
20.0000 mL | Freq: Once | INTRAVENOUS | Status: AC | PRN
  Administered 2016-09-24: 20 mL via INTRAVENOUS

## 2016-09-25 ENCOUNTER — Ambulatory Visit
Admission: RE | Admit: 2016-09-25 | Discharge: 2016-09-25 | Disposition: A | Discharge status: Home / Self Care | Payer: Commercial Managed Care - PPO | Source: Ambulatory Visit | Attending: Radiation Oncology | Admitting: Radiation Oncology

## 2016-09-25 ENCOUNTER — Encounter: Payer: Self-pay | Admitting: Radiation Oncology

## 2016-09-25 DIAGNOSIS — Z510 Encounter for antineoplastic radiation therapy: Secondary | ICD-10-CM | POA: Diagnosis present

## 2016-09-25 DIAGNOSIS — Z79899 Other long term (current) drug therapy: Secondary | ICD-10-CM | POA: Diagnosis not present

## 2016-09-25 DIAGNOSIS — C7931 Secondary malignant neoplasm of brain: Secondary | ICD-10-CM | POA: Insufficient documentation

## 2016-09-25 DIAGNOSIS — C3432 Malignant neoplasm of lower lobe, left bronchus or lung: Secondary | ICD-10-CM | POA: Diagnosis not present

## 2016-09-25 NOTE — Progress Notes (Signed)
Mr. Toops is here for follow up of radiation completed 04/10/15 to his Left Superior frontal lobe. He denies pain. He reports he had a seizure last Wednesday morning. He was unable to talk and his which is his usual pattern when having a seizure. It lasted 2-3 minutes, and he was back to baselineHe also had a smaller one 2 weeks prior to that. He is able to tell when a seizure is coming, because his tongue will twitch. He reports headaches, but think it is sinus related. He denies confusion. He denies any other symptoms at this time. He does report a decreased appetite. He had a Brain MRI 09/24/16. He has a CT Chest scheduled in the next month related to a PET scan performed which showed some activity in his lung.   BP 125/66   Pulse 70   Temp 98.6 F (37 C)   Ht 6' (1.829 m)   Wt (!) 305 lb 12.8 oz (138.7 kg)   SpO2 93% Comment: room air  BMI 41.47 kg/m    Wt Readings from Last 3 Encounters:  09/25/16 (!) 305 lb 12.8 oz (138.7 kg)  09/24/16 (!) 312 lb (141.5 kg)  09/16/16 (!) 312 lb 8 oz (141.7 kg)

## 2016-09-25 NOTE — Progress Notes (Signed)
  SIMULATION AND TREATMENT PLANNING NOTE  Outpatient  DIAGNOSIS:     ICD-9-CM ICD-10-CM   1. Brain metastasis ([place]) 198.3 C79.31     NARRATIVE:  The patient was brought to the [facility].  Identity was confirmed.  All relevant records and images related to the planned course of therapy were reviewed.  The patient freely provided informed written consent to proceed with treatment after reviewing the details related to the planned course of therapy. The consent form was witnessed and verified by the simulation staff.    Then, the patient was set-up in a stable reproducible  supine position for radiation therapy.  An Aquaplast facemask was custom fitted to the patient's anatomy.  CT images were obtained.  Surface markings were placed.  The CT images were loaded into the planning software.      TREATMENT PLANNING NOTE: Treatment planning then occurred.  The radiation prescription was entered and confirmed.    A total of 3 medically necessary complex treatment devices were fabricated and supervised by me, in the form of an Aquaplast mask and 2 fields with MLCs to block globes, pharynx, spinal cord, lenses. Wedges will be used as needed for dose homogeneity.  MORE FIELDS WITH MLCs MAY BE ADDED IN DOSIMETRY for dose homogeneity.  I have requested : Isodose Plan.     The patient will receive 35 Gy in 14 fractions to his whole brain.  -----------------------------------  Eppie Gibson, MD

## 2016-09-25 NOTE — Progress Notes (Signed)
Radiation Oncology         [phone]) [phone] ________________________________  Name: Walter Wilkins. MRN: [id number]  Date: 09/25/2016  DOB: 11-May-1956  Follow-Up Visit Note  Outpatient  CC: Yong Channel, MD  Orson Eva, MD  Diagnosis and Prior Radiotherapy: C79.31 Brain metastases - non small cell lung cancer   ICD-9-CM ICD-10-CM   1. Brain metastasis ([place]) 198.3 C79.31     04/10/2015: Left superior frontal 24m target was treated to a prescription dose of 18 Gy.  Narrative:  The patient returns today for routine follow-up. PET scan on 09/16/16 showed a new 2 cm hypermetabolic right hilar lymph node with SUV max of 8.4 with no hypermetabolic mediastinal or axillary lymph nodes identified, pulmonary airspace disease in both the left upper and lower lobes with hypermetabolic activity (this is new since recent study on 06/03/2016 and this favors drug reaction or infection over neoplasm), and a tiny left pleural effusion. MRI of the brain on 09/24/16 showed at least 21 new subcentimeter brain metastases and a slightly decrease in size of the left frontal lobe lesion with mildly decreased surrounding edema.  The patient saw Dr. FBurr Medicoon 09/16/16. Current therapy is Alectinib 600 mg (twice daily started on 09/11/2015), Xgeva every 4 weeks (changed to every 3 months in 05/2016), and Brigatinib 30 mg (180 mg by mouth daily).  He denies pain. He reports he had a seizure last Wednesday morning. He was unable to talk and this is his usual pattern when having a seizure. Remained conscious with movements of tongue. It lasted 2-4 minutes, and he was back to baseline. He also had a smaller one 2 weeks prior to that. He is able to tell when a seizure is coming, because his tongue will twitch. He reports not losing consciousness. He reports headaches, but think it is sinus related. He denies confusion. He denies any other symptoms at this time. He does report a decreased appetite. The patient is not on  Decadron.  ALLERGIES:  has No Known Allergies.  Meds: Current Outpatient Prescriptions  Medication Sig Dispense Refill  . alectinib (ALECENSA) 150 MG capsule Take 4 capsules (600 mg total) by mouth 2 (two) times daily. 240 capsule 5  . ALPRAZolam (XANAX) 0.5 MG tablet TAKE ONE TABLET BY MOUTH THREE TIMES A DAY AS NEEDED. 90 tablet 0  . atorvastatin (LIPITOR) 40 MG tablet Take 40 mg by mouth at bedtime. Last Refill 08/22/2014    . blood glucose meter kit and supplies KIT Dispense based on patient and insurance preference. Use up to four times daily as directed. (FOR ICD-9 250.00, 250.01). 1 each 0  . Brigatinib 30 MG TABS Take 180 mg by mouth daily. 180 tablet 1  . carbamazepine (TEGRETOL) 200 MG tablet 1/2 tablet twice a day for 2 weeks, then take 1 tablet twice a day 60 tablet 3  . Cyanocobalamin (RA VITAMIN B-12 TR) 1000 MCG TBCR Take 1 tablet by mouth daily.    . DULoxetine (CYMBALTA) 30 MG capsule Take 30 mg by mouth daily. Total of 90 mg daily-increased 08/05/16    . DULoxetine (CYMBALTA) 60 MG capsule Take 60 mg by mouth.    . enoxaparin (LOVENOX) 100 MG/ML injection Inject 1 mL (100 mg total) into the skin daily. 30 Syringe 3  . glipiZIDE (GLUCOTROL) 5 MG tablet Take 5 mg by mouth daily before breakfast.    . levETIRAcetam (KEPPRA) 750 MG tablet TAKE 2 TABLETS (1,500 MG TOTAL) BY MOUTH 2 (TWO) TIMES DAILY. 120 tablet  5  . metFORMIN (GLUCOPHAGE) 1000 MG tablet     . mirtazapine (REMERON) 45 MG tablet TAKE 1 TABLET (45 MG TOTAL) BY MOUTH AT BEDTIME. 30 tablet 2  . Multiple Vitamin (MULTI-VITAMINS) TABS Take 1 tablet by mouth daily.    Marland Kitchen omeprazole (PRILOSEC) 20 MG capsule Take 20 mg by mouth daily. Last Refill 08/22/2014    . pentoxifylline (TRENTAL) 400 MG CR tablet TAKE ONE TABLET BY MOUTH DAILY FOR 1 WEEK. THEN TAKE ONE TABLET TWO TIMES A DAY. TAKE WITH FOOD. 60 tablet 5  . traZODone (DESYREL) 50 MG tablet Take 50 mg by mouth at bedtime.    . vitamin E 400 UNIT capsule TAKE ONE CAPSULE  (400 UNITS) DAILY FOR 1 WEEK. THEN TAKE ONE CAPSULE TWO TIMES A DAY 60 capsule 5  . Calcium Carbonate-Vitamin D (CALCIUM-VITAMIN D) 500-200 MG-UNIT tablet Take by mouth.    . ondansetron (ZOFRAN) 8 MG tablet Take 1 tablet (8 mg total) by mouth every 8 (eight) hours as needed for nausea or vomiting. (Patient not taking: Reported on 09/25/2016) 30 tablet 3   No current facility-administered medications for this encounter.     Physical Findings: The patient is in no acute distress. Patient is alert and oriented.  height is 6' (1.829 m) and weight is 305 lb 12.8 oz (138.7 kg) (abnormal). His temperature is 98.6 F (37 C). His blood pressure is 125/66 and his pulse is 70. His oxygen saturation is 93%. . General: Alert and oriented, in no acute distress HEENT: Head is normocephalic. Extraocular movements are intact. Oropharynx is clear. Chest: Lungs are clear to auscultation bilaterally. Heart has regular rate and rhythm. He does have a mumur loudest at the aortic and pulmonary valve regions. Musculoskeletal:  Symmetric strength and muscle tone throughout his extremities. Neurologic: Cranial nerves II through XII are grossly intact. No obvious focalities. Speech is fluent. Coordination is intact. Strength is intact and coordination is intact. Psychiatric: Judgment and insight are intact. Affect is appropriate.  KPS = 80  100 - Normal; no complaints; no evidence of disease. 90   - Able to carry on normal activity; minor signs or symptoms of disease. 80   - Normal activity with effort; some signs or symptoms of disease. 94   - Cares for self; unable to carry on normal activity or to do active work. 60   - Requires occasional assistance, but is able to care for most of his personal needs. 50   - Requires considerable assistance and frequent medical care. 24   - Disabled; requires special care and assistance. 97   - Severely disabled; hospital admission is indicated although death not imminent. 1    - Very sick; hospital admission necessary; active supportive treatment necessary. 10   - Moribund; fatal processes progressing rapidly. 0     - Dead  Karnofsky DA, Abelmann Gaffney, Craver LS and [facility] [phone]) The use of the nitrogen mustards in the palliative treatment of carcinoma: with particular reference to bronchogenic carcinoma Cancer 1 [phone]  Lab Findings: Lab Results  Component Value Date   WBC 9.2 09/16/2016   HGB 12.8 (L) 09/16/2016   HCT 38.9 09/16/2016   MCV 92.4 09/16/2016   PLT 196 09/16/2016    Radiographic Findings: Mr Jeri Cos WU Contrast  Result Date: 09/24/2016 CLINICAL DATA:  History of lung cancer with brain metastases. Prior stereotactic radiosurgery. Follow-up. EXAM: MRI HEAD WITHOUT AND WITH CONTRAST TECHNIQUE: Multiplanar, multiecho pulse sequences of the brain and surrounding structures  were obtained without and with intravenous contrast. CONTRAST:  5m MULTIHANCE GADOBENATE DIMEGLUMINE 529 MG/ML IV SOLN COMPARISON:  06/04/2016 FINDINGS: Brain: There is no evidence of acute infarct, midline shift, or extra-axial fluid collection. Ventricles and sulci are within normal limits for age. Small scattered foci of T2 hyperintensity in the cerebral white matter bilaterally are unchanged and nonspecific but compatible with mild chronic small vessel ischemic disease. The enhancing the peripherally enhancing lesion in the posterior left frontal lobe has mildly decreased in size, now measuring 26 x 14 x 17 mm (previously 26 x 17 x 23 mm). Surrounding edema has mildly improved. Chronic blood products are again seen. There are at least 21 new enhancing supratentorial and infratentorial brain lesions, all subcentimeter in size (annotated on series 10). The largest cerebellar lesion measures 3 mm in the left hemisphere (series 10, image 11). The largest cerebral lesion measures 5 mm in the right occipital lobe (series 10, image 46). There is no edema associated with any of these  lesions. Vascular: Major intracranial vascular flow voids are preserved. Skull and upper cervical spine: Osseous metastasis in the clivus is unchanged. Sinuses/Orbits: Unremarkable orbits. No significant inflammatory disease in the paranasal sinuses are mastoid air cells. Other: None. IMPRESSION: 1. At least 21 new subcentimeter brain metastases.  No edema. 2. Slightly decreased size of left frontal lobe lesion with mildly decreased surrounding edema. Electronically Signed   By: ALogan BoresM.D.   On: 09/24/2016 13:13   Nm Pet Image Restag (ps) Skull Base To Thigh  Result Date: 09/16/2016 CLINICAL DATA:  Subsequent treatment strategy for metastatic left lower lobe lung adenocarcinoma. Undergoing chemotherapy. Restaging. EXAM: NUCLEAR MEDICINE PET SKULL BASE TO THIGH TECHNIQUE: 15.7 mCi F-18 FDG was injected intravenously. Full-ring PET imaging was performed from the skull base to thigh after the radiotracer. CT data was obtained and used for attenuation correction and anatomic localization. FASTING BLOOD GLUCOSE:  Value: 162 mg/dl COMPARISON:  06/03/2016 FINDINGS: NECK No hypermetabolic lymph nodes in the neck. CHEST New 2 cm hypermetabolic right hilar lymph node seen with SUV max of 8.4. No hypermetabolic mediastinal or axillary lymph nodes identified. Pulmonary airspace disease is seen in both the left upper and lower lobes which shows hypermetabolic activity. This is new since recent study on 06/03/2016, and this favors drug reaction or infection over neoplasm. Tiny left pleural effusion noted. Right lung remains clear. ABDOMEN/PELVIS No abnormal hypermetabolic activity within the liver, pancreas, adrenal glands, or spleen. No hypermetabolic lymph nodes in the abdomen or pelvis. Previous gastric bypass surgery and cholecystectomy again noted. IVC filter remains in place. Stable periumbilical hernia containing transverse colon, and fat containing left inguinal hernia. SKELETON Diffuse sclerotic bone  metastases remain stable, and show no associated hypermetabolic activity. IMPRESSION: New hypermetabolic left upper and lower lobe pulmonary airspace disease since 06/03/2016 exam, most likely due to drug reaction or infection, with neoplasm considered much less likely. New mild hypermetabolic right hilar lymphadenopathy. Stable sclerotic bone metastases, without associated hypermetabolic activity. Electronically Signed   By: JEarle GellM.D.   On: 09/16/2016 10:05    Impression/Plan MRI of the brain on 09/24/16 showed at least 21 new subcentimeter brain metastases.  PET scan on 09/16/16 showed a new 2 cm hypermetabolic right hilar lymph node with SUV max of 8.4. CT scan of the chest w/ contrast is scheduled on 09/29/2016. The patient is scheduled to see Dr. WJannifer Franklin of neurology, on 10/02/2016 and Dr. FBurr Medicothe same day.  At this point, the patient would  potentially benefit from whole brain irradiation. There are pros and cons associated with this potential treatment option. Whole brain radiotherapy would treat the known metastatic deposits and help provide some reduction of risk for future brain metastases. However, whole brain radiotherapy carries potential risks including hair loss, subacute somnolence, and neurocognitive changes including a possible reduction in short-term memory  Today, I reviewed the findings and workup thus far with the patient. We discussed the dilemma regarding whole brain radiotherapy. We discussed the pros and cons. We also discussed the logistics and delivery. We reviewed the results associated with the treatment described above. The patient seems to understand the treatment options and would like to proceed with whole brain radiosurgery. The patient signed a consent form and a copy was placed in his medical chart. CT simulation is scheduled today at 1PM.  I will reach out to Dr Burr Medico regarding his systemic therapy, and discuss whether it will be held during RT.  I also am concerned  he may in fact have disease in his chest that is seeding his brain.  CT chest pending as above to follow.  _____________________________________   Eppie Gibson, MD  This document serves as a record of services personally performed by Eppie Gibson, MD. It was created on her behalf by Darcus Austin, a trained medical scribe. The creation of this record is based on the scribe's personal observations and the provider's statements to them. This document has been checked and approved by the attending provider.

## 2016-09-30 ENCOUNTER — Ambulatory Visit
Admission: RE | Admit: 2016-09-30 | Discharge: 2016-09-30 | Disposition: A | Discharge status: Home / Self Care | Payer: Commercial Managed Care - PPO | Source: Ambulatory Visit | Attending: Radiation Oncology | Admitting: Radiation Oncology

## 2016-09-30 ENCOUNTER — Other Ambulatory Visit: Payer: Self-pay | Admitting: Hematology

## 2016-09-30 DIAGNOSIS — C7931 Secondary malignant neoplasm of brain: Secondary | ICD-10-CM

## 2016-09-30 DIAGNOSIS — Z510 Encounter for antineoplastic radiation therapy: Secondary | ICD-10-CM | POA: Diagnosis not present

## 2016-09-30 NOTE — Progress Notes (Signed)
   Weekly Management Note:  Outpatient    ICD-9-CM ICD-10-CM   1. Brain metastasis ([facility]) 198.3 C79.31     Current Dose:  2.5 Gy  Projected Dose: 35 Gy   Narrative:  The patient presents for routine under treatment assessment at fraction 1 to whole brain.  CBCT/MVCT images/Port film x-rays were reviewed.  The chart was checked. No complaints  Physical Findings:  vitals were not taken for this visit.  Wt Readings from Last 3 Encounters:  09/25/16 (!) 305 lb 12.8 oz (138.7 kg)  09/24/16 (!) 312 lb (141.5 kg)  09/16/16 (!) 312 lb 8 oz (141.7 kg)   NAD, well appearing  Impression:  The patient is doing well.  Plan:  Continue radiotherapy as planned.    ________________________________   Eppie Gibson, M.D.

## 2016-10-01 ENCOUNTER — Ambulatory Visit
Admission: RE | Admit: 2016-10-01 | Discharge: 2016-10-01 | Disposition: A | Discharge status: Home / Self Care | Payer: Commercial Managed Care - PPO | Source: Ambulatory Visit | Attending: Radiation Oncology | Admitting: Radiation Oncology

## 2016-10-01 ENCOUNTER — Inpatient Hospital Stay: Admission: RE | Admit: 2016-10-01 | Payer: Self-pay | Source: Ambulatory Visit

## 2016-10-01 DIAGNOSIS — Z510 Encounter for antineoplastic radiation therapy: Secondary | ICD-10-CM | POA: Diagnosis not present

## 2016-10-01 DIAGNOSIS — C7931 Secondary malignant neoplasm of brain: Secondary | ICD-10-CM

## 2016-10-01 MED ORDER — SONAFINE EX EMUL
1.0000 "application " | Freq: Two times a day (BID) | CUTANEOUS | Status: DC
  Administered 2016-10-01: 1 via TOPICAL

## 2016-10-01 NOTE — Progress Notes (Signed)
Pt here for patient teaching.  Pt given Radiation and You booklet, skin care instructions and Sonafine. Pt reports they have not watched the Radiation Therapy Education video, but was given the link to watch at home.  Reviewed areas of pertinence such as fatigue, hair loss, skin changes and headache . Pt able to give teach back of to pat skin, use unscented/gentle soap and drink plenty of water,apply Sonafine bid, avoid applying anything to skin within 4 hours of treatment and to use an electric razor if they must shave. Pt verbalizes understanding of information given and will contact nursing with any questions or concerns.     [email or web address]

## 2016-10-02 ENCOUNTER — Ambulatory Visit
Admission: RE | Admit: 2016-10-02 | Discharge: 2016-10-02 | Disposition: A | Discharge status: Home / Self Care | Payer: Commercial Managed Care - PPO | Source: Ambulatory Visit | Attending: Radiation Oncology | Admitting: Radiation Oncology

## 2016-10-02 ENCOUNTER — Ambulatory Visit: Payer: Commercial Managed Care - PPO

## 2016-10-02 ENCOUNTER — Other Ambulatory Visit: Payer: Self-pay | Admitting: Hematology

## 2016-10-02 DIAGNOSIS — Z510 Encounter for antineoplastic radiation therapy: Secondary | ICD-10-CM | POA: Diagnosis not present

## 2016-10-02 MED ORDER — ALBUTEROL SULFATE HFA 108 (90 BASE) MCG/ACT IN AERS: 2.0000 | INHALATION_SPRAY | Freq: Four times a day (QID) | RESPIRATORY_TRACT | 1 refills | Status: AC | PRN

## 2016-10-03 ENCOUNTER — Ambulatory Visit
Admission: RE | Admit: 2016-10-03 | Discharge: 2016-10-03 | Disposition: A | Discharge status: Home / Self Care | Payer: Commercial Managed Care - PPO | Source: Ambulatory Visit | Attending: Radiation Oncology | Admitting: Radiation Oncology

## 2016-10-03 DIAGNOSIS — Z510 Encounter for antineoplastic radiation therapy: Secondary | ICD-10-CM | POA: Diagnosis not present

## 2016-10-04 ENCOUNTER — Ambulatory Visit
Admission: RE | Admit: 2016-10-04 | Discharge: 2016-10-04 | Disposition: A | Discharge status: Home / Self Care | Payer: Commercial Managed Care - PPO | Source: Ambulatory Visit | Attending: Radiation Oncology | Admitting: Radiation Oncology

## 2016-10-04 DIAGNOSIS — Z510 Encounter for antineoplastic radiation therapy: Secondary | ICD-10-CM | POA: Diagnosis not present

## 2016-10-07 ENCOUNTER — Encounter: Payer: Self-pay | Admitting: Radiation Oncology

## 2016-10-07 ENCOUNTER — Ambulatory Visit
Admission: RE | Admit: 2016-10-07 | Discharge: 2016-10-07 | Disposition: A | Discharge status: Home / Self Care | Payer: Commercial Managed Care - PPO | Source: Ambulatory Visit | Attending: Radiation Oncology | Admitting: Radiation Oncology

## 2016-10-07 VITALS — BP 127/80 | HR 92 | Temp 98.2°F | Ht 72.0 in | Wt 302.0 lb

## 2016-10-07 DIAGNOSIS — C7931 Secondary malignant neoplasm of brain: Secondary | ICD-10-CM

## 2016-10-07 DIAGNOSIS — Z510 Encounter for antineoplastic radiation therapy: Secondary | ICD-10-CM | POA: Diagnosis not present

## 2016-10-07 NOTE — Progress Notes (Signed)
   Weekly Management Note:  Outpatient    ICD-9-CM ICD-10-CM   1. Brain metastasis ([facility]) 198.3 C79.31     Current Dose:  15 Gy  Projected Dose: 35 Gy   Narrative:  The patient presents for routine under treatment assessment at fraction 1 to whole brain.  CBCT/MVCT images/Port film x-rays were reviewed.  The chart was checked. Decreased appetite, taste.  Enjoyed the beach this weekend. Slight HAs occasionally   Physical Findings:  height is 6' (1.829 m) and weight is 302 lb (137 kg) (abnormal). His temperature is 98.2 F (36.8 C). His blood pressure is 127/80 and his pulse is 92. His oxygen saturation is 92%.   Wt Readings from Last 3 Encounters:  10/07/16 (!) 302 lb (137 kg)  09/25/16 (!) 305 lb 12.8 oz (138.7 kg)  09/24/16 (!) 312 lb (141.5 kg)   NAD, well appearing  Impression:  The patient is doing well.  Plan:  Continue radiotherapy as planned.   If continued significant weight loss, will refer to nutrition  ________________________________   Eppie Gibson, M.D.

## 2016-10-07 NOTE — Progress Notes (Signed)
Mr. Herbster is here for his 6th fraction of radiation to his whole brain. He denies pain. He does report tingling around his hairline which comes and goes. He is using sonafine to this area twice daily. He also reports one episode of nausea while in the shower this morning. He took a zofran tablet with relief. He has some mild fatigue. He has a decreased appetite, and tells me nothing appeals to him to eat.  He reports a slight headache at times.   BP 127/80   Pulse 92   Temp 98.2 F (36.8 C)   Ht 6' (1.829 m)   Wt (!) 302 lb (137 kg)   SpO2 92% Comment: room air  BMI 40.96 kg/m    Wt Readings from Last 3 Encounters:  10/07/16 (!) 302 lb (137 kg)  09/25/16 (!) 305 lb 12.8 oz (138.7 kg)  09/24/16 (!) 312 lb (141.5 kg)

## 2016-10-08 ENCOUNTER — Ambulatory Visit
Admission: RE | Admit: 2016-10-08 | Discharge: 2016-10-08 | Disposition: A | Discharge status: Home / Self Care | Payer: Commercial Managed Care - PPO | Source: Ambulatory Visit | Attending: Radiation Oncology | Admitting: Radiation Oncology

## 2016-10-08 ENCOUNTER — Encounter: Payer: Self-pay | Admitting: Pharmacist

## 2016-10-08 DIAGNOSIS — Z510 Encounter for antineoplastic radiation therapy: Secondary | ICD-10-CM | POA: Diagnosis not present

## 2016-10-08 NOTE — Progress Notes (Signed)
Oral Chemotherapy Pharmacist Encounter  Received notification from [employer] that appeal for patient's brigatinib has been approved through 10/07/17. PA ID# [id number].  I called [facility] to alert them of PA approval and to run script for copay information. Copay $0. They will order brigatinib to be able to dispense tomorrow (11/18), they will call patient when ready for pick-up.  I will follow up with Dr. Burr Medico about brigatinib start date prior to calling patient for initial counseling. Patient scheduled with Dr. Burr Medico on 10/09/2016.  Oral Chemo Clinic will continue to follow.  Johny Drilling, PharmD, BCPS 10/08/2016  10:02 AM Oral Chemotherapy Clinic [phone]

## 2016-10-09 ENCOUNTER — Ambulatory Visit
Admission: RE | Admit: 2016-10-09 | Discharge: 2016-10-09 | Disposition: A | Discharge status: Home / Self Care | Payer: Commercial Managed Care - PPO | Source: Ambulatory Visit | Attending: Radiation Oncology | Admitting: Radiation Oncology

## 2016-10-09 DIAGNOSIS — Z510 Encounter for antineoplastic radiation therapy: Secondary | ICD-10-CM | POA: Diagnosis not present

## 2016-10-10 ENCOUNTER — Ambulatory Visit
Admission: RE | Admit: 2016-10-10 | Discharge: 2016-10-10 | Disposition: A | Discharge status: Home / Self Care | Payer: Commercial Managed Care - PPO | Source: Ambulatory Visit | Attending: Radiation Oncology | Admitting: Radiation Oncology

## 2016-10-10 DIAGNOSIS — Z510 Encounter for antineoplastic radiation therapy: Secondary | ICD-10-CM | POA: Diagnosis not present

## 2016-10-11 ENCOUNTER — Ambulatory Visit
Admission: RE | Admit: 2016-10-11 | Discharge: 2016-10-11 | Disposition: A | Discharge status: Home / Self Care | Payer: Commercial Managed Care - PPO | Source: Ambulatory Visit | Attending: Radiation Oncology | Admitting: Radiation Oncology

## 2016-10-11 ENCOUNTER — Ambulatory Visit (HOSPITAL_COMMUNITY)
Admission: RE | Admit: 2016-10-11 | Discharge: 2016-10-11 | Disposition: A | Discharge status: Home / Self Care | Payer: Commercial Managed Care - PPO | Source: Ambulatory Visit | Attending: Hematology | Admitting: Hematology

## 2016-10-11 DIAGNOSIS — J90 Pleural effusion, not elsewhere classified: Secondary | ICD-10-CM | POA: Diagnosis not present

## 2016-10-11 DIAGNOSIS — C3492 Malignant neoplasm of unspecified part of left bronchus or lung: Secondary | ICD-10-CM | POA: Insufficient documentation

## 2016-10-11 DIAGNOSIS — Z510 Encounter for antineoplastic radiation therapy: Secondary | ICD-10-CM | POA: Diagnosis not present

## 2016-10-11 DIAGNOSIS — C7951 Secondary malignant neoplasm of bone: Secondary | ICD-10-CM | POA: Diagnosis not present

## 2016-10-11 MED ORDER — IOPAMIDOL (ISOVUE-300) INJECTION 61%
75.0000 mL | Freq: Once | INTRAVENOUS | Status: AC | PRN
  Administered 2016-10-11: 75 mL via INTRAVENOUS

## 2016-10-14 ENCOUNTER — Ambulatory Visit
Admission: RE | Admit: 2016-10-14 | Discharge: 2016-10-14 | Disposition: A | Discharge status: Home / Self Care | Payer: Commercial Managed Care - PPO | Source: Ambulatory Visit | Attending: Radiation Oncology | Admitting: Radiation Oncology

## 2016-10-14 ENCOUNTER — Encounter: Payer: Self-pay | Admitting: Radiation Oncology

## 2016-10-14 VITALS — BP 125/70 | HR 87 | Temp 98.7°F | Ht 72.0 in | Wt 303.0 lb

## 2016-10-14 DIAGNOSIS — C3490 Malignant neoplasm of unspecified part of unspecified bronchus or lung: Secondary | ICD-10-CM

## 2016-10-14 DIAGNOSIS — Z515 Encounter for palliative care: Secondary | ICD-10-CM | POA: Diagnosis not present

## 2016-10-14 DIAGNOSIS — Z66 Do not resuscitate: Secondary | ICD-10-CM | POA: Diagnosis not present

## 2016-10-14 DIAGNOSIS — Z510 Encounter for antineoplastic radiation therapy: Secondary | ICD-10-CM | POA: Diagnosis not present

## 2016-10-14 DIAGNOSIS — C7931 Secondary malignant neoplasm of brain: Secondary | ICD-10-CM

## 2016-10-14 NOTE — Progress Notes (Signed)
Walter Wilkins presents for his 11th fraction of radiation to his Whole Brain. He denies pain. He does report fatigue. He is napping daily. He reports increased shortness of breath with activity. He has also been eating less. He will tolerate two meals a day, but is not hungry for his third meal. He also reports anxiety, and depressed mood over diagnosis. They saw palliative care today for assessment. She has recommended a combination anxiety/ depression medicine. He does have appointments to see Dr. Jannifer Franklin and Dr. Burr Medico tomorrow. The skin to his radiation area is normal appearing, and he is using sonafine to areas being treated. He has not lost any hair at this time. He has had some nausea, which has been relieved with zofran. He has had several dull headaches, but has not needed any pain medicine for this.   BP 125/70   Pulse 87   Temp 98.7 F (37.1 C)   Ht 6' (1.829 m)   Wt (!) 303 lb (137.4 kg)   SpO2 95% Comment: room air  BMI 41.09 kg/m    Wt Readings from Last 3 Encounters:  10/14/16 (!) 303 lb (137.4 kg)  10/07/16 (!) 302 lb (137 kg)  09/25/16 (!) 305 lb 12.8 oz (138.7 kg)

## 2016-10-14 NOTE — Progress Notes (Signed)
   Weekly Management Note:  Outpatient    ICD-9-CM ICD-10-CM   1. Brain metastasis ([facility]) 198.3 C79.31     Current Dose:  27.5 Gy  Projected Dose: 35 Gy   Narrative:  The patient presents for routine under treatment assessment at fraction 1 to whole brain.  CBCT/MVCT images/Port film x-rays were reviewed.  The chart was checked.   Slight HAs occasionally .  No hair loss.  Saw palliative care today.  Physical Findings:  height is 6' (1.829 m) and weight is 303 lb (137.4 kg) (abnormal). His temperature is 98.7 F (37.1 C). His blood pressure is 125/70 and his pulse is 87. His oxygen saturation is 95%.   Wt Readings from Last 3 Encounters:  10/14/16 (!) 303 lb (137.4 kg)  10/07/16 (!) 302 lb (137 kg)  09/25/16 (!) 305 lb 12.8 oz (138.7 kg)   NAD, well appearing No thrush or alopecia. Forehead is erythematous. ambulatory   Impression:  The patient is doing well.  Plan:  Continue radiotherapy as planned.  Sonafine for skin. F/u in 60mo________________________________   SEppie Gibson M.D.

## 2016-10-14 NOTE — Consult Note (Signed)
Consultation Note Date: 10/14/2016   Patient Name: Walter Wilkins.  DOB: 1956-10-01  MRN: [id number]  Age / Sex: 60 y.o., male  PCP: [name]. [name], MD Referring Physician: Eppie Gibson, MD  Reason for Consultation: Establishing goals of care and Psychosocial/spiritual support  HPI/Patient Profile: 60 y.o. male  with past medical history (per oncology note)   Oncology History   Metastatic lung cancer (metastasis from lung to other site)   Staging form: Lung, AJCC 7th Edition     Clinical: Stage IV (T3, N3, M1b) - Unsigned       Metastatic lung cancer (metastasis from lung to other site) ([place])   03/24/2015 Imaging    brian MRI 1.9 cm high left frontal lobe mass with minimal surrounding edema     03/24/2015 Imaging    CT showed Multiple low-density lesions in the liver worrisome for metastatic disease. Consolidative infiltrate in the left lower lobe with interstitial infiltrate in the left upper lobe. Single enlarged left hilar lymph node.     03/27/2015 Initial Diagnosis    Metastatic lung cancer (metastasis from lung to other site)     03/27/2015 Pathology Results    Metastatic adenocarcinoma, IHC positive for CK 7, TTF-1 and Napsin A. Negative for CK 20, CD X2, PSA, consistent with lung primary     03/27/2015 Miscellaneous    Foundation One test showed (+) EML4-ALK fusion (variant 2), CDKN2A/B loss, SMAD4 R361H.      04/07/2015 Imaging    PET scan showed hypermetabolic soft tissue infiltrates in the left lower lobe lung, bilateral mediastinal adenopathy,widespread metastatic disease, including to thoracic, low cervical nodes, liver and bones.     04/10/2015 - 04/10/2015 Radiation Therapy    SRS to the brain met      04/27/2015 - 09/08/2015 Chemotherapy    Crizotinib 282m bid, stopped due to disease preogression      08/08/2015 - 08/12/2015 [facility] Ad[facility]      [facility] showed small hemorrhage. Lovenox was held,  and he had a IVC filter Placed. Lovenox was resumed one month later.      09/09/2015 -  Chemotherapy    Alectinib [id number]      03/04/2016 Imaging    Decreased in size of the region of abnormal enhancement in the left posterior frontal cortical region since the study of January, marked reduction in regional vasogenic edema. 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 no new lesions     03/21/2016 Imaging    PET scan showed complete metabolic response, mild residual nodular scarring in the left lower lobe, residual hepatic lesion measuring up to 1.5 cm, multifocal sclerotic osseous lesions, without hypermetabolic of the above lesions.     09/16/2016 Progression    PET scan showed new hypermetabolic left upper and lower lobe pulmonary airspace disease, mild hypermetabolic right hilar adenopathy, stable sclerotic bone metastasis, without hypermetabolic activity.     09/24/2016 Imaging    Brain MRI with  and without contrast showed at least a 21 you subcentimeter brain metastasis, no edema.     09/30/2016 -  Radiation Therapy         10/11/2016 Imaging    CT chest with contrast showed progressive air space consolidation within the left upper lobe and low lobe, new moderate volume left pleural effusion.       Most recently he underwent a CT of head which showed a 1.9 cm lesion in the left frontal lobe with surrounding edema, receiving whole brain radiation,  CT of the chest reviewed no PE, but significant infiltrative consolidation in the left lower lobe, it was felt to be aspiration pneumonia. CT of the abdomen reviewed multiple diffuse liver metastasis. He underwent a liver biopsy which showed adenocarcinoma.   He has increasing weakness and SOB, he await f/u with Dr Burr Medico and Dr Jannifer Franklin tomorrow.  Clinical Assessment and Goals of  Care:   This NP Wadie Lessen reviewed medical records, received report from team, assessed the patient and then meet with  the patient and his wife  to discuss diagnosis, prognosis, GOC, EOL wishes disposition and options.  A detailed discussion was had today regarding advanced directives.  Concepts specific to code status, artifical feeding and hydration, continued IV antibiotics and rehospitalization was had.   Values and goals of care important to patient and family were attempted to be elicited.  He is clear that comfort and quality are the priority.  DNR documented and no desire for artifical feeding now or in the future  MOST form introduced     Concept of Hospice and Palliative Care were discussed  Questions and concerns addressed.   Family encouraged to call with questions or concerns.  PMT will continue to support holistically.  SUMMARY OF RECOMMENDATIONS    Code Status/Advance Care Planning:  DNR   Symptom Management:   Patient wishes to f/u with oncology in the morning as well as neurology before making any changes to his medications, specific to his reported feelings of depression.    We discussed the concept of Preparatory grief as  normal not a pathological life cycle event, as one prepares  for  final separation from the world.  Discussed importance of expressing his needs, open communication with family and coping strategies    Additional Recommendations (Limitations, Scope, Preferences):  Patient is open to all offered and available medical interventions to prolong quality of life.  Psycho-social/Spiritual:   Patient is tearful when he speaks about his disease and the difficulties of living with serious life-limiting disease.  Both he and his wife were able to verbalize thoughts and feelings and frustrations/stressors in living with serious illness     Additional Recommendations: Education on Hospice  Prognosis:   Unable to determine- currently looking to  precede with offered chemo/ ALK inhibitor/Brigatinib under Dr Ernestina Penna care.  [facility] is assisting family in accessing medication.     Primary Diagnoses: Present on Admission: **None**   I have reviewed the medical record, interviewed the patient and family, and examined the patient. The following aspects are pertinent.  Past Medical History:  Diagnosis Date  . Acid reflux   . Brain mass 03/24/15   ct head - left frontal lobe mass consistent with a brain metastasis  . Cancer ([place])    mass on lung, brain, sinuses, liver and bones  . Diabetes mellitus without complication ([place])   . H/O gastric bypass    a. ~2011 at [facility].  . Seizures [facility])  03/23/15   Social History   Social History  . Marital status: Married    Spouse name: N/A  . Number of children: 2  . Years of education: 14   Occupational History  .      not currently working   Social History Main Topics  . Smoking status: Never Smoker  . Smokeless tobacco: Never Used  . Alcohol use 0.0 oz/week     Comment: Occasionally socially - twice a week   . Drug use: No  . Sexual activity: Not on file   Other Topics Concern  . Not on file   Social History Narrative   Patient drinks about 2-3 cups of coffee daily.   Patient is right handed.   Family History  Problem Relation Age of Onset  . Valvular heart disease Father     H/o pig valve  . Cancer Father     skin cancer   . Breast cancer Mother   . Cancer Mother 19    breast cancer   . Cancer Brother 42    base of tongue   . Cancer Maternal Uncle     throat cancer   . Cancer Maternal Uncle 90    colon cancer, and prostate cancer   . Cancer Cousin     lung cancer   . Sudden death Neg Hx     No family history of sudden cardiac death   Scheduled Meds: Continuous Infusions: PRN Meds:. Medications Prior to Admission:  Prior to Admission medications   Medication Sig Start Date End Date Taking? Authorizing Provider  albuterol (PROVENTIL HFA) 108 (90  Base) MCG/ACT inhaler Inhale 2 puffs into the lungs every 6 (six) hours as needed for wheezing or shortness of breath. 10/02/16   [name], MD  alectinib (ALECENSA) 150 MG capsule Take 4 capsules (600 mg total) by mouth 2 (two) times daily. 09/03/16   [name], MD  ALPRAZolam Duanne Moron) 0.5 MG tablet TAKE ONE TABLET BY MOUTH THREE TIMES A DAY AS NEEDED. 09/18/16   [name], MD  atorvastatin (LIPITOR) 40 MG tablet Take 40 mg by mouth at bedtime. Last Refill 08/22/2014    Historical Provider, MD  blood glucose meter kit and supplies KIT Dispense based on patient and insurance preference. Use up to four times daily as directed. (FOR ICD-9 250.00, 250.01). 03/28/15   Orson Eva, MD  Brigatinib 30 MG TABS Take 180 mg by mouth daily. 09/18/16   [name], MD  Calcium Carbonate-Vitamin D (CALCIUM-VITAMIN D) 500-200 MG-UNIT tablet Take by mouth. 06/19/15 06/18/16  Historical Provider, MD  carbamazepine (TEGRETOL) 200 MG tablet 1/2 tablet twice a day for 2 weeks, then take 1 tablet twice a day 06/10/16   Kathrynn Ducking, MD  Cyanocobalamin (RA VITAMIN B-12 TR) 1000 MCG TBCR Take 1 tablet by mouth daily. 08/05/16   Historical Provider, MD  DULoxetine (CYMBALTA) 30 MG capsule Take 30 mg by mouth daily. Total of 90 mg daily-increased 08/05/16    Historical Provider, MD  DULoxetine (CYMBALTA) 60 MG capsule Take 60 mg by mouth.    Historical Provider, MD  enoxaparin (LOVENOX) 100 MG/ML injection Inject 1 mL (100 mg total) into the skin daily. 04/11/16   [name], MD  glipiZIDE (GLUCOTROL) 5 MG tablet Take 5 mg by mouth daily before breakfast.    Historical Provider, MD  levETIRAcetam (KEPPRA) 750 MG tablet TAKE 2 TABLETS (1,500 MG TOTAL) BY MOUTH 2 (TWO) TIMES DAILY. 08/12/16   Kathrynn Ducking, MD  metFORMIN (GLUCOPHAGE) 1000 MG tablet  05/08/16   Historical Provider, MD  mirtazapine (REMERON) 45 MG tablet TAKE 1 TABLET (45 MG TOTAL) BY MOUTH AT BEDTIME. 06/19/16   [name], MD  Multiple Vitamin (MULTI-VITAMINS) TABS Take 1 tablet  by mouth daily.    Historical Provider, MD  omeprazole (PRILOSEC) 20 MG capsule Take 20 mg by mouth daily. Last Refill 08/22/2014    Historical Provider, MD  ondansetron (ZOFRAN) 8 MG tablet Take 1 tablet (8 mg total) by mouth every 8 (eight) hours as needed for nausea or vomiting. 07/03/15   [name], MD  pentoxifylline (TRENTAL) 400 MG CR tablet TAKE ONE TABLET BY MOUTH DAILY FOR 1 WEEK. THEN TAKE ONE TABLET TWO TIMES A DAY. TAKE WITH FOOD. 08/06/16   Eppie Gibson, MD  traZODone (DESYREL) 50 MG tablet Take 50 mg by mouth at bedtime.    Historical Provider, MD  vitamin E 400 UNIT capsule TAKE ONE CAPSULE (400 UNITS) DAILY FOR 1 WEEK. THEN TAKE ONE CAPSULE TWO TIMES A DAY 08/06/16   Eppie Gibson, MD   No Known Allergies Review of Systems  Constitutional: Positive for activity change, appetite change, fatigue and unexpected weight change.  Respiratory: Positive for shortness of breath.   Neurological: Positive for weakness.    Physical Exam  Constitutional: He appears ill.  -obese, weak  Skin: Skin is warm and dry.  -extreme "tanned " skin- patient tells me [place] is at the beach    Vital Signs: There were no vitals taken for this visit.         SpO2:   O2 Device:  O2 Flow Rate: .   IO: Intake/output summary: No intake or output data in the 24 hours ending 10/14/16 1517  LBM:   Baseline Weight:   Most recent weight:       Palliative Assessment/Data: 70%   Family to call back after f/u appointments and we will plan to meet next week.  Time In: 1130 Time Out: 1300 Time Total: 90 min  Greater than 50%  of this time was spent counseling and coordinating care related to the above assessment and plan.  Signed by: Wadie Lessen, NP   Please contact Palliative Medicine Team phone at [phone] for questions and concerns.  For individual provider: See Shea Evans

## 2016-10-14 NOTE — Progress Notes (Signed)
Oral Chemotherapy Pharmacist Encounter Alunbrig -- UPDATE 10/14/2016 8:38 AM  Received notification from [employer] that Alunbrig had been approved in error and will now be denied due to the fact that they do not yet cover the medication. Dr. Feng called the provided number on denial to perform peer-to-peer, and was told after lengthy discussion that there was no way to appeal the decision through that process.  I called [employer] to have denial resent so I can request an expedited appeal. Appeal packet compiled with letter, last OV note, NCCN guidelines, and and clinical trial. This was faxed to [employer] appeals department at [phone]. The request was marked urgent.  I also met patient and wife in the [facility] lobby this morning to complete the Alunbrig 1point application through [employer] for the medication to be supplied by the manufacturer in case the request for formulary exception is denied by insurance carrier.  Oral Chemo Clinic will continue to follow.  Jesse , PharmD, BCPS 10/14/2016  8:38 AM Oral Chemotherapy Clinic [phone]  

## 2016-10-15 ENCOUNTER — Ambulatory Visit: Payer: Commercial Managed Care - PPO

## 2016-10-15 ENCOUNTER — Telehealth: Payer: Self-pay | Admitting: Neurology

## 2016-10-15 ENCOUNTER — Encounter: Payer: Self-pay | Admitting: Pharmacist

## 2016-10-15 ENCOUNTER — Ambulatory Visit: Payer: Self-pay | Admitting: Neurology

## 2016-10-15 ENCOUNTER — Encounter: Payer: Self-pay | Admitting: Hematology

## 2016-10-15 ENCOUNTER — Telehealth: Payer: Self-pay | Admitting: *Deleted

## 2016-10-15 ENCOUNTER — Other Ambulatory Visit (HOSPITAL_COMMUNITY): Payer: Commercial Managed Care - PPO

## 2016-10-15 ENCOUNTER — Inpatient Hospital Stay (HOSPITAL_COMMUNITY)
Admission: AD | Admit: 2016-10-15 | Discharge: 2016-10-23 | DRG: 180 | Disposition: E | Payer: Commercial Managed Care - PPO | Source: Ambulatory Visit | Attending: Internal Medicine | Admitting: Internal Medicine

## 2016-10-15 ENCOUNTER — Ambulatory Visit (HOSPITAL_BASED_OUTPATIENT_CLINIC_OR_DEPARTMENT_OTHER): Payer: Commercial Managed Care - PPO | Admitting: Hematology

## 2016-10-15 ENCOUNTER — Other Ambulatory Visit (HOSPITAL_BASED_OUTPATIENT_CLINIC_OR_DEPARTMENT_OTHER): Payer: Commercial Managed Care - PPO

## 2016-10-15 ENCOUNTER — Ambulatory Visit (HOSPITAL_COMMUNITY): Payer: Commercial Managed Care - PPO

## 2016-10-15 ENCOUNTER — Inpatient Hospital Stay (HOSPITAL_COMMUNITY): Payer: Commercial Managed Care - PPO

## 2016-10-15 ENCOUNTER — Encounter (HOSPITAL_COMMUNITY): Payer: Self-pay

## 2016-10-15 VITALS — BP 170/80 | HR 93 | Temp 98.5°F | Resp 20 | Ht 72.0 in | Wt 300.6 lb

## 2016-10-15 DIAGNOSIS — Z9884 Bariatric surgery status: Secondary | ICD-10-CM

## 2016-10-15 DIAGNOSIS — C7951 Secondary malignant neoplasm of bone: Secondary | ICD-10-CM

## 2016-10-15 DIAGNOSIS — C7931 Secondary malignant neoplasm of brain: Secondary | ICD-10-CM | POA: Diagnosis present

## 2016-10-15 DIAGNOSIS — K5900 Constipation, unspecified: Secondary | ICD-10-CM | POA: Diagnosis present

## 2016-10-15 DIAGNOSIS — Z86718 Personal history of other venous thrombosis and embolism: Secondary | ICD-10-CM

## 2016-10-15 DIAGNOSIS — E119 Type 2 diabetes mellitus without complications: Secondary | ICD-10-CM | POA: Diagnosis present

## 2016-10-15 DIAGNOSIS — C787 Secondary malignant neoplasm of liver and intrahepatic bile duct: Secondary | ICD-10-CM | POA: Diagnosis present

## 2016-10-15 DIAGNOSIS — J910 Malignant pleural effusion: Secondary | ICD-10-CM | POA: Diagnosis present

## 2016-10-15 DIAGNOSIS — K219 Gastro-esophageal reflux disease without esophagitis: Secondary | ICD-10-CM | POA: Diagnosis present

## 2016-10-15 DIAGNOSIS — Z803 Family history of malignant neoplasm of breast: Secondary | ICD-10-CM

## 2016-10-15 DIAGNOSIS — Z515 Encounter for palliative care: Secondary | ICD-10-CM | POA: Insufficient documentation

## 2016-10-15 DIAGNOSIS — F329 Major depressive disorder, single episode, unspecified: Secondary | ICD-10-CM

## 2016-10-15 DIAGNOSIS — Z86711 Personal history of pulmonary embolism: Secondary | ICD-10-CM

## 2016-10-15 DIAGNOSIS — R569 Unspecified convulsions: Secondary | ICD-10-CM | POA: Diagnosis present

## 2016-10-15 DIAGNOSIS — Z6841 Body Mass Index (BMI) 40.0 and over, adult: Secondary | ICD-10-CM

## 2016-10-15 DIAGNOSIS — R634 Abnormal weight loss: Secondary | ICD-10-CM | POA: Diagnosis present

## 2016-10-15 DIAGNOSIS — C779 Secondary and unspecified malignant neoplasm of lymph node, unspecified: Secondary | ICD-10-CM | POA: Diagnosis present

## 2016-10-15 DIAGNOSIS — J90 Pleural effusion, not elsewhere classified: Secondary | ICD-10-CM

## 2016-10-15 DIAGNOSIS — I110 Hypertensive heart disease with heart failure: Secondary | ICD-10-CM | POA: Diagnosis present

## 2016-10-15 DIAGNOSIS — D630 Anemia in neoplastic disease: Secondary | ICD-10-CM | POA: Diagnosis present

## 2016-10-15 DIAGNOSIS — J9601 Acute respiratory failure with hypoxia: Secondary | ICD-10-CM | POA: Diagnosis present

## 2016-10-15 DIAGNOSIS — J9621 Acute and chronic respiratory failure with hypoxia: Secondary | ICD-10-CM | POA: Diagnosis not present

## 2016-10-15 DIAGNOSIS — Z7984 Long term (current) use of oral hypoglycemic drugs: Secondary | ICD-10-CM

## 2016-10-15 DIAGNOSIS — R0902 Hypoxemia: Secondary | ICD-10-CM

## 2016-10-15 DIAGNOSIS — Z79899 Other long term (current) drug therapy: Secondary | ICD-10-CM

## 2016-10-15 DIAGNOSIS — Z510 Encounter for antineoplastic radiation therapy: Secondary | ICD-10-CM | POA: Diagnosis present

## 2016-10-15 DIAGNOSIS — Z8249 Family history of ischemic heart disease and other diseases of the circulatory system: Secondary | ICD-10-CM

## 2016-10-15 DIAGNOSIS — I5032 Chronic diastolic (congestive) heart failure: Secondary | ICD-10-CM | POA: Diagnosis present

## 2016-10-15 DIAGNOSIS — I10 Essential (primary) hypertension: Secondary | ICD-10-CM | POA: Diagnosis present

## 2016-10-15 DIAGNOSIS — E669 Obesity, unspecified: Secondary | ICD-10-CM | POA: Diagnosis present

## 2016-10-15 DIAGNOSIS — R0602 Shortness of breath: Secondary | ICD-10-CM | POA: Diagnosis present

## 2016-10-15 DIAGNOSIS — G9340 Encephalopathy, unspecified: Secondary | ICD-10-CM | POA: Diagnosis present

## 2016-10-15 DIAGNOSIS — Z8042 Family history of malignant neoplasm of prostate: Secondary | ICD-10-CM

## 2016-10-15 DIAGNOSIS — Z66 Do not resuscitate: Secondary | ICD-10-CM | POA: Diagnosis present

## 2016-10-15 DIAGNOSIS — J918 Pleural effusion in other conditions classified elsewhere: Secondary | ICD-10-CM | POA: Diagnosis not present

## 2016-10-15 DIAGNOSIS — Z800 Family history of malignant neoplasm of digestive organs: Secondary | ICD-10-CM

## 2016-10-15 DIAGNOSIS — C3490 Malignant neoplasm of unspecified part of unspecified bronchus or lung: Secondary | ICD-10-CM | POA: Diagnosis present

## 2016-10-15 DIAGNOSIS — E1169 Type 2 diabetes mellitus with other specified complication: Secondary | ICD-10-CM | POA: Diagnosis present

## 2016-10-15 DIAGNOSIS — R630 Anorexia: Secondary | ICD-10-CM | POA: Diagnosis present

## 2016-10-15 DIAGNOSIS — Z801 Family history of malignant neoplasm of trachea, bronchus and lung: Secondary | ICD-10-CM

## 2016-10-15 DIAGNOSIS — C3492 Malignant neoplasm of unspecified part of left bronchus or lung: Secondary | ICD-10-CM

## 2016-10-15 DIAGNOSIS — Z8674 Personal history of sudden cardiac arrest: Secondary | ICD-10-CM

## 2016-10-15 DIAGNOSIS — R011 Cardiac murmur, unspecified: Secondary | ICD-10-CM | POA: Diagnosis present

## 2016-10-15 DIAGNOSIS — Z808 Family history of malignant neoplasm of other organs or systems: Secondary | ICD-10-CM

## 2016-10-15 DIAGNOSIS — Z9889 Other specified postprocedural states: Secondary | ICD-10-CM

## 2016-10-15 DIAGNOSIS — R0600 Dyspnea, unspecified: Secondary | ICD-10-CM | POA: Diagnosis not present

## 2016-10-15 DIAGNOSIS — J9691 Respiratory failure, unspecified with hypoxia: Secondary | ICD-10-CM | POA: Diagnosis present

## 2016-10-15 LAB — COMPREHENSIVE METABOLIC PANEL
ALT: 8 U/L (ref 0–55)
ANION GAP: 13 meq/L — AB (ref 3–11)
AST: 12 U/L (ref 5–34)
Albumin: 3.2 g/dL — ABNORMAL LOW (ref 3.5–5.0)
Alkaline Phosphatase: 132 U/L (ref 40–150)
BUN: 14.3 mg/dL (ref 7.0–26.0)
CALCIUM: 9.1 mg/dL (ref 8.4–10.4)
CHLORIDE: 99 meq/L (ref 98–109)
CO2: 25 meq/L (ref 22–29)
Creatinine: 0.9 mg/dL (ref 0.7–1.3)
EGFR: >90 mL/min/{1.73_m2} (ref >90)
Glucose: 164 mg/dl — ABNORMAL HIGH (ref 70–140)
POTASSIUM: 4.2 meq/L (ref 3.5–5.1)
Sodium: 137 mEq/L (ref 136–145)
Total Bilirubin: 0.39 mg/dL (ref 0.20–1.20)
Total Protein: 6.8 g/dL (ref 6.4–8.3)

## 2016-10-15 LAB — CBC & DIFF AND RETIC
BASO%: 0.6 % (ref 0.0–2.0)
BASOS ABS: 0.1 10*3/uL (ref 0.0–0.1)
EOS%: 10.9 % — AB (ref 0.0–7.0)
Eosinophils Absolute: 1.2 10*3/uL — ABNORMAL HIGH (ref 0.0–0.5)
HEMATOCRIT: 45.5 % (ref 38.4–49.9)
HGB: 15 g/dL (ref 13.0–17.1)
Immature Retic Fract: 15.2 % — ABNORMAL HIGH (ref 3.00–10.60)
LYMPH%: 7.7 % — ABNORMAL LOW (ref 14.0–49.0)
MCH: 30.9 pg (ref 27.2–33.4)
MCHC: 33 g/dL (ref 32.0–36.0)
MCV: 93.8 fL (ref 79.3–98.0)
MONO#: 1.1 10*3/uL — ABNORMAL HIGH (ref 0.1–0.9)
MONO%: 9.7 % (ref 0.0–14.0)
NEUT#: 8 10*3/uL — ABNORMAL HIGH (ref 1.5–6.5)
NEUT%: 71.1 % (ref 39.0–75.0)
Platelets: 207 10*3/uL (ref 140–400)
RBC: 4.85 10*6/uL (ref 4.20–5.82)
RDW: 15.9 % — AB (ref 11.0–14.6)
RETIC %: 1.89 % — AB (ref 0.80–1.80)
Retic Ct Abs: 91.67 10*3/uL (ref 34.80–93.90)
WBC: 11.3 10*3/uL — AB (ref 4.0–10.3)
lymph#: 0.9 10*3/uL (ref 0.9–3.3)

## 2016-10-15 LAB — PROTIME-INR
INR: 0.94
PROTHROMBIN TIME: 12.6 s (ref 11.4–15.2)

## 2016-10-15 LAB — GLUCOSE, CAPILLARY
GLUCOSE-CAPILLARY: 150 mg/dL — AB (ref 65–99)
GLUCOSE-CAPILLARY: 192 mg/dL — AB (ref 65–99)
Glucose-Capillary: 221 mg/dL — ABNORMAL HIGH (ref 65–99)

## 2016-10-15 MED ORDER — FUROSEMIDE 10 MG/ML IJ SOLN
40.0000 mg | Freq: Once | INTRAMUSCULAR | Status: AC
  Administered 2016-10-15: 40 mg via INTRAVENOUS
  Filled 2016-10-15: qty 4

## 2016-10-15 MED ORDER — ONDANSETRON HCL 4 MG/2ML IJ SOLN: 4.0000 mg | Freq: Four times a day (QID) | INTRAMUSCULAR | Status: DC | PRN

## 2016-10-15 MED ORDER — INSULIN ASPART 100 UNIT/ML ~~LOC~~ SOLN
0.0000 [IU] | Freq: Three times a day (TID) | SUBCUTANEOUS | Status: DC
  Administered 2016-10-15: 3 [IU] via SUBCUTANEOUS

## 2016-10-15 MED ORDER — ACETAMINOPHEN 650 MG RE SUPP: 650.0000 mg | Freq: Four times a day (QID) | RECTAL | Status: DC | PRN

## 2016-10-15 MED ORDER — CARBAMAZEPINE 200 MG PO TABS
100.0000 mg | ORAL_TABLET | Freq: Two times a day (BID) | ORAL | Status: DC
  Filled 2016-10-15: qty 1
  Filled 2016-10-15: qty 0.5

## 2016-10-15 MED ORDER — SODIUM CHLORIDE 0.9 % IV SOLN: 250.0000 mL | INTRAVENOUS | Status: DC | PRN

## 2016-10-15 MED ORDER — MIRTAZAPINE 15 MG PO TABS
45.0000 mg | ORAL_TABLET | Freq: Every day | ORAL | Status: DC
  Administered 2016-10-15: 45 mg via ORAL
  Filled 2016-10-15 (×2): qty 3

## 2016-10-15 MED ORDER — ALPRAZOLAM 0.5 MG PO TABS
0.5000 mg | ORAL_TABLET | Freq: Three times a day (TID) | ORAL | Status: DC | PRN
  Administered 2016-10-15 – 2016-10-16 (×2): 0.5 mg via ORAL
  Filled 2016-10-15 (×2): qty 1

## 2016-10-15 MED ORDER — LEVETIRACETAM 500 MG PO TABS
1500.0000 mg | ORAL_TABLET | Freq: Two times a day (BID) | ORAL | Status: DC
  Administered 2016-10-15 – 2016-10-16 (×3): 1500 mg via ORAL
  Filled 2016-10-15 (×3): qty 3

## 2016-10-15 MED ORDER — ADULT MULTIVITAMIN W/MINERALS CH
1.0000 | ORAL_TABLET | Freq: Every day | ORAL | Status: DC
  Administered 2016-10-15: 1 via ORAL
  Filled 2016-10-15 (×2): qty 1

## 2016-10-15 MED ORDER — GLUCERNA SHAKE PO LIQD
237.0000 mL | Freq: Three times a day (TID) | ORAL | Status: DC
  Filled 2016-10-15 (×5): qty 237

## 2016-10-15 MED ORDER — ALECTINIB HCL 150 MG PO CAPS
600.0000 mg | ORAL_CAPSULE | Freq: Two times a day (BID) | ORAL | Status: DC
  Administered 2016-10-15 – 2016-10-16 (×3): 600 mg via ORAL

## 2016-10-15 MED ORDER — TRAZODONE HCL 50 MG PO TABS
50.0000 mg | ORAL_TABLET | Freq: Every day | ORAL | Status: DC
  Administered 2016-10-15: 50 mg via ORAL
  Filled 2016-10-15: qty 1

## 2016-10-15 MED ORDER — DULOXETINE HCL 60 MG PO CPEP
90.0000 mg | ORAL_CAPSULE | Freq: Every day | ORAL | Status: DC
  Administered 2016-10-15 – 2016-10-16 (×2): 90 mg via ORAL
  Filled 2016-10-15 (×2): qty 1

## 2016-10-15 MED ORDER — BISACODYL 10 MG RE SUPP: 10.0000 mg | Freq: Every day | RECTAL | Status: DC | PRN

## 2016-10-15 MED ORDER — BISACODYL 10 MG RE SUPP
10.0000 mg | Freq: Once | RECTAL | Status: DC
  Filled 2016-10-15: qty 1

## 2016-10-15 MED ORDER — PRO-STAT SUGAR FREE PO LIQD
30.0000 mL | Freq: Two times a day (BID) | ORAL | Status: DC
  Administered 2016-10-15: 30 mL via ORAL
  Filled 2016-10-15: qty 30

## 2016-10-15 MED ORDER — ONDANSETRON HCL 4 MG PO TABS: 4.0000 mg | ORAL_TABLET | Freq: Four times a day (QID) | ORAL | Status: DC | PRN

## 2016-10-15 MED ORDER — CARBAMAZEPINE 200 MG PO TABS
200.0000 mg | ORAL_TABLET | Freq: Two times a day (BID) | ORAL | Status: DC
  Administered 2016-10-15 – 2016-10-16 (×2): 200 mg via ORAL
  Filled 2016-10-15 (×2): qty 1

## 2016-10-15 MED ORDER — ALPRAZOLAM 0.25 MG PO TABS
0.2500 mg | ORAL_TABLET | Freq: Once | ORAL | Status: AC
  Administered 2016-10-15: 0.25 mg via ORAL
  Filled 2016-10-15: qty 1

## 2016-10-15 MED ORDER — POLYETHYLENE GLYCOL 3350 17 G PO PACK
17.0000 g | PACK | Freq: Once | ORAL | Status: DC
  Filled 2016-10-15: qty 1

## 2016-10-15 MED ORDER — MORPHINE SULFATE (PF) 2 MG/ML IV SOLN
2.0000 mg | INTRAVENOUS | Status: DC | PRN
  Administered 2016-10-15 – 2016-10-16 (×8): 2 mg via INTRAVENOUS
  Filled 2016-10-15 (×8): qty 1

## 2016-10-15 MED ORDER — PANTOPRAZOLE SODIUM 40 MG PO TBEC
40.0000 mg | DELAYED_RELEASE_TABLET | Freq: Every day | ORAL | Status: DC
  Administered 2016-10-15: 40 mg via ORAL
  Filled 2016-10-15 (×2): qty 1

## 2016-10-15 MED ORDER — SODIUM CHLORIDE 0.9% FLUSH: 3.0000 mL | INTRAVENOUS | Status: DC | PRN

## 2016-10-15 MED ORDER — INSULIN ASPART 100 UNIT/ML ~~LOC~~ SOLN: 0.0000 [IU] | Freq: Every day | SUBCUTANEOUS | Status: DC

## 2016-10-15 MED ORDER — PENTOXIFYLLINE ER 400 MG PO TBCR
400.0000 mg | EXTENDED_RELEASE_TABLET | Freq: Two times a day (BID) | ORAL | Status: DC
  Administered 2016-10-15 – 2016-10-16 (×2): 400 mg via ORAL
  Filled 2016-10-15 (×4): qty 1

## 2016-10-15 MED ORDER — ACETAMINOPHEN 325 MG PO TABS: 650.0000 mg | ORAL_TABLET | Freq: Four times a day (QID) | ORAL | Status: DC | PRN

## 2016-10-15 MED ORDER — ATORVASTATIN CALCIUM 40 MG PO TABS: 40.0000 mg | ORAL_TABLET | Freq: Every day | ORAL | Status: DC

## 2016-10-15 MED ORDER — SODIUM CHLORIDE 0.9% FLUSH
3.0000 mL | Freq: Two times a day (BID) | INTRAVENOUS | Status: DC
  Administered 2016-10-15 – 2016-10-16 (×2): 3 mL via INTRAVENOUS

## 2016-10-15 MED ORDER — POLYETHYLENE GLYCOL 3350 17 G PO PACK: 17.0000 g | PACK | Freq: Every day | ORAL | Status: DC | PRN

## 2016-10-15 NOTE — H&P (Addendum)
History and Physical    Walter Wilkins. OVF:[id number] DOB: 11-21-1956 DOA: 10/17/2016    PCP: Yong Channel, MD  Patient coming from: home  Chief Complaint: shortness of breath  HPI: Walter Wilkins. is a 60 y.o. 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 Found to have mod L pleural effusion and possible lymphangitic spread of tumor vs fluid overlaod on CT on 10/20. Pulse ox 74% on room air in office. He states his dyspnea is constant and worse on exertion. No cough or chest pain.    ED Course: Direct admit Anion gap 13 with HCO3 25 WBC 11.3  Review of Systems:  Anorexia, 18 lb wt loss in past 6 wks No BM in almost 1 wk, vomited once 4 days ago Numbness/ tingling of b/l hands Easy bruising Headaches thought to be from radiation Depression/ anxiety/ panic attacks/ insomnia All other systems reviewed and apart from HPI, are negative.  Past Medical History:  Diagnosis Date  . Acid reflux   . Brain mass 03/24/15   ct head - left frontal lobe mass consistent with a brain metastasis  . Cancer ([place])    mass on lung, brain, sinuses, liver and bones  . Diabetes mellitus without complication ([place])   . H/O gastric bypass    a. ~2011 at [facility].  . Seizures ([place]) 03/23/15    Past Surgical History:  Procedure Laterality Date  . APPENDECTOMY    . CHOLECYSTECTOMY    . GASTRIC BYPASS  2011  . LEFT HEART CATHETERIZATION WITH CORONARY ANGIOGRAM N/A 03/23/2015   Procedure: LEFT HEART CATHETERIZATION WITH CORONARY ANGIOGRAM;  Surgeon: Burnell Blanks, MD;  [facility] C[facility] LAB;  Service: Cardiovascular;  Laterality: N/A;  . LIVER BIOPSY  03/27/15  . right knee osroscopy Right     Social History:   reports that he has never smoked. He has never used smokeless tobacco. He reports that he drinks alcohol. He reports that he does not use drugs.  No Known Allergies  Family History  Problem Relation Age of Onset  . Valvular heart disease Father     H/o pig valve  . Cancer Father     skin cancer   . Breast cancer Mother   . Cancer Mother 4    breast cancer   . Cancer Brother 42    base of tongue   . Cancer Maternal Uncle     throat cancer   . Cancer Maternal Uncle 90    colon cancer, and prostate cancer   . Cancer Cousin     lung cancer   . Sudden death Neg Hx     No family history of sudden cardiac death     Prior to Admission medications   Medication Sig Start Date End Date Taking? Authorizing Provider  albuterol (PROVENTIL HFA) 108 (90 Base) MCG/ACT inhaler Inhale 2 puffs into the lungs every 6 (six) hours as needed for wheezing or shortness of breath. 10/02/16   [name], MD  alectinib (ALECENSA) 150 MG capsule Take 4 capsules (600 mg total) by mouth 2 (two) times daily. 09/03/16   [name], MD  ALPRAZolam Duanne Moron) 0.5 MG tablet TAKE ONE TABLET BY MOUTH THREE TIMES A DAY AS NEEDED. 09/18/16  [name], MD  atorvastatin (LIPITOR) 40 MG tablet Take 40 mg by mouth at bedtime. Last Refill 08/22/2014    Historical Provider, MD  blood glucose meter kit and supplies KIT Dispense based on patient and insurance preference. Use up to four times daily as directed. (FOR ICD-9 250.00, 250.01). 03/28/15   Orson Eva, MD  Brigatinib 30 MG TABS Take 180 mg by mouth daily. Patient not taking: Reported on 10/11/2016 09/18/16   [name], MD  Calcium Carbonate-Vitamin D (CALCIUM-VITAMIN D) 500-200 MG-UNIT tablet Take by mouth. 06/19/15 06/18/16  Historical Provider, MD  carbamazepine (TEGRETOL) 200 MG tablet 1/2 tablet twice a day for 2 weeks, then take 1 tablet twice a day 06/10/16   Kathrynn Ducking, MD  Cyanocobalamin (RA VITAMIN B-12 TR) 1000 MCG TBCR Take 1 tablet by mouth daily. 08/05/16   Historical  Provider, MD  DULoxetine (CYMBALTA) 30 MG capsule Take 30 mg by mouth daily. Total of 90 mg daily-increased 08/05/16    Historical Provider, MD  DULoxetine (CYMBALTA) 60 MG capsule Take 60 mg by mouth.    Historical Provider, MD  enoxaparin (LOVENOX) 100 MG/ML injection Inject 1 mL (100 mg total) into the skin daily. 04/11/16   [name], MD  glipiZIDE (GLUCOTROL) 5 MG tablet Take 5 mg by mouth daily before breakfast.    Historical Provider, MD  levETIRAcetam (KEPPRA) 750 MG tablet TAKE 2 TABLETS (1,500 MG TOTAL) BY MOUTH 2 (TWO) TIMES DAILY. 08/12/16   Kathrynn Ducking, MD  metFORMIN (GLUCOPHAGE) 1000 MG tablet  05/08/16   Historical Provider, MD  mirtazapine (REMERON) 45 MG tablet TAKE 1 TABLET (45 MG TOTAL) BY MOUTH AT BEDTIME. 06/19/16   [name], MD  Multiple Vitamin (MULTI-VITAMINS) TABS Take 1 tablet by mouth daily.    Historical Provider, MD  omeprazole (PRILOSEC) 20 MG capsule Take 20 mg by mouth daily. Last Refill 08/22/2014    Historical Provider, MD  ondansetron (ZOFRAN) 8 MG tablet Take 1 tablet (8 mg total) by mouth every 8 (eight) hours as needed for nausea or vomiting. 07/03/15   [name], MD  pentoxifylline (TRENTAL) 400 MG CR tablet TAKE ONE TABLET BY MOUTH DAILY FOR 1 WEEK. THEN TAKE ONE TABLET TWO TIMES A DAY. TAKE WITH FOOD. 08/06/16   Eppie Gibson, MD  traZODone (DESYREL) 50 MG tablet Take 50 mg by mouth at bedtime.    Historical Provider, MD  vitamin E 400 UNIT capsule TAKE ONE CAPSULE (400 UNITS) DAILY FOR 1 WEEK. THEN TAKE ONE CAPSULE TWO TIMES A DAY 08/06/16   Eppie Gibson, MD    Physical Exam: Vitals:   10/10/2016 1016  BP: (!) 162/88  Pulse: 89  Resp: 20  Temp: 98.4 F (36.9 C)  TempSrc: Oral  SpO2: 91%  Weight: 136.1 kg (300 lb)  Height: 6' (1.829 m)      Constitutional: NAD, calm, comfortable Eyes: PERTLA, lids and conjunctivae normal ENMT: Mucous membranes are moist. Posterior pharynx clear of any exudate or lesions. Normal dentition.  Neck: normal, supple, no  masses, no thyromegaly Respiratory: poor air entry LL lung field, clear to auscultation bilaterally, no wheezing, no crackles. Normal respiratory effort. No accessory muscle use. 93% on 2 L Cardiovascular: S1 & S2 heard, regular rate and rhythm, 2/6 murmur RUS border- no rubs / gallops. No extremity edema. 2+ pedal pulses. No carotid bruits.  Abdomen: No distension, no tenderness, no masses palpated. No hepatosplenomegaly. Bowel sounds normal.  Musculoskeletal: no clubbing / cyanosis. No joint deformity upper and  lower extremities. Good ROM, no contractures. Normal muscle tone.  Skin: no rashes, lesions, ulcers. No induration Neurologic: CN 2-12 grossly intact. Sensation intact, DTR normal. Strength 5/5 in all 4 limbs.  Psychiatric: Normal judgment and insight. Alert and oriented x 3. Normal mood.     Labs on Admission: I have personally reviewed following labs and imaging studies  CBC:  Recent Labs Lab 10/04/2016 0814  WBC 11.3*  NEUTROABS 8.0*  HGB 15.0  HCT 45.5  MCV 93.8  PLT 333   Basic Metabolic Panel:  Recent Labs Lab 10/20/2016 0814  NA 137  K 4.2  CO2 25  GLUCOSE 164*  BUN 14.3  CREATININE 0.9  CALCIUM 9.1   GFR: Estimated Creatinine Clearance: 123.1 mL/min (by C-G formula based on SCr of 0.9 mg/dL). Liver Function Tests:  Recent Labs Lab 09/30/2016 0814  AST 12  ALT 8  ALKPHOS 132  BILITOT 0.39  PROT 6.8  ALBUMIN 3.2*   No results for input(s): LIPASE, AMYLASE in the last 168 hours. No results for input(s): AMMONIA in the last 168 hours. Coagulation Profile: No results for input(s): INR, PROTIME in the last 168 hours. Cardiac Enzymes: No results for input(s): CKTOTAL, CKMB, CKMBINDEX, TROPONINI in the last 168 hours. BNP (last 3 results) No results for input(s): PROBNP in the last 8760 hours. HbA1C: No results for input(s): HGBA1C in the last 72 hours. CBG: No results for input(s): GLUCAP in the last 168 hours. Lipid Profile: No results for  input(s): CHOL, HDL, LDLCALC, TRIG, CHOLHDL, LDLDIRECT in the last 72 hours. Thyroid Function Tests: No results for input(s): TSH, T4TOTAL, FREET4, T3FREE, THYROIDAB in the last 72 hours. Anemia Panel:  Recent Labs  10/09/2016 0814  RETICCTPCT 1.89*   Urine analysis: No results found for: COLORURINE, APPEARANCEUR, LABSPEC, PHURINE, GLUCOSEU, HGBUR, BILIRUBINUR, KETONESUR, PROTEINUR, UROBILINOGEN, NITRITE, LEUKOCYTESUR Sepsis Labs: @LABRCNTIP (procalcitonin:4,lacticidven:4) )No results found for this or any previous visit (from the past 240 hour(s)).   Radiological Exams on Admission: No results found.     Assessment/Plan Principal Problem:  Acute Respiratory failure with hypoxia  - due to lung CA with possible lymphangitic spread and likely malignant pleural effusion - have asked pulmonary for thoracentesis- Lovenox held this AM  - no PE on recent CT - last ECHO in 2016 showed Gr 1 diastolic dysfunction- will obtain another  Active Problems:   Chronic diastolic heart failure grade 1 , murmur RUS border - ECHO ordered    Metastatic lung cancer (metastasis from lung to other site)  -cont Alecensa per Dr Burr Medico    Brain metastasis  - cont radiation- including today, he has 2 treatments left- will go for treatment at 2 today    Anemia in neoplastic disease - Hb 12.8 in 1/17 and now 15- ? 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

## 2016-10-15 NOTE — Consult Note (Signed)
Name: Walter Wilkins. MRN: [id number] DOB: 1956/05/13    ADMISSION DATE:  09/25/2016 CONSULTATION DATE:  10/24  REFERRING MD : Wynelle Cleveland   CHIEF COMPLAINT:  Hypoxia and pleural effusion   BRIEF PATIENT DESCRIPTION:  60 year old male w/  widely metastatic lung cancer w/ mets to brain, LNs, bone and liver. Currently on Alectinib and Xgeva every 4 weeks. He is also on whole brain radiation. Most recent CT scan showing progression of Left Consolidative lung mass, probable lymphangitic spread AND new left effusion. Presented to heme/onc on 10/23 hypoxic. PCCM asked to see on 10/24 for hypoxia and new left effusion.   SIGNIFICANT EVENTS    STUDIES:     HISTORY OF PRESENT ILLNESS:    This is a 60 year old male w/ widely metastatic lung cancer w/ mets to brain, LNs, bone and liver. Currently on Alectinib and Xgeva ever 4 weeks. He is also on whole brain radiation.  Was seen as an Urgent work-in at the heme/onc clinic on 10/24 w/ cc: ~ 1 week h/o increasing shortness of breath. His most recent CT chest on 10/10 showed: "progressive airspace consolidation of the LUL and LLL, new mod volume Left effusion and new interlobular septal thickening lower lobe predomominant raising concern for lymphangitic spread. Of note he has a h/o PE diagnosed back in June 2016, for which he is on LMWH daily. He denied fever, or productive cough. His room air sats were fount to be 74% so he was sent for admission and PCCM was asked to see re: new acute respiratory failure as well as to consider thoracentesis.     PAST MEDICAL HISTORY :   has a past medical history of Acid reflux; Brain mass (03/24/15); Cancer ([place]); Diabetes mellitus without complication ([place]); H/O gastric bypass; and Seizures ([place]) (03/23/15).  has a past surgical history that includes left heart catheterization with coronary angiogram (N/A, 03/23/2015); Gastric bypass (2011); Liver biopsy (03/27/15); Appendectomy; Cholecystectomy; and right knee osroscopy  (Right). Prior to Admission medications   Medication Sig Start Date End Date Taking? Authorizing Provider  albuterol (PROVENTIL HFA) 108 (90 Base) MCG/ACT inhaler Inhale 2 puffs into the lungs every 6 (six) hours as needed for wheezing or shortness of breath. 10/02/16   [name], MD  alectinib (ALECENSA) 150 MG capsule Take 4 capsules (600 mg total) by mouth 2 (two) times daily. 09/03/16   [name], MD  ALPRAZolam Duanne Moron) 0.5 MG tablet TAKE ONE TABLET BY MOUTH THREE TIMES A DAY AS NEEDED. 09/18/16   [name], MD  atorvastatin (LIPITOR) 40 MG tablet Take 40 mg by mouth at bedtime. Last Refill 08/22/2014    Historical Provider, MD  blood glucose meter kit and supplies KIT Dispense based on patient and insurance preference. Use up to four times daily as directed. (FOR ICD-9 250.00, 250.01). 03/28/15   Orson Eva, MD  Brigatinib 30 MG TABS Take 180 mg by mouth daily. Patient not taking: Reported on 09/29/2016 09/18/16   [name], MD  Calcium Carbonate-Vitamin D (CALCIUM-VITAMIN D) 500-200 MG-UNIT tablet Take by mouth. 06/19/15 06/18/16  Historical Provider, MD  carbamazepine (TEGRETOL) 200 MG tablet 1/2 tablet twice a day for 2 weeks, then take 1 tablet twice a day 06/10/16   Kathrynn Ducking, MD  Cyanocobalamin (RA VITAMIN B-12 TR) 1000 MCG TBCR Take 1 tablet by mouth daily. 08/05/16   Historical Provider, MD  DULoxetine (CYMBALTA) 30 MG capsule Take 30 mg by mouth daily. Total of 90 mg daily-increased  08/05/16    Historical Provider, MD  DULoxetine (CYMBALTA) 60 MG capsule Take 60 mg by mouth.    Historical Provider, MD  enoxaparin (LOVENOX) 100 MG/ML injection Inject 1 mL (100 mg total) into the skin daily. 04/11/16   [name], MD  glipiZIDE (GLUCOTROL) 5 MG tablet Take 5 mg by mouth daily before breakfast.    Historical Provider, MD  levETIRAcetam (KEPPRA) 750 MG tablet TAKE 2 TABLETS (1,500 MG TOTAL) BY MOUTH 2 (TWO) TIMES DAILY. 08/12/16   Kathrynn Ducking, MD  metFORMIN (GLUCOPHAGE) 1000 MG tablet  05/08/16    Historical Provider, MD  mirtazapine (REMERON) 45 MG tablet TAKE 1 TABLET (45 MG TOTAL) BY MOUTH AT BEDTIME. 06/19/16   [name], MD  Multiple Vitamin (MULTI-VITAMINS) TABS Take 1 tablet by mouth daily.    Historical Provider, MD  omeprazole (PRILOSEC) 20 MG capsule Take 20 mg by mouth daily. Last Refill 08/22/2014    Historical Provider, MD  ondansetron (ZOFRAN) 8 MG tablet Take 1 tablet (8 mg total) by mouth every 8 (eight) hours as needed for nausea or vomiting. 07/03/15   [name], MD  pentoxifylline (TRENTAL) 400 MG CR tablet TAKE ONE TABLET BY MOUTH DAILY FOR 1 WEEK. THEN TAKE ONE TABLET TWO TIMES A DAY. TAKE WITH FOOD. 08/06/16   Eppie Gibson, MD  traZODone (DESYREL) 50 MG tablet Take 50 mg by mouth at bedtime.    Historical Provider, MD  vitamin E 400 UNIT capsule TAKE ONE CAPSULE (400 UNITS) DAILY FOR 1 WEEK. THEN TAKE ONE CAPSULE TWO TIMES A DAY 08/06/16   Eppie Gibson, MD   No Known Allergies  FAMILY HISTORY:  family history includes Breast cancer in his mother; Cancer in his cousin, father, and maternal uncle; Cancer (age of onset: 66) in his brother; Cancer (age of onset: 31) in his mother; Cancer (age of onset: 50) in his maternal uncle; Valvular heart disease in his father. SOCIAL HISTORY:  reports that he has never smoked. He has never used smokeless tobacco. He reports that he drinks alcohol. He reports that he does not use drugs.  REVIEW OF SYSTEMS:    Constitutional: Positive for malaise/fatigue. Negative for chills, fever and weight loss.       Has gotten progressive weakness since starting whole brain XRT  HENT: Negative.   Eyes: Negative.   Respiratory: Positive for shortness of breath. Negative for cough, hemoptysis, sputum production and wheezing.   Cardiovascular: Negative.   Gastrointestinal: Negative.   Genitourinary: Negative.   Musculoskeletal: Negative.   Skin: Negative for itching and rash.  Neurological: Positive for weakness.  Endo/Heme/Allergies: Negative.     Psychiatric/Behavioral: Positive for depression.    SUBJECTIVE:   VITAL SIGNS: Temp:  [98.4 F (36.9 C)-98.7 F (37.1 C)] 98.4 F (36.9 C) (10/24 1016) Pulse Rate:  [87-93] 89 (10/24 1016) Resp:  [20] 20 (10/24 1016) BP: (125-170)/(70-88) 162/88 (10/24 1016) SpO2:  [74 %-95 %] 91 % (10/24 1016) Weight:  [300 lb (136.1 kg)-303 lb (137.4 kg)] 300 lb (136.1 kg) (10/24 1016)  PHYSICAL EXAMINATION: General:  60 year old obese white male, resting in bed. + accessory use. Affect flat. Teary at times.  Neuro:  Awake, alert, no focal def.  HEENT:  NCAT, no JVD Cardiovascular:  RRR w/out MRG Lungs:  Crackles posterior. + accessory use, decreased breath sounds on left Abdomen:  Soft, not tender + bowel sounds Musculoskeletal:  Equal st and bulk  Skin:  Warm and dry    Recent Labs  Lab 10/03/2016 0814  NA 137  K 4.2  CO2 25  BUN 14.3  CREATININE 0.9  GLUCOSE 164*    Recent Labs Lab 10/14/2016 0814  HGB 15.0  HCT 45.5  WBC 11.3*  PLT 207   No results found.  ASSESSMENT / PLAN:  Metastatic Lung Cancer. Adenocarcinoma w/ mets to brain, LNs, liver and bone. ALK-EML4 translocation +.  Left Pleural effusion Possible Lymphangitic spread  Acute Hypoxic Respiratory Failure  Remote h/o PE-->2016 (on LMWH) Anemia  Depression  H/o cardiac arrest   Discussion  This is a 60 year old male who presented w/ acute hypoxic respiratory failure. He does have a moderate sized left effusion which may be malignant or para malignant in nature but it is doubtful that the effusion explains his degree of respiratory failure or work of breathing. Suspect that progression of his cancer and lymphangitic spread the more likely contributing factor. Had an open discussion with the patient and his wife Quince Santana. They are aware that this may represent the progression of his cancer. They do not want to prolong suffering if it looks like his abrupt worsening is from his cancer. They are also prepared to  transition to hospice should it appear we are at this point. I have recommended that she go ahead and contact their son in [place]. There may be little to offer here.   Plan CXR now Hold XRT today as I do not think he can safely transfer Will try 1 time dose IV lasix  Will look at his left chest in AM w/ [place]. We can consider bedside thoracentesis at that point (need to hold as he got full strength LMWH last evening) He is being followed by Palliative. Might be reasonable to start getting hospice involved.   Erick Colace ACNP-BC [place] Pager # [phone] OR # [phone] if no answer   10/08/2016, 11:27 AM

## 2016-10-15 NOTE — Progress Notes (Signed)
Oral Chemotherapy Pharmacist Encounter  Received notification from [employer] about 2nd level of appeal denial for medication [place]ig. Met patient and wife in [facility] lobby yesterday (10/23) to sign [place] patient access application. Application faxed yesterday to [employer], received confirmation fax from [employer] that application had been received. Today (10/24) I resent entire [employer] application as well as copies of PA request denial, 1st level appeal denial x2, and 2nd appeal denial. Received call from [place] about benefits investigation since [employer] had forwarded patient's script to them. I updated [employer] about all of the denials as medication is not covered by patient's insurance plan.  Oral Chemo Clinic will continue to follow.   Johny Drilling, PharmD, BCPS 10/14/2016  3:22 PM Oral Chemotherapy Clinic [phone]

## 2016-10-15 NOTE — Progress Notes (Signed)
Walter Wilkins  [phone] [phone]  Clinic Follow Up Note   Patient Care Team: [name]. [name], MD as PCP - General (Internal Medicine) [name], MD as Consulting Physician (Hematology) 10/07/2016   CHIEF COMPLAINTS:  Follow up metastatic lung cancer  Oncology History   Metastatic lung cancer (metastasis from lung to other site)   Staging form: Lung, AJCC 7th Edition     Clinical: Stage IV (T3, N3, M1b) - Unsigned        Metastatic lung cancer (metastasis from lung to other site) ([place])   03/24/2015 Imaging    Walter Wilkins 1.9 cm high left frontal lobe mass with minimal surrounding edema      03/24/2015 Imaging    CT showed Multiple low-density lesions in the liver worrisome for metastatic disease. Consolidative infiltrate in the left lower lobe with interstitial infiltrate in the left upper lobe. Single enlarged left hilar lymph node.      03/27/2015 Initial Diagnosis    Metastatic lung cancer (metastasis from lung to other site)      03/27/2015 Pathology Results    Metastatic adenocarcinoma, IHC positive for CK 7, TTF-1 and Napsin A. Negative for CK 20, CD X2, PSA, consistent with lung primary      03/27/2015 Miscellaneous    Foundation One test showed (+) EML4-ALK fusion (variant 2), CDKN2A/B loss, SMAD4 R361H.       04/07/2015 Imaging    PET scan showed hypermetabolic soft tissue infiltrates in the left lower lobe lung, bilateral mediastinal adenopathy,widespread metastatic disease, including to thoracic, low cervical nodes, liver and bones.      04/10/2015 - 04/10/2015 Radiation Therapy    SRS to the brain met       04/27/2015 - 09/08/2015 Chemotherapy    Crizotinib 275m bid, stopped due to disease preogression       08/08/2015 - 08/12/2015 [facility]     [facility] brain showed small hemorrhage. Lovenox was held,  and he had a IVC filter Placed. Lovenox was resumed one  month later.       09/09/2015 -  Chemotherapy    Alectinib [id number]       03/04/2016 Imaging    Decreased in size of the region of abnormal enhancement in the left posterior frontal cortical region since the study of January, marked reduction in regional vasogenic edema. 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 no new lesions      03/21/2016 Imaging    PET scan showed complete metabolic response, mild residual nodular scarring in the left lower lobe, residual hepatic lesion measuring up to 1.5 cm, multifocal sclerotic osseous lesions, without hypermetabolic of the above lesions.      09/16/2016 Progression    PET scan showed new hypermetabolic left upper and lower lobe pulmonary airspace disease, mild hypermetabolic right hilar adenopathy, stable sclerotic bone metastasis, without hypermetabolic activity.      09/24/2016 Imaging    Brain Wilkins with and without contrast showed at least a 21 you subcentimeter brain metastasis, no edema.      09/30/2016 -  Radiation Therapy         10/11/2016 Imaging    CT chest with contrast showed progressive air space consolidation within the left upper lobe and low lobe, new moderate volume left pleural effusion.        HISTORY OF PRESENTING ILLNESS (03/29/2015):  Walter [id number].o. male is here  because of newly diagnosed metastatic lung cancer.  He has been in good health until 3 month ago, when he started having intermittent nausea, and gradual weight loss a total of 18 lbs since then, although some are intentional by cutting back diet. He denies any vomiting, abdominal pain or discomfort, or change of his bowel habit.  He presented with syncope, right after he noticed some titch of left face, and rolling over of his eyes. He will Walter Wilkins on the ground and lost consciousness. This was witnessed by his wife and other people, 911 was called and paramedic staff underwent CPR for a total of 30 minutes. The cardiac monitor shows that he was  in asystole. He was brought to [facility] and was admitted. He workup in the ambulance, and did not have any significant in neurology deficit afterwards. He was seen by cardiology in the neurology service, cardiac catheterization was negative for coronary artery disease. He underwent a CT of head which showed a 1.9 cm lesion in the left frontal lobe with surrounding edema. CT of the chest reviewed no PE, but significant infiltrative consolidation in the left lower lobe, it was felt to be aspiration pneumonia. CT of the abdomen reviewed multiple diffuse liver metastasis. He underwent a liver biopsy which showed adenocarcinoma.   He was a discharge to home. He has been doing well overall since hospital discharge. He has some residual pain from the rib fracture from CPR. He denies any cough, dyspnea, or other symptoms. He has mild fatigue and low appetite, which has been chronic since his gastric bypass surgery in 2011.   CURRENT THERAPY:  1.Alectinib 600 mg twice daily started on 09/11/2015 2. Xgeva every 4 weeks, changed to ever 3 month in 05/2016  INTERIM HISTORY: Walter Wilkins returns for an urgent visit. He has been slightly more short of breath lately, I did call in albuterol last week. His dyspnea, much worse since yesterday afternoon after primary radiation, no chest pain, no fever or sputum production. He did Lovenox injection last night, but did not have this morning. He came he with his wife, and was found his oxygen was 74% on room air, improved to 92-93% on 2 L oxygen.  MEDICAL HISTORY:  Past Medical History:  Diagnosis Date  . Acid reflux   . Brain mass 03/24/15   ct head - left frontal lobe mass consistent with a brain metastasis  . Cancer ([place])    mass on lung, brain, sinuses, liver and bones  . Diabetes mellitus without complication ([place])   . H/O gastric bypass    a. ~2011 at [facility].  . Seizures ([place]) 03/23/15    SURGICAL HISTORY: Past Surgical History:  Procedure  Laterality Date  . APPENDECTOMY    . CHOLECYSTECTOMY    . GASTRIC BYPASS  2011  . LEFT HEART CATHETERIZATION WITH CORONARY ANGIOGRAM N/A 03/23/2015   Procedure: LEFT HEART CATHETERIZATION WITH CORONARY ANGIOGRAM;  Surgeon: Burnell Blanks, MD;  Location: [facility] CATH LAB;  Service: Cardiovascular;  Laterality: N/A;  . LIVER BIOPSY  03/27/15  . right knee osroscopy Right     SOCIAL HISTORY: History   Social History  . Marital Status: Unknown    Spouse Name: N/A  . Number of Children: 2  . Years of Education: N/A   Occupational History  . Barrister's clerk    Social History Main Topics  . Smoking status: Uses cigar   . Smokeless tobacco: Not on file  . Alcohol Use: 0.0 oz/week  0 Standard drinks or equivalent per week     Comment: Occasionally socially - once a week or less  . Drug Use: No  . Sexual Activity: Not on file   Other Topics Concern  . Not on file   Social History Narrative    FAMILY HISTORY: Family History  Problem Relation Age of Onset  . Valvular heart disease Father     H/o pig valve  . Cancer Father     skin cancer   . Breast cancer Mother   . Cancer Mother 64    breast cancer   . Cancer Brother 42    base of tongue   . Cancer Maternal Uncle     throat cancer   . Cancer Maternal Uncle 90    colon cancer, and prostate cancer   . Cancer Cousin     lung cancer   . Sudden death Neg Hx     No family history of sudden cardiac death    ALLERGIES:  has No Known Allergies.  MEDICATIONS:  Current Outpatient Prescriptions  Medication Sig Dispense Refill  . albuterol (PROVENTIL HFA) 108 (90 Base) MCG/ACT inhaler Inhale 2 puffs into the lungs every 6 (six) hours as needed for wheezing or shortness of breath. 18 g 1  . ALPRAZolam (XANAX) 0.5 MG tablet TAKE ONE TABLET BY MOUTH THREE TIMES A DAY AS NEEDED. 90 tablet 0  . atorvastatin (LIPITOR) 40 MG tablet Take 40 mg by mouth at bedtime. Last Refill 08/22/2014    . blood glucose meter kit and supplies KIT  Dispense based on patient and insurance preference. Use up to four times daily as directed. (FOR ICD-9 250.00, 250.01). 1 each 0  . carbamazepine (TEGRETOL) 200 MG tablet 1/2 tablet twice a day for 2 weeks, then take 1 tablet twice a day 60 tablet 3  . Cyanocobalamin (RA VITAMIN B-12 TR) 1000 MCG TBCR Take 1 tablet by mouth daily.    . DULoxetine (CYMBALTA) 30 MG capsule Take 30 mg by mouth daily. Total of 90 mg daily-increased 08/05/16    . DULoxetine (CYMBALTA) 60 MG capsule Take 60 mg by mouth.    . enoxaparin (LOVENOX) 100 MG/ML injection Inject 1 mL (100 mg total) into the skin daily. 30 Syringe 3  . glipiZIDE (GLUCOTROL) 5 MG tablet Take 5 mg by mouth daily before breakfast.    . levETIRAcetam (KEPPRA) 750 MG tablet TAKE 2 TABLETS (1,500 MG TOTAL) BY MOUTH 2 (TWO) TIMES DAILY. 120 tablet 5  . metFORMIN (GLUCOPHAGE) 1000 MG tablet     . mirtazapine (REMERON) 45 MG tablet TAKE 1 TABLET (45 MG TOTAL) BY MOUTH AT BEDTIME. 30 tablet 2  . Multiple Vitamin (MULTI-VITAMINS) TABS Take 1 tablet by mouth daily.    Marland Kitchen omeprazole (PRILOSEC) 20 MG capsule Take 20 mg by mouth daily. Last Refill 08/22/2014    . ondansetron (ZOFRAN) 8 MG tablet Take 1 tablet (8 mg total) by mouth every 8 (eight) hours as needed for nausea or vomiting. 30 tablet 3  . pentoxifylline (TRENTAL) 400 MG CR tablet TAKE ONE TABLET BY MOUTH DAILY FOR 1 WEEK. THEN TAKE ONE TABLET TWO TIMES A DAY. TAKE WITH FOOD. 60 tablet 5  . traZODone (DESYREL) 50 MG tablet Take 50 mg by mouth at bedtime.    . vitamin E 400 UNIT capsule TAKE ONE CAPSULE (400 UNITS) DAILY FOR 1 WEEK. THEN TAKE ONE CAPSULE TWO TIMES A DAY 60 capsule 5  . alectinib (ALECENSA) 150 MG capsule Take  4 capsules (600 mg total) by mouth 2 (two) times daily. 240 capsule 5  . Brigatinib 30 MG TABS Take 180 mg by mouth daily. (Patient not taking: Reported on 09/30/2016) 180 tablet 1  . Calcium Carbonate-Vitamin D (CALCIUM-VITAMIN D) 500-200 MG-UNIT tablet Take by mouth.     No  current facility-administered medications for this visit.     REVIEW OF SYSTEMS:   Constitutional: Denies fevers, chills or abnormal night sweats, (+) fatigue stable  Eyes: Denies blurriness of vision, double vision or watery eyes Ears, nose, mouth, throat, and face: Denies mucositis or sore throat Respiratory: Denies cough, dyspnea or wheezes Cardiovascular: Denies palpitation, chest discomfort or lower extremity swelling Gastrointestinal:  Mild intermittent nausea, no heartburn or change in bowel habits Skin: Denies abnormal skin rashes Lymphatics: Denies new lymphadenopathy or easy bruising Neurological:Denies numbness, tingling or new weaknesses Behavioral/Psych: Mood is stable, no new changes  All other systems were reviewed with the patient and are negative.  PHYSICAL EXAMINATION: ECOG PERFORMANCE STATUS: 1  Vitals:   09/23/2016 0830  BP: (!) 170/80  Pulse: 93  Resp: 20  Temp: 98.5 F (36.9 C)   Filed Weights   10/11/2016 0830  Weight: (!) 300 lb 9.6 oz (136.4 kg)    GENERAL:alert, no distress and comfortable SKIN: skin color, texture, turgor are normal, no rashes or significant lesions EYES: normal, conjunctiva are pink and non-injected, sclera clear OROPHARYNX:no exudate, no erythema and lips, buccal mucosa, and tongue normal  NECK: supple, thyroid normal size, non-tender, without nodularity LYMPH:  no palpable lymphadenopathy in the cervical, axillary or inguinal LUNGS: Decreased breath sounds in the left lung up to mid chest HEART: regular rate & rhythm and no murmurs and no lower extremity edema ABDOMEN:abdomen soft, non-tender and normal bowel sounds Musculoskeletal:no cyanosis of digits and no clubbing  PSYCH: alert & oriented x 3 with fluent speech NEURO: no focal motor/sensory deficits  LABORATORY DATA:  I have reviewed the data as listed CBC Latest Ref Rng & Units 09/25/2016 09/16/2016 08/05/2016  WBC 4.0 - 10.3 10e3/uL 11.3(H) 9.2 8.5  Hemoglobin 13.0 -  17.1 g/dL 15.0 12.8(L) 10.9(L)  Hematocrit 38.4 - 49.9 % 45.5 38.9 34.5(L)  Platelets 140 - 400 10e3/uL 207 196 228    CMP Latest Ref Rng & Units 09/16/2016 08/05/2016 08/05/2016  Glucose 70 - 140 mg/dl 186(H) 242(H) 291(H)  BUN 7.0 - 26.0 mg/dL 14.0 21 22.4  Creatinine 0.7 - 1.3 mg/dL 1.1 0.92 1.2  Sodium 136 - 145 mEq/L 138 137 136  Potassium 3.5 - 5.1 mEq/L 4.5 4.7 4.2  Chloride 96 - 106 mmol/L - 97 -  CO2 22 - 29 mEq/L _0 Calcium 8.4 - 10.4 mg/dL 9.0 8.8 8.8  Total Protein 6.4 - 8.3 g/dL 6.4 5.9(L) 6.2(L)  Total Bilirubin 0.20 - 1.20 mg/dL 0.31 0.2 <0.30  Alkaline Phos 40 - 150 U/L 127 99 96  AST 5 - 34 U/L _1 ALT 0 - 55 U/L _2 PATHOLOGY REPORT: Liver, needle/core biopsy, right lobe 03/27/2015  - METASTATIC ADENOCARCINOMA, SEE COMMENT. Microscopic Comment The adenocarcinoma demonstrates the following immunophenotype Cytokeratin 7 - strong diffuse expression. Cytokeratin 20 - negative expression. TTF-1 - patchy moderate strong expression. Napsin A - strong diffuse expression. CDX-2 - negative expression. PSA - negative expression. Overall, the morphology and immunophenotype are that of metastatic adenocarcinoma, primary to lung. There is tumor available for ancillary tumor testing. The case is reviewed with Dr. Lyndon Code who  concurs. The case was discussed with Dr Burr Medico on 03/29/2015. (CRR:gt:ecj, 03/29/15)  RADIOGRAPHIC STUDIES: I have personally reviewed the radiological images as listed and agreed with the findings in the report.  PET  09/16/2016 IMPRESSION: New hypermetabolic left upper and lower lobe pulmonary airspace disease since 06/03/2016 exam, most likely due to drug reaction or infection, with neoplasm considered much less likely.  New mild hypermetabolic right hilar lymphadenopathy.  Stable sclerotic bone metastases, without associated hypermetabolic activity.   Brain Wilkins with and without contrast 09/24/2016 IMPRESSION: 1. At least 21 new  subcentimeter brain metastases.  No edema. 2. Slightly decreased size of left frontal lobe lesion with mildly decreased surrounding edema.   CT chest w contrast 10/11/2016 IMPRESSION: 1. Progressive airspace consolidation within the left upper lobe and left lower lobe. There is also a new moderate volume left pleural effusion. As described previously differential considerations include inflammatory or infectious etiologies (drug reaction not excluded) as well as recurrent progressive tumor involvement of the lung. 2. There is new interlobular septal thickening which appears lower lobe predominant. This is a nonspecific finding and may be seen with lymphangitic spread of tumor as well as the fluid overload state. Careful clinical correlation is advised. 3. Similar appearance of sclerotic bone metastases.    ASSESSMENT & PLAN:  60 year old Caucasian male with minimal smoking history, obesity status post gastric bypass surgery, diabetes, presented with cardiac arrest and presumed seizure. Imaging study showed left lower lobe infiltrative consultation, left hilar adenopathy, diffuse liver metastasis and left front lobe brain metastasis.  1. 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 Images were reviewed in person. -We discussed that his cancer is incurable at this stage, but treatable. We now have a 3 ALK inhibitors available for this type of lung cancer, with very good response rate and significantly increase patient's survival. -I discussed his recent restaging PET scan findings from 06/03/2016. It showed complete metabolic response to Alectinib, small liver and sclerotic bone lesions are not hypermetabolic. He is tolerating Alectinib very well, and he is very excited about the excellent response -I reviewed today's  restaging scan images with patient in person, he has developed new infiltrative lesions in the left lung, which is similar to his initial mentation. Also radiologist feels this is likely inflammatory, he has no clinical signs of pneumonia, I'm concerned this is likely disease progression, also atypical pneumonia is also possibility. -His recent CT scan of the chest showed significant disease progression and new pleural effusion -a new ALK inhibitor Brigatinib has been approved recently by FDA for presumptive resistant ALK positive non-small cell lung cancer. Although it's uncertain if it overcome Alectinib resistance, I think it's worth of trying. Unfortunately his insurance has denied, and were working on the free drug supply from the pharmacy to accompany. -if Brigatinib is not available, I plan to start him on chemotherapy with carboplatin and Alimta, B12 injection today  -Due to his significant worsening dyspnea and hypoxia, I will admit him to the hospital today for thoracentesis  2. Brain mets -s/p SRS, follow-up with radiation oncology -He unfortunately had new brain metastasis, currently on whole brain radiation  3. Bone mets -on Xgeva,  He has received 1 year monthly treatment,  We changed it to every 3 months from 05/2016 -He is taking calcium 2-3 tab daily and vitamin D.  4. history of cardiac arrest -Likely  related to his breathing metastasis and seizure -Cardiac cath was negative for coronary artery disease  5. Seizure, secondary to brain metastasis -Continue Keppra, his does was increased in 10/2015  -He will follow up with neurologist in [place]. He has not driven since the seizure.  He had 2 small episodes of seizure in the past few weeks.  6.  diabetes  -Continue follow-up with primary care physician.   7. DVT and Pulmonary embolism, diagnosed on 06/15/2015 -Probably related to his underline malignancy -We previously discussed the option of anticoagulation, which include  Lovenox and Coumadin. Low molecular weight heparin (such as Lovenox) works better than coumadin to prevent recurrent thrombosis in cancer patients [facility] [phone]), and I recommend him continue lovenox indefinitely unless there is a contraindication such as recurrent bleeding. -he is on low dose of lovenox 138m daily now, due to the concern of tumor hemorrhage in brain. This was the concencus from CNS tumor board discussion.   9. [facility] -[facility] -continue mirtazapine 432mat bedtime, he is also on Cymbalta.  10. Anemia -mild and stable, developed since he started Alectinib, likely related  -will follow up closely, improved lately, he is on iron and B12 supplements  11. Hypocalcemia -secondary to Xgeva  -continue calcium 3 tab daily  -Hold Xgeva if correct calcium<8  12. Morbid obesity  -He has gained a lot of weight from steroids. He is off steroids now. He agrees to watch his diet closely.  Plan -Due to his significant dyspnea and hypoxia, [facility] -His last dose of Lovenox was 8 PM yesterday -He will continue hopefully radiation, plan to finish in 2 days -B12 injection today, in case he needs chemotherapy next week -I will request PDL 1 test on his previous biopsy, to see if is a candidate for immunotherapy   All questions were answered. The patient knows to call the clinic with any problems, questions or concerns.  I spent 30 minutes counseling the patient face to face. The total time spent in the appointment was 40 minutes and more than 50% was on counseling.     FeTruitt MerleMD 09/26/2016

## 2016-10-15 NOTE — Telephone Encounter (Signed)
Transported to 1324 @ [facility].  Report given to RN.  Pt using O2 @ 2L/min.  O2 sat 92%

## 2016-10-15 NOTE — Progress Notes (Signed)
Miralax and bisacodyl refused by patient this am,time changed to 2000 per wife's request.

## 2016-10-15 NOTE — Telephone Encounter (Signed)
This patient did not show for a revisit appointment today. 

## 2016-10-15 NOTE — Telephone Encounter (Signed)
Called admitting for med surg bed & notified managed care.  Waiting on bed.  Dr Burr Medico discussed with hospitalist.

## 2016-10-15 NOTE — Progress Notes (Signed)
Patient weak looking. Positive for SOB,high flow nasal cannula ongoing.Has cont.pulse ox at bedside,oxygen at 98%. Wife at bedside. Dr. Wynelle Cleveland notified of patient's condition. Will check for MD orders.

## 2016-10-15 NOTE — Progress Notes (Signed)
Patient  the from the [place], wheeled by RN, with wife. Alert and oriented x 3.Denies pain. Oriented to room and environment. Placed on oxygen at 3L/ nasal cannula the weaned to 2 L. Md notifiedof patient's admission. Will continue to monitor.

## 2016-10-16 ENCOUNTER — Ambulatory Visit
Admission: RE | Admit: 2016-10-16 | Discharge: 2016-10-16 | Disposition: A | Discharge status: Home / Self Care | Payer: Commercial Managed Care - PPO | Source: Ambulatory Visit | Attending: Radiation Oncology | Admitting: Radiation Oncology

## 2016-10-16 ENCOUNTER — Telehealth: Payer: Self-pay

## 2016-10-16 ENCOUNTER — Inpatient Hospital Stay (HOSPITAL_COMMUNITY): Payer: Commercial Managed Care - PPO

## 2016-10-16 ENCOUNTER — Encounter: Payer: Self-pay | Admitting: Radiation Oncology

## 2016-10-16 ENCOUNTER — Other Ambulatory Visit (HOSPITAL_COMMUNITY)
Admission: RE | Admit: 2016-10-16 | Discharge: 2016-10-16 | Disposition: A | Discharge status: Home / Self Care | Payer: Commercial Managed Care - PPO | Source: Ambulatory Visit | Attending: Hematology | Admitting: Hematology

## 2016-10-16 DIAGNOSIS — C7931 Secondary malignant neoplasm of brain: Secondary | ICD-10-CM

## 2016-10-16 DIAGNOSIS — R0600 Dyspnea, unspecified: Secondary | ICD-10-CM

## 2016-10-16 DIAGNOSIS — J9601 Acute respiratory failure with hypoxia: Secondary | ICD-10-CM

## 2016-10-16 DIAGNOSIS — C3492 Malignant neoplasm of unspecified part of left bronchus or lung: Secondary | ICD-10-CM | POA: Diagnosis not present

## 2016-10-16 DIAGNOSIS — Z9889 Other specified postprocedural states: Secondary | ICD-10-CM

## 2016-10-16 DIAGNOSIS — J90 Pleural effusion, not elsewhere classified: Secondary | ICD-10-CM

## 2016-10-16 DIAGNOSIS — E1169 Type 2 diabetes mellitus with other specified complication: Secondary | ICD-10-CM

## 2016-10-16 DIAGNOSIS — D630 Anemia in neoplastic disease: Secondary | ICD-10-CM

## 2016-10-16 DIAGNOSIS — E669 Obesity, unspecified: Secondary | ICD-10-CM

## 2016-10-16 DIAGNOSIS — I5032 Chronic diastolic (congestive) heart failure: Secondary | ICD-10-CM

## 2016-10-16 DIAGNOSIS — Z510 Encounter for antineoplastic radiation therapy: Secondary | ICD-10-CM | POA: Diagnosis not present

## 2016-10-16 DIAGNOSIS — J918 Pleural effusion in other conditions classified elsewhere: Secondary | ICD-10-CM

## 2016-10-16 LAB — CBC
HEMATOCRIT: 46.5 % (ref 39.0–52.0)
Hemoglobin: 15.7 g/dL (ref 13.0–17.0)
MCH: 31.3 pg (ref 26.0–34.0)
MCHC: 33.8 g/dL (ref 30.0–36.0)
MCV: 92.8 fL (ref 78.0–100.0)
PLATELETS: 237 10*3/uL (ref 150–400)
RBC: 5.01 MIL/uL (ref 4.22–5.81)
RDW: 15.7 % — AB (ref 11.5–15.5)
WBC: 13.1 10*3/uL — ABNORMAL HIGH (ref 4.0–10.5)

## 2016-10-16 LAB — LACTATE DEHYDROGENASE: LDH: 164 U/L (ref 98–192)

## 2016-10-16 LAB — LACTATE DEHYDROGENASE, PLEURAL OR PERITONEAL FLUID: LD FL: 677 U/L — AB (ref 3–23)

## 2016-10-16 LAB — GLUCOSE, CAPILLARY
Glucose-Capillary: 186 mg/dL — ABNORMAL HIGH (ref 65–99)
Glucose-Capillary: 204 mg/dL — ABNORMAL HIGH (ref 65–99)

## 2016-10-16 LAB — BODY FLUID CELL COUNT WITH DIFFERENTIAL
EOS FL: 16 %
LYMPHS FL: 17 %
Monocyte-Macrophage-Serous Fluid: 64 % (ref 50–90)
NEUTROPHIL FLUID: 3 % (ref 0–25)
Total Nucleated Cell Count, Fluid: 1223 cu mm — ABNORMAL HIGH (ref 0–1000)

## 2016-10-16 LAB — BASIC METABOLIC PANEL
Anion gap: 11 (ref 5–15)
BUN: 19 mg/dL (ref 6–20)
CHLORIDE: 97 mmol/L — AB (ref 101–111)
CO2: 27 mmol/L (ref 22–32)
CREATININE: 1.32 mg/dL — AB (ref 0.61–1.24)
Calcium: 8.4 mg/dL — ABNORMAL LOW (ref 8.9–10.3)
GFR calc Af Amer: >60 mL/min (ref >60)
GFR calc non Af Amer: 57 mL/min — ABNORMAL LOW (ref >60)
GLUCOSE: 182 mg/dL — AB (ref 65–99)
POTASSIUM: 4.6 mmol/L (ref 3.5–5.1)
Sodium: 135 mmol/L (ref 135–145)

## 2016-10-16 LAB — PROTEIN, TOTAL: TOTAL PROTEIN: 6.7 g/dL (ref 6.5–8.1)

## 2016-10-16 LAB — CHOLESTEROL, TOTAL: CHOLESTEROL: 202 mg/dL — AB (ref 0–200)

## 2016-10-16 LAB — ECHOCARDIOGRAM COMPLETE
HEIGHTINCHES: 72 in
Weight: 4800 oz

## 2016-10-16 LAB — GLUCOSE, SEROUS FLUID: Glucose, Fluid: 159 mg/dL

## 2016-10-16 LAB — PROTEIN, BODY FLUID: TOTAL PROTEIN, FLUID: 4.1 g/dL

## 2016-10-16 MED ORDER — SODIUM CHLORIDE 0.9 % IV SOLN
250.0000 mL | INTRAVENOUS | Status: DC | PRN
  Administered 2016-10-17: 250 mL via INTRAVENOUS

## 2016-10-16 MED ORDER — MORPHINE BOLUS VIA INFUSION
2.0000 mg | INTRAVENOUS | Status: DC | PRN
  Administered 2016-10-17 (×4): 2 mg via INTRAVENOUS
  Filled 2016-10-16: qty 2

## 2016-10-16 MED ORDER — SODIUM CHLORIDE 0.9% FLUSH: 3.0000 mL | INTRAVENOUS | Status: DC | PRN

## 2016-10-16 MED ORDER — GLYCOPYRROLATE 1 MG PO TABS
1.0000 mg | ORAL_TABLET | ORAL | Status: DC | PRN
  Filled 2016-10-16: qty 1

## 2016-10-16 MED ORDER — MORPHINE SULFATE (PF) 2 MG/ML IV SOLN
2.0000 mg | INTRAVENOUS | Status: DC | PRN
  Administered 2016-10-16 (×3): 2 mg via INTRAVENOUS
  Filled 2016-10-16 (×3): qty 1

## 2016-10-16 MED ORDER — GLYCOPYRROLATE 0.2 MG/ML IJ SOLN
0.2000 mg | INTRAMUSCULAR | Status: DC | PRN
  Filled 2016-10-16: qty 1

## 2016-10-16 MED ORDER — HALOPERIDOL LACTATE 2 MG/ML PO CONC
0.5000 mg | ORAL | Status: DC | PRN
  Filled 2016-10-16: qty 0.3

## 2016-10-16 MED ORDER — HALOPERIDOL 0.5 MG PO TABS
0.5000 mg | ORAL_TABLET | ORAL | Status: DC | PRN
  Filled 2016-10-16: qty 1

## 2016-10-16 MED ORDER — PERFLUTREN LIPID MICROSPHERE
INTRAVENOUS | Status: AC
  Filled 2016-10-16: qty 10

## 2016-10-16 MED ORDER — BIOTENE DRY MOUTH MT LIQD: 15.0000 mL | OROMUCOSAL | Status: DC | PRN

## 2016-10-16 MED ORDER — LORAZEPAM 2 MG/ML PO CONC: 1.0000 mg | ORAL | Status: DC | PRN

## 2016-10-16 MED ORDER — LORAZEPAM 2 MG/ML PO CONC: 1.0000 mg | Freq: Two times a day (BID) | ORAL | Status: DC

## 2016-10-16 MED ORDER — HALOPERIDOL LACTATE 5 MG/ML IJ SOLN: 0.5000 mg | INTRAMUSCULAR | Status: DC | PRN

## 2016-10-16 MED ORDER — ADULT MULTIVITAMIN W/MINERALS CH
1.0000 | ORAL_TABLET | Freq: Every day | ORAL | Status: DC
  Filled 2016-10-16: qty 1

## 2016-10-16 MED ORDER — SODIUM CHLORIDE 0.9% FLUSH
3.0000 mL | Freq: Two times a day (BID) | INTRAVENOUS | Status: DC
  Administered 2016-10-16: 3 mL via INTRAVENOUS

## 2016-10-16 MED ORDER — SODIUM CHLORIDE 0.9 % IV SOLN
3.0000 mg/h | INTRAVENOUS | Status: DC
  Administered 2016-10-16: 2 mg/h via INTRAVENOUS
  Filled 2016-10-16: qty 10
  Filled 2016-10-16: qty 5

## 2016-10-16 MED ORDER — LORAZEPAM 1 MG PO TABS: 1.0000 mg | ORAL_TABLET | ORAL | Status: DC | PRN

## 2016-10-16 MED ORDER — LORAZEPAM 2 MG/ML IJ SOLN: 1.0000 mg | INTRAMUSCULAR | Status: DC | PRN

## 2016-10-16 MED ORDER — POLYVINYL ALCOHOL 1.4 % OP SOLN
1.0000 [drp] | Freq: Four times a day (QID) | OPHTHALMIC | Status: DC | PRN
  Filled 2016-10-16: qty 15

## 2016-10-16 NOTE — Progress Notes (Signed)
   Name: Walter R Kuennen Jr. MRN: [id number] DOB: 10/16/1956    ADMISSION DATE:  09/26/2016 CONSULTATION DATE:  10/24  REFERRING MD : Rizwan   CHIEF COMPLAINT:  Hypoxia and pleural effusion   SIGNIFICANT EVENTS  Left thora 10/25: 1.5 liters port-wine appearing.    STUDIES:    SUBJECTIVE:  Lethargic after XRT today  VITAL SIGNS: Temp:  [97.8 F (36.6 C)-98.4 F (36.9 C)] 98.2 F (36.8 C) (10/25 0640) Pulse Rate:  [92-111] 102 (10/25 0640) Resp:  [12-28] 12 (10/25 0640) BP: (124-172)/(97-111) 126/99 (10/25 0640) SpO2:  [92 %-97 %] 96 % (10/25 0640) 10 liters  PHYSICAL EXAMINATION: General:  60 year old obese white male, resting in bed. + accessory use. Sedated today. Neuro:  Awake, lethargic but will arouse and f/c no focal def.  HEENT:  NCAT, no JVD Cardiovascular:  RRR w/out MRG Lungs:  Crackles posterior. + accessory use, decreased breath sounds on left  Abdomen:  Soft, not tender + bowel sounds Musculoskeletal:  Equal st and bulk  Skin:  Warm and dry    Recent Labs Lab 09/22/2016 0814 10/16/16 0354  NA 137 135  K 4.2 4.6  CL  --  97*  CO2 25 27  BUN 14.3 19  CREATININE 0.9 1.32*  GLUCOSE 164* 182*    Recent Labs Lab 10/02/2016 0814 10/16/16 0354  HGB 15.0 15.7  HCT 45.5 46.5  WBC 11.3* 13.1*  PLT 207 237   Dg Chest Port 1 View  Result Date: 10/14/2016 CLINICAL DATA:  Hypoxia.  Lung cancer with metastatic disease. EXAM: PORTABLE CHEST 1 VIEW COMPARISON:  CT chest 10/11/2016 FINDINGS: Moderately large left effusion has progressed. Left lower lobe collapse. Left upper lobe atelectasis. Left lower lobe mass lesion difficult to see on the current study. Pulmonary vascular congestion. Prominent interstitial lung markings on the right may reflect lymphangitic spread of tumor versus interstitial edema. No effusion on the right. IMPRESSION: Enlarging left effusion. Left upper lobe left lower lobe atelectasis. Left lower lobe mass lesion is noted on prior studies  Prominent interstitial markings in the right may represent lymphangitic spread of tumor versus interstitial edema. Electronically Signed   By: Charles  Clark M.D.   On: 10/04/2016 14:59    ASSESSMENT / PLAN:  Metastatic Lung Cancer. Adenocarcinoma w/ mets to brain, LNs, liver and bone. ALK-EML4 translocation +.  Left Pleural effusion (exudative) Possible Lymphangitic spread  Acute Hypoxic Respiratory Failure  Remote h/o PE-->2016 (on LMWH) Anemia  Depression  H/o cardiac arrest   Discussion  This is a 60 year old male who presented w/ acute hypoxic respiratory failure in setting of progression of his cancer and lymphangitic spread w/ work of breathing further complicated by what is likely a malignant effusion. We drained 1.5 liters of port-wine appearing pleural fluid. If this is indeed malignant he will need Pleur-x  Plan CXR now Will send Pleural fluid for analysis  Likely will need to consider Pleur-x for palliative management  Hold LMWH for another 24hrs He is being followed by Palliative. Might be reasonable to start getting hospice involved.    E  ACNP-BC [facility] Pager # [phone] OR # [phone] if no answer   10/16/2016, 12:17 PM  

## 2016-10-16 NOTE — Progress Notes (Signed)
Walter Wilkins.   DOB:[birth year]   YO#:[id number]   DXA#:[id number]  Oncology follow-up  Subjective: Patient was admitted from my office yesterday for hypoxia and dyspnea. His condition has drastically deteriorated over night, he has required high oxygen flow, had mental status change, and his dyspnea and hypoxia did not improve after his thoracentesis this afternoon, which removed 1.5 L fluids. Pt was quite drowsy when I saw him this afternoon, with shallow breathing, family members at room.    Objective:  Vitals:   10/16/16 0640 10/16/16 1438  BP: (!) 126/99 (!) 128/103  Pulse: (!) 102 (!) 113  Resp: 12 14  Temp: 98.2 F (36.8 C) 98 F (36.7 C)    Body mass index is 40.69 kg/m.  Intake/Output Summary (Last 24 hours) at 10/16/16 1757 Last data filed at 10/16/16 1749  Gross per 24 hour  Intake           432.47 ml  Output              675 ml  Net          -242.53 ml    Drowsy, with shallow breathing   Sclerae unicteric  No peripheral adenopathy  Lungs clear -- decreased breath sounds in the left chest  Heart regular rate and rhythm  Abdomen benign  MSK no focal spinal tenderness, no peripheral edema  Neuro nonfocal   CBG (last 3)   Recent Labs  10/19/2016 2110 10/16/16 0757 10/16/16 1234  GLUCAP 221* 186* 204*     Labs:  Lab Results  Component Value Date   WBC 13.1 (H) 10/16/2016   HGB 15.7 10/16/2016   HCT 46.5 10/16/2016   MCV 92.8 10/16/2016   PLT 237 10/16/2016   NEUTROABS 8.0 (H) 10/07/2016   CMP Latest Ref Rng & Units 10/16/2016 10/09/2016 09/16/2016  Glucose 65 - 99 mg/dL 182(H) 164(H) 186(H)  BUN 6 - 20 mg/dL 19 14.3 14.0  Creatinine 0.61 - 1.24 mg/dL 1.32(H) 0.9 1.1  Sodium 135 - 145 mmol/L 135 137 138  Potassium 3.5 - 5.1 mmol/L 4.6 4.2 4.5  Chloride 101 - 111 mmol/L 97(L) - -  CO2 22 - 32 mmol/L 27 25 29   Calcium 8.9 - 10.3 mg/dL 8.4(L) 9.1 9.0  Total Protein 6.5 - 8.1 g/dL 6.7 6.8 6.4  Total Bilirubin 0.20 - 1.20 mg/dL - 0.39 0.31  Alkaline  Phos 40 - 150 U/L - 132 127  AST 5 - 34 U/L - 12 14  ALT 0 - 55 U/L - 8 12     Urine Studies No results for input(s): UHGB, CRYS in the last 72 hours.  Invalid input(s): UACOL, UAPR, USPG, UPH, UTP, UGL, UKET, UBIL, UNIT, UROB, ULEU, UEPI, UWBC, URBC, UBAC, CAST, UCOM, BILUA  Basic Metabolic Panel:  Recent Labs Lab 09/26/2016 0814 10/16/16 0354  NA 137 135  K 4.2 4.6  CL  --  97*  CO2 25 27  GLUCOSE 164* 182*  BUN 14.3 19  CREATININE 0.9 1.32*  CALCIUM 9.1 8.4*   GFR Estimated Creatinine Clearance: 84 mL/min (by C-G formula based on SCr of 1.32 mg/dL (H)). Liver Function Tests:  Recent Labs Lab 10/01/2016 0814 10/16/16 1245  AST 12  --   ALT 8  --   ALKPHOS 132  --   BILITOT 0.39  --   PROT 6.8 6.7  ALBUMIN 3.2*  --    No results for input(s): LIPASE, AMYLASE in the last 168 hours. No results for input(s):  AMMONIA in the last 168 hours. Coagulation profile  Recent Labs Lab 09/26/2016 1125  INR 0.94    CBC:  Recent Labs Lab 10/02/2016 0814 10/16/16 0354  WBC 11.3* 13.1*  NEUTROABS 8.0*  --   HGB 15.0 15.7  HCT 45.5 46.5  MCV 93.8 92.8  PLT 207 237   Cardiac Enzymes: No results for input(s): CKTOTAL, CKMB, CKMBINDEX, TROPONINI in the last 168 hours. BNP: Invalid input(s): POCBNP CBG:  Recent Labs Lab 10/09/2016 1249 10/03/2016 1745 10/12/2016 2110 10/16/16 0757 10/16/16 1234  GLUCAP 150* 192* 221* 186* 204*   D-Dimer No results for input(s): DDIMER in the last 72 hours. Hgb A1c No results for input(s): HGBA1C in the last 72 hours. Lipid Profile No results for input(s): CHOL, HDL, LDLCALC, TRIG, CHOLHDL, LDLDIRECT in the last 72 hours. Thyroid function studies No results for input(s): TSH, T4TOTAL, T3FREE, THYROIDAB in the last 72 hours.  Invalid input(s): FREET3 Anemia work up  [employer]  10/17/2016 0814  RETICCTPCT 1.89*   Microbiology No results found for this or any previous visit (from the past 240 hour(s)).    Studies:  Dg  Chest Port 1 View  Result Date: 10/16/2016 CLINICAL DATA:  Post thoracentesis EXAM: PORTABLE CHEST 1 VIEW COMPARISON:  10/09/2016 FINDINGS: There is decreased in partially loculated left pleural effusion. Cardiomegaly again noted. No pulmonary edema. Persistent left lower lobe consolidation. No pneumothorax. IMPRESSION: Decreased partial loculated left pleural effusion. Persistent left lower lobe consolidation. No pneumothorax. Electronically Signed   By: Lahoma Crocker M.D.   On: 10/16/2016 13:09   Dg Chest Port 1 View  Result Date: 10/06/2016 CLINICAL DATA:  Hypoxia.  Lung cancer with metastatic disease. EXAM: PORTABLE CHEST 1 VIEW COMPARISON:  CT chest 10/11/2016 FINDINGS: Moderately large left effusion has progressed. Left lower lobe collapse. Left upper lobe atelectasis. Left lower lobe mass lesion difficult to see on the current study. Pulmonary vascular congestion. Prominent interstitial lung markings on the right may reflect lymphangitic spread of tumor versus interstitial edema. No effusion on the right. IMPRESSION: Enlarging left effusion. Left upper lobe left lower lobe atelectasis. Left lower lobe mass lesion is noted on prior studies Prominent interstitial markings in the right may represent lymphangitic spread of tumor versus interstitial edema. Electronically Signed   By: Franchot Gallo M.D.   On: 10/20/2016 14:59    Assessment: 60 y.o. with stage IV lung cancer, ALK translocation (+) , recently progressed on ALK inhibitor alectinib, on WBRT for new brain mets, was admitted for worsening dyspnea and hypoxic respiratory failure  1. Hypoxic respiratory failure, secondary to his rapid lung cancer progression 2. Encephalopathy, secondary to #1 3. Metastatic lung cancer, recently progressed 4. Brain metastasis, on whole brain radiation   Plan:  Patient's overall condition has drastically deteriorated since yesterday. Giving his respiratory failure, rapid disease progression, pt and family  has decided comfort care, which I think it's very reasonable and I agree with it. I spoke with his wife Walter Wilkins, daughter, and other family members this afternoon.  I have informed Dr. Isidore Moos to stop radiation.  Appreciate palliative care team assistance and Hospitalist team care.   [name], MD 10/16/2016  5:57 PM

## 2016-10-16 NOTE — Progress Notes (Signed)
Pt was resting when [place] arrived; a host of family members were bedside including his wife, son (who joined later), aunts, uncles and cousins. Pt's wife enjoyed talking about family, their faith and there marriage. She shared the history of her husband's illness. She was very complimentary of the pt care she has rec'd at [place]. Pt's wife requested prayer saying they can't have too much. She was tearful following prayer and thankful. Please page if additional support is needed.  Chaplain Ernest Haber, M.Div.   10/16/16 2000  Clinical Encounter Type  Visited With Patient and family together

## 2016-10-16 NOTE — Progress Notes (Addendum)
No charge note.  Late day addendum:  Received a phone call from Wife, Ivin Booty.  Patient is struggling to breathe.  Very uncomfortable even after a large amount of fluid was removed with thoracentesis.   She requests full comfort measures.  She understands he is not likely to survive.  I spoke on the phone with the bedside RN, Butch Penny, and conferred over orders.     I will follow up in the morning to ensure he is comfortable.  Hospital death anticipated.  Imogene Burn, [place] [facility] Pager: [phone]

## 2016-10-16 NOTE — Progress Notes (Signed)
PROGRESS NOTE    Walter Wilkins.  VOH:[id number] DOB: 13-Sep-1956 DOA: 09/26/2016 PCP: Yong Channel, MD    Brief Narrative:  60 y.o. 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 Found to have mod L pleural effusion and possible lymphangitic spread of tumor vs fluid overlaod on CT on 10/20. Pulse ox 74% on room air in office.   Acute Respiratory failure with hypoxia  -Respiratory failure continues to worsen this hospital admission -Patient noted to be quite hypoxic this morning. Appreciate input by pulmonary critical care -Patient is now status post bedside thoracentesis, yielding 1.5 L of fluid -Despite large volume thoracentesis, patient remains markedly short of breath -Discussed case with pulmonary as well as palliative care -Overall prognosis is poor to grim -On the afternoon of 10/16/2016, patient's condition continued to deteriorate rapidly. -Discussed case with palliative care who recommended initiating full comfort measures and to start morphine drip -Chaplain at bedside -Anticipate likely hospital death -Will follow    Chronic diastolic heart failure grade 1 , murmur RUS border - 2-D echocardiogram was ordered and performed, pending results    Metastatic lung cancer (metastasis from lung to other site)  -Discussed case with Dr. Burr Medico -Overall prognosis at this time is grim. Per oncology, patient would likely not benefit from continued chemotherapy. 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)   Brain metastasis ([place])   Anemia in neoplastic disease   Personal history of venous thrombosis and embolism   Pleural effusion on left   Dyspnea   S/P thoracentesis    Acute Respiratory failure with hypoxia  -Respiratory failure continues to worsen this hospital admission -Patient noted to be quite hypoxic this morning. Appreciate input by pulmonary critical care -Patient is now status post bedside thoracentesis, yielding 1.5 L of fluid -Despite large volume thoracentesis, patient remains markedly short of breath -Discussed case with pulmonary as well as palliative care -Overall prognosis is poor to grim -On the afternoon of 10/16/2016, patient's condition continued to deteriorate rapidly. -Discussed case with palliative care who recommended initiating full comfort measures and to start morphine drip -Chaplain at bedside -  Anticipate likely hospital death -Will follow    Chronic diastolic heart failure grade 1 , murmur RUS border - 2-D echocardiogram was  ordered and performed, pending results    Metastatic lung cancer (metastasis from lung to other site)  -Discussed case with Dr. Burr Medico -Overall prognosis at this time is grim. Per oncology, patient would likely not benefit from continued chemotherapy. 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 -Continued on Keppra and Tegretol per home regimen    Diabetes mellitus type 2 in obese  -Comfort care measures now -Comfort feeds on an as-tolerated basis  Severe constipation -Cathartics to be given on as-needed basis  Weight loss/ anorexia -Full comfort measures at this time  DVT prophylaxis: Comfort care Code Status: DO NOT RESUSCITATE, comfort measures only Family Communication: Patient in room, family at bedside Disposition Plan: Anticipate hospital death  Consultants:   Oncology  Palliative care  Pulmonary  Procedures:   Thoracentesis 10/16/2016  Antimicrobials: Anti-infectives    None      Subjective: Patient lethargic, unable to assess  Objective: Vitals:   10/16/16 0435 10/16/16 0602 10/16/16 0640 10/16/16 1438  BP:   (!) 126/99 (!) 128/103  Pulse: 97 (!) 102 (!) 102 (!) 113  Resp:   12 14  Temp:   98.2 F (36.8 C) 98 F (36.7 C)  TempSrc:   Axillary Oral  SpO2: 94% 93% 96% 93%  Weight:      Height:        Intake/Output Summary (Last 24 hours) at 10/16/16 1723 Last data filed at 10/16/16 1646   Gross per 24 hour  Intake              430 ml  Output              675 ml  Net             -245 ml   Filed Weights   10/04/2016 1016  Weight: 136.1 kg (300 lb)    Examination:  General exam: Appears short of breath, dyspneic  Respiratory system: Clear to auscultation. Increased respiratory effort, no audible wheezing Cardiovascular system: Tachycardic, S1-S2 Gastrointestinal system: Nondistended, positive bowel sounds, Central nervous system: Lethargic, arousable, no seizures Extremities: Perfused, no clubbing Skin: Normal skin turgor, no notable skin lesions seen Psychiatry: When awake, mood appears normal, no visual hallucinations   Data Reviewed: I have personally reviewed following labs and imaging studies  CBC:  Recent Labs Lab 09/26/2016 0814 10/16/16 0354  WBC 11.3* 13.1*  NEUTROABS 8.0*  --   HGB 15.0 15.7  HCT 45.5 46.5  MCV 93.8 92.8  PLT 207 485   Basic Metabolic Panel:  Recent Labs Lab 10/16/2016 0814 10/16/16 0354  NA 137 135  K 4.2 4.6  CL  --  97*  CO2 25 27  GLUCOSE 164* 182*  BUN 14.3 19  CREATININE 0.9 1.32*  CALCIUM 9.1 8.4*   GFR: Estimated Creatinine Clearance: 84 mL/min (by C-G  formula based on SCr of 1.32 mg/dL (H)). Liver Function Tests:  Recent Labs Lab 10/09/2016 0814 10/16/16 1245  AST 12  --   ALT 8  --   ALKPHOS 132  --   BILITOT 0.39  --   PROT 6.8 6.7  ALBUMIN 3.2*  --    No results for input(s): LIPASE, AMYLASE in the last 168 hours. No results for input(s): AMMONIA in the last 168 hours. Coagulation Profile:  Recent Labs Lab 10/11/2016 1125  INR 0.94   Cardiac Enzymes: No results for input(s): CKTOTAL, CKMB, CKMBINDEX, TROPONINI in the last 168 hours. BNP (last 3 results) No results for input(s): PROBNP in the last 8760 hours. HbA1C: No results for input(s): HGBA1C in the last 72 hours. CBG:  Recent Labs Lab 10/08/2016 1249 10/12/2016 1745 10/04/2016 2110 10/16/16 0757 10/16/16 1234  GLUCAP 150* 192* 221* 186*  204*   Lipid Profile: No results for input(s): CHOL, HDL, LDLCALC, TRIG, CHOLHDL, LDLDIRECT in the last 72 hours. Thyroid Function Tests: No results for input(s): TSH, T4TOTAL, FREET4, T3FREE, THYROIDAB in the last 72 hours. Anemia Panel:  Recent Labs  10/22/2016 0814  RETICCTPCT 1.89*   Sepsis Labs: No results for input(s): PROCALCITON, LATICACIDVEN in the last 168 hours.  No results found for this or any previous visit (from the past 240 hour(s)).   Radiology Studies: Dg Chest Port 1 View  Result Date: 10/16/2016 CLINICAL DATA:  Post thoracentesis EXAM: PORTABLE CHEST 1 VIEW COMPARISON:  10/12/2016 FINDINGS: There is decreased in partially loculated left pleural effusion. Cardiomegaly again noted. No pulmonary edema. Persistent left lower lobe consolidation. No pneumothorax. IMPRESSION: Decreased partial loculated left pleural effusion. Persistent left lower lobe consolidation. No pneumothorax. Electronically Signed   By: Lahoma Crocker M.D.   On: 10/16/2016 13:09   Dg Chest Port 1 View  Result Date: 09/24/2016 CLINICAL DATA:  Hypoxia.  Lung cancer with metastatic disease. EXAM: PORTABLE CHEST 1 VIEW COMPARISON:  CT chest 10/11/2016 FINDINGS: Moderately large left effusion has progressed. Left lower lobe collapse. Left upper lobe atelectasis. Left lower lobe mass lesion difficult to see on the current study. Pulmonary vascular congestion. Prominent interstitial lung markings on the right may reflect lymphangitic spread of tumor versus interstitial edema. No effusion on the right. IMPRESSION: Enlarging left effusion. Left upper lobe left lower lobe atelectasis. Left lower lobe mass lesion is noted on prior studies Prominent interstitial markings in the right may represent lymphangitic spread of tumor versus interstitial edema. Electronically Signed   By: Franchot Gallo M.D.   On: 09/29/2016 14:59    Scheduled Meds: . bisacodyl  10 mg Rectal Once  . carbamazepine  200 mg Oral BID  .  DULoxetine  90 mg Oral Daily  . levETIRAcetam  1,500 mg Oral BID  . LORazepam  1 mg Oral BID  . mirtazapine  45 mg Oral QHS  . pentoxifylline  400 mg Oral BID  . polyethylene glycol  17 g Oral Once  . sodium chloride flush  3 mL Intravenous Q12H  . sodium chloride flush  3 mL Intravenous Q12H   Continuous Infusions: . morphine 2 mg/hr (10/16/16 1635)     LOS: 1 day   CHIU, Orpah Melter, MD [facility] Pager [phone]  If 7PM-7AM, please contact night-coverage [email or web address] Password [email or web address] 10/16/2016, 5:23 PM

## 2016-10-16 NOTE — Progress Notes (Signed)
  Echocardiogram 2D Echocardiogram has been performed.  Walter Wilkins 10/16/2016, 3:17 PM

## 2016-10-16 NOTE — Telephone Encounter (Signed)
I called Inpatient Oncology and spoke with Walter Wilkins (Walter Wilkins [facility] nurse today). During the first phone call she was unsure if he would be able to be treated today. She called me back very quickly and stated that he would be able to be treated today at 9:30. She had recently given him pain medicine, and reported that he would need to be transferred with oxygen. I called [facility] and reported this to Faith. She told me she would have a transporter on Inpatient Oncology to have Walter Wilkins here for radiation treatment at 9:30

## 2016-10-16 NOTE — Progress Notes (Signed)
Morphine drip started per order,witnessed by Threasa Beards RN.

## 2016-10-16 NOTE — Progress Notes (Signed)
Chaplain at bedside with Walter Wilkins and extended family in response to referral by RN, Spiritual care consult re: end of life.    Walter Wilkins present with family.  Spouse providing emotional support with Walter Wilkins and with grandchildren.  Walter Wilkins responds in affirmation to brothers as they ask if comfort measures are his wishes.    Chaplain providing spiritual and emotional support around anticipatory grief.

## 2016-10-16 NOTE — Procedures (Signed)
Thoracentesis Procedure Note  Pre-operative Diagnosis: pleural effusion   Post-operative Diagnosis: same  Indications: evaluation of pleural fluid as well as treatment of dyspnea   Procedure Details  Consent: Informed consent was obtained. Risks of the procedure were discussed including: infection, bleeding, pain, pneumothorax.  Under sterile conditions the patient was positioned. Betadine solution and sterile drapes were utilized.  1% buffered lidocaine was used to anesthetize the  rib space which was identified via real-time [place]. Fluid was obtained without any difficulties and minimal blood loss.  A dressing was applied to the wound and wound care instructions were provided.   Findings 1500 ml of bloody pleural fluid was obtained. A sample was sent to Pathology for cytogenetics, flow, and cell counts, as well as for infection analysis.  Complications:  None; patient tolerated the procedure well.          Condition: stable  Plan A follow up chest x-ray was ordered. Bed Rest for 0 hours.   Erick Colace ACNP-BC [place] Pager # [phone] OR # [phone] if no answer

## 2016-10-16 NOTE — Consult Note (Signed)
Consultation Note Date: 10/16/2016   Patient Name: Walter Wilkins.  DOB: 18-Dec-1956  MRN: [id number]  Age / Sex: 60 y.o., male  PCP: [name]. [name], MD Referring Physician: Donne Hazel, MD  Reason for Consultation: Hospice Evaluation and Non pain symptom management, continued goals of care discussion  HPI/Patient Profile: 60 y.o. male  with past medical history of DM, gastric bypass, and metastatic lung cancer including mets to the brain, liver and bone, who was admitted from the oncology office on 10/22/2016 with dyspnea. He was found to have a new left pleural effusion and possible pneumonia.  The patient is unable to speak with me as he is aspirating and appears extremely fatigued.  He had radiation treatments this morning.  He has work of breathing.  His wife admits he is struggling.  A thoracentesis is scheduled which will hopefully bring him relief of his dyspnea.  He was seen in the oncology clinic by Palliative Medicine NP Wadie Lessen on Monday 10/23.  At that point the patient was hopeful for continued chemo and whole brain radiation treatment, but comfort and dignity important priorities as well.  Of note his wife is an Therapist, sports at [employer].  She is very appreciative of the care she and her husband are receiving here at [facility].  Clinical Assessment and Goals of Care: Per wife there is a concern the pleural effusion is gel rather than liquid.  If so there will be no way to effectively drain it.  Wife states - if that happens she will change Goal to Full Comfort (no further radiation, chemo, etc).    I have given her my card and asked her to call me after she knows the results of the thoracentesis.  Primary Decision Maker:  HCPOA, Wife.    SUMMARY OF RECOMMENDATIONS   Will touch base with wife and patient after thoracentesis attempt.  Pending results may change to full comfort at  that point.  If so, will [facility] [place].  Wife specifically does not want [facility] due to concern over poor care that happened there 3 yrs ago.  PMT will round again on 10/26.  Code Status/Advance Care Planning:  DNR    Symptom Management:   Morphine PRN (increased frequency to q 15 min PRN) for dyspnea.  Monitor BM.  Wife states we may need to consider laxative 10/26.  Palliative Prophylaxis:   Aspiration  Psycho-social/Spiritual:   Desire for further Chaplaincy support:yes -   Additional Recommendations: Caregiving  Support/Resources  Prognosis:   Will know more after thoracentesis, but at best given progression of disease he appears to have weeks - months.  Discharge Planning: To Be Determined      Primary Diagnoses: Present on Admission: . Anemia in neoplastic disease . Brain metastasis ([place]) . Chronic diastolic heart failure ([place]) . Diabetes mellitus type 2 in obese ([place]) . HTN (hypertension) . Metastatic lung cancer (metastasis from lung to other site) [facility]) . Respiratory failure  with hypoxia ([place])   I have reviewed the medical record, interviewed the patient and family, and examined the patient. The following aspects are pertinent.  Past Medical History:  Diagnosis Date  . Acid reflux   . Brain mass 03/24/15   ct head - left frontal lobe mass consistent with a brain metastasis  . Cancer ([place])    mass on lung, brain, sinuses, liver and bones  . Diabetes mellitus without complication ([place])   . H/O gastric bypass    a. ~2011 at [facility].  . Seizures ([place]) 03/23/15   Social History   Social History  . Marital status: Married    Spouse name: N/A  . Number of children: 2  . Years of education: 14   Occupational History  .      not currently working   Social History Main Topics  . Smoking status: Never Smoker  . Smokeless tobacco: Never Used  . Alcohol use 0.0 oz/week     Comment:  Occasionally socially - twice a week   . Drug use: No  . Sexual activity: Not Currently   Other Topics Concern  . None   Social History Narrative   Patient drinks about 2-3 cups of coffee daily.   Patient is right handed.   Family History  Problem Relation Age of Onset  . Valvular heart disease Father     H/o pig valve  . Cancer Father     skin cancer   . Breast cancer Mother   . Cancer Mother 45    breast cancer   . Cancer Brother 42    base of tongue   . Cancer Maternal Uncle     throat cancer   . Cancer Maternal Uncle 90    colon cancer, and prostate cancer   . Cancer Cousin     lung cancer   . Sudden death Neg Hx     No family history of sudden cardiac death   Scheduled Meds: . alectinib  600 mg Oral BID  . atorvastatin  40 mg Oral QHS  . bisacodyl  10 mg Rectal Once  . carbamazepine  200 mg Oral BID  . DULoxetine  90 mg Oral Daily  . feeding supplement (GLUCERNA SHAKE)  237 mL Oral TID BM  . feeding supplement (PRO-STAT SUGAR FREE 64)  30 mL Oral BID  . insulin aspart  0-15 Units Subcutaneous TID WC  . insulin aspart  0-5 Units Subcutaneous QHS  . levETIRAcetam  1,500 mg Oral BID  . mirtazapine  45 mg Oral QHS  . multivitamin with minerals  1 tablet Oral Daily  . pantoprazole  40 mg Oral Daily  . pentoxifylline  400 mg Oral BID  . polyethylene glycol  17 g Oral Once  . sodium chloride flush  3 mL Intravenous Q12H  . traZODone  50 mg Oral QHS   Continuous Infusions:  PRN Meds:.sodium chloride, acetaminophen **OR** acetaminophen, ALPRAZolam, bisacodyl, morphine injection, ondansetron **OR** ondansetron (ZOFRAN) IV, polyethylene glycol, sodium chloride flush No Known Allergies Review of Systems:  Patient aspirating and unable to speak  Physical Exam Well developed obese male with tan skin.  Appears very fatigued, coughing on a sip of water CV rrr, no m/r/g Resp increased work of breathing Abdomen:  Soft, NT, +hernia at umbilicus Ext:  Able to move all  4  Vital Signs: BP (!) 126/99 (BP Location: Right Arm)   Pulse (!) 102   Temp 98.2 F (36.8 C) (Axillary)  Resp 12   Ht 6' (1.829 m)   Wt 136.1 kg (300 lb)   SpO2 96%   BMI 40.69 kg/m  Pain Assessment: 0-10 POSS *See Group Information*: 2-Acceptable,Slightly drowsy, easily aroused Pain Score: 4    SpO2: SpO2: 96 % O2 Device:SpO2: 96 % O2 Flow Rate: .O2 Flow Rate (L/min): 10 L/min  IO: Intake/output summary:  Intake/Output Summary (Last 24 hours) at 10/16/16 1113 Last data filed at 10/16/16 0759  Gross per 24 hour  Intake              430 ml  Output              925 ml  Net             -495 ml    LBM: Last BM Date: 10/10/16 Baseline Weight: Weight: 136.1 kg (300 lb) Most recent weight: Weight: 136.1 kg (300 lb)     Palliative Assessment/Data:   Flowsheet Rows   Flowsheet Row Most Recent Value  Intake Tab  Referral Department  Hospitalist  Unit at Time of Referral  Oncology Unit  Palliative Care Primary Diagnosis  Pulmonary  Date Notified  10/01/2016  Palliative Care Type  Return patient Palliative Care  Reason for referral  Clarify Goals of Care, Non-pain Symptom  Date of Admission  10/13/2016  Date first seen by Palliative Care  10/16/16  # of days Palliative referral response time  1 Day(s)  # of days IP prior to Palliative referral  0  Clinical Assessment  Palliative Performance Scale Score  20%  Dyspnea Max Last 24 Hours  7  Dyspnea Min Last 24 hours  4  Psychosocial & Spiritual Assessment  Palliative Care Outcomes  Patient/Family meeting held?  Yes  Who was at the meeting?  patient, wife, brother, son multiple family members and friend.  Palliative Care Outcomes  Clarified goals of care      Time In: 11:00 Time Out: 11:40 Time Total: 40 min. Greater than 50%  of this time was spent counseling and coordinating care related to the above assessment and plan.  Signed by: Imogene Burn, PA-C Palliative Medicine Pager: [phone]  Please contact  Palliative Medicine Team phone at [phone] for questions and concerns.  For individual provider: See Shea Evans

## 2016-10-17 ENCOUNTER — Encounter: Payer: Self-pay | Admitting: Neurology

## 2016-10-17 ENCOUNTER — Ambulatory Visit: Payer: Commercial Managed Care - PPO

## 2016-10-17 DIAGNOSIS — J9621 Acute and chronic respiratory failure with hypoxia: Secondary | ICD-10-CM

## 2016-10-17 DIAGNOSIS — Z515 Encounter for palliative care: Secondary | ICD-10-CM

## 2016-10-17 LAB — PH, BODY FLUID: PH, BODY FLUID: 7

## 2016-10-17 NOTE — Progress Notes (Signed)
Daily Progress Note   Patient Name: Walter Wilkins.       Date: 10/17/2016 DOB: [birth year]  Age: 60 y.o. MRN#: [id number] Attending Physician: Donne Hazel, MD Primary Care Physician: Yong Channel, MD Admit Date: 09/25/2016  Reason for Consultation/Follow-up: Terminal Care  Subjective: Patient unresponsive.  Wife, son, and dtr in law at bedside.  Wife very tearful & exhausted.  She expresses thanks for the care on 3W and the Chaplain's visit yesterday.  She understands patient is dying.  Length of Stay: 2  Current Medications: Scheduled Meds:  . bisacodyl  10 mg Rectal Once  . carbamazepine  200 mg Oral BID  . DULoxetine  90 mg Oral Daily  . levETIRAcetam  1,500 mg Oral BID  . LORazepam  1 mg Oral BID  . mirtazapine  45 mg Oral QHS  . pentoxifylline  400 mg Oral BID  . polyethylene glycol  17 g Oral Once  . sodium chloride flush  3 mL Intravenous Q12H  . sodium chloride flush  3 mL Intravenous Q12H    Continuous Infusions: . morphine 2 mg/hr (10/17/16 0056)    PRN Meds: sodium chloride, sodium chloride, acetaminophen **OR** acetaminophen, antiseptic oral rinse, bisacodyl, glycopyrrolate **OR** glycopyrrolate **OR** glycopyrrolate, haloperidol **OR** haloperidol **OR** haloperidol lactate, LORazepam **OR** LORazepam **OR** LORazepam, morphine, ondansetron **OR** ondansetron (ZOFRAN) IV, polyvinyl alcohol, sodium chloride flush, sodium chloride flush  Physical Exam        Well developed male, non responsive, appears comfortable, sleeping Resp agonal breathing, no w/c/r - on N/C 5 liters CV difficult to hear, regular Abdomen soft, NT, ND Condom cath in place - no urine output over night.  Vital Signs: BP (!) 128/103 (BP Location: Left Arm)   Pulse (!) 113   Temp 98 F  (36.7 C) (Oral)   Resp 14   Ht 6' (1.829 m)   Wt 136.1 kg (300 lb)   SpO2 93%   BMI 40.69 kg/m  SpO2: SpO2: 93 % O2 Device: O2 Device: High Flow Nasal Cannula O2 Flow Rate: O2 Flow Rate (L/min): 10 L/min  Intake/output summary:  Intake/Output Summary (Last 24 hours) at 10/17/16 0731 Last data filed at 10/17/16 0500  Gross per 24 hour  Intake           384.84 ml  Output  400 ml  Net           -15.16 ml   LBM: Last BM Date:  (6 days ago.) Baseline Weight: Weight: 136.1 kg (300 lb) Most recent weight: Weight: 136.1 kg (300 lb)       Palliative Assessment/Data:    Flowsheet Rows   Flowsheet Row Most Recent Value  Intake Tab  Referral Department  Hospitalist  Unit at Time of Referral  Oncology Unit  Palliative Care Primary Diagnosis  Pulmonary  Date Notified  09/29/2016  Palliative Care Type  Return patient Palliative Care  Reason for referral  Clarify Goals of Care, Non-pain Symptom  Date of Admission  10/20/2016  Date first seen by Palliative Care  10/16/16  # of days Palliative referral response time  1 Day(s)  # of days IP prior to Palliative referral  0  Clinical Assessment  Palliative Performance Scale Score  20%  Dyspnea Max Last 24 Hours  7  Dyspnea Min Last 24 hours  4  Psychosocial & Spiritual Assessment  Palliative Care Outcomes  Patient/Family meeting held?  Yes  Who was at the meeting?  patient, wife, brother, son multiple family members and friend.  Palliative Care Outcomes  Clarified goals of care      Patient Active Problem List   Diagnosis Date Noted  . Dyspnea   . S/P thoracentesis   . Respiratory failure with hypoxia ([place]) 10/07/2016  . Pleural effusion on left 10/22/2016  . Malignant neoplasm of lung ([place])   . Palliative care by specialist   . DNR (do not resuscitate)   . Morbid obesity ([place]) 06/04/2016  . Personal history of venous thrombosis and embolism 03/06/2016  . Anemia in neoplastic disease 12/16/2015  . Depression  07/31/2015  . Metastatic lung cancer (metastasis from lung to other site) ([place]) 03/29/2015  . Brain metastasis ([place]) 03/29/2015  . Liver masses   . Left frontal lobe mass 03/24/2015  . HTN (hypertension) 03/24/2015  . Diabetes mellitus type 2 in obese ([place]) 03/24/2015  . History of gastric bypass 03/24/2015  . Chronic diastolic heart failure ([place]) 03/24/2015  . Aspiration pneumonitis ([place]) 03/24/2015  . Multiple rib fractures 03/24/2015  . Cardiac arrest ([place])   . Convulsion ([place])   . Syncope and collapse 03/23/2015    Palliative Care Assessment & Plan   Patient Profile: 60 y.o. male  with past medical history of DM, gastric bypass, and metastatic lung cancer including mets to the brain, liver and bone, who was admitted from the oncology office on 10/13/2016 with dyspnea. He was found to have a new left pleural effusion and possible pneumonia.  The patient is unable to speak with me as he is aspirating and appears extremely fatigued.  He had radiation treatments this morning.  He has work of breathing.  His wife admits he is struggling.  A thoracentesis is scheduled which will hopefully bring him relief of his dyspnea.  Thoracentesis did not resolve is difficulty breathing.  The patient continued to decline on 10/25 PM and his wife decided to pursue comfort measures.  Assessment: Patient is actively dying.  Agonal breathing, non-responsive, minimal (if any) urine output.  He has weaned from high flow N/C to  5L of oxygen.  I mentioned to Ivin Booty that the oxygen is life prolonging.  She would like to leave it in place for now.    Recommendations/Plan:  Continue comfort measures.  Wife is very emotional and in need of support. Will ask Chaplain to continue  to visit.  Goals of Care and Additional Recommendations:  Limitations on Scope of Treatment: Full Comfort Care  Code Status:  DNR  Prognosis:  Hours to days.   Will likely be less than 24 hours.  Discharge  Planning:  Anticipated Hospital Death  Care plan was discussed with bedside RN and patient's family.  Thank you for allowing the Palliative Medicine Team to assist in the care of this patient.   Time In: 7:15 Time Out: 7:40 Total Time 25 min Prolonged Time Billed no      Greater than 50%  of this time was spent counseling and coordinating care related to the above assessment and plan.  Imogene Burn, PA-C Palliative Medicine   Please contact Palliative MedicineTeam phone at [phone] for questions and concerns.   Please see AMION for individual provider pager numbers.

## 2016-10-17 NOTE — Progress Notes (Signed)
Looks comfortable Now on morphine gtt Family support provided.  We will s/o.Marland Kitchen  Erick Colace ACNP-BC [place] Pager # [phone] OR # [phone] if no answer

## 2016-10-17 NOTE — Progress Notes (Signed)
CSW consulted to assist with residential hospice home placement, if appropriate. PN reviewed. Palliative Care Team following. Notes indicate that a hospital death is expected. CSW will remain available to assist with placement if status changes and residential placement is needed.  Werner Lean LCSW [phone]

## 2016-10-17 NOTE — Progress Notes (Signed)
Morphine 2 mgs given as iv bolus then oxygen was discontinued as per palliative care order. Family was made aware.Morphine drip was increased to 4 mgs/hr for comfort. At 1500 ,Matt the chaplain said the patient's wife turned the morphine pump off.Family at bedside.

## 2016-10-17 NOTE — Progress Notes (Signed)
PROGRESS NOTE    Walter Wilkins.  AJO:[id number] DOB: 15-Feb-1956 DOA: 10/14/2016 PCP: Yong Channel, MD    Brief Narrative:  60 y.o. 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 Found to have mod L pleural effusion and possible lymphangitic spread of tumor vs fluid overlaod on CT on 10/20. Pulse ox 74% on room air in office.   Assessment & Plan:   Principal Problem:   Respiratory failure with hypoxia ([place]) Active Problems:   HTN (hypertension)   Diabetes mellitus type 2 in obese ([place])   History of gastric bypass   Chronic diastolic heart failure ([facility])   Metastatic lung cancer (metastasis from lung to other site) ([place])   Brain metastasis ([facility])   Anemia in neoplastic disease   Personal history of venous thrombosis and embolism   Pleural effusion on left   Dyspnea   S/P thoracentesis   Encounter for dying care   Palliative care encounter    Acute Respiratory failure with hypoxia  -Respiratory failure continues to worsen this hospital admission -Patient noted to be quite hypoxic this morning. Appreciate input by pulmonary critical care -Patient is now status post bedside thoracentesis, yielding 1.5 L of fluid -Despite large volume thoracentesis, patient remained short of breath -Palliative care consulted, appreciate input -On the afternoon of 10/16/2016, patient's condition continued to deteriorate rapidly. -Patient currently on full comfort measures on morphine drip    Chronic diastolic heart failure grade 1 , murmur RUS border - 2-D echocardiogram with moderate concentric hypertrophy and normal LV EF    Metastatic lung cancer (metastasis from lung to other site)  -Overall prognosis at this time is grim.    -Had earlier discussed with oncology who stated patient would likely not benefit from continued chemotherapy. 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 -Continued on Keppra and Tegretol per home regimen    Diabetes mellitus type 2 in obese  -Comfort care measures now -Comfort feeds on an as-tolerated basis  Severe constipation -Cathartics to be given on as-needed basis  Weight loss/ anorexia -Full comfort measures at this time   DVT prophylaxis: Comfort care Code Status: DO NOT RESUSCITATE, comfort measures only Family Communication: Patient in room, family at bedside Disposition Plan: Anticipate hospital death  Consultants:   Oncology  Palliative care  Pulmonary  Procedures:   Thoracentesis 10/16/2016  Antimicrobials: Anti-infectives    None      Subjective: Remains lethargic  Objective: Vitals:   10/16/16 0435 10/16/16 0602 10/16/16 0640 10/16/16 1438  BP:   (!) 126/99 (!) 128/103  Pulse: 97 (!) 102 (!) 102 (!) 113  Resp:   12 14  Temp:   98.2 F (36.8 C) 98 F (36.7 C)  TempSrc:  Axillary Oral  SpO2: 94% 93% 96% 93%  Weight:      Height:        Intake/Output Summary (Last 24 hours) at 10/17/16 1824 Last data filed at 10/17/16 0500  Gross per 24 hour  Intake           132.37 ml  Output                0 ml  Net           132.37 ml   Filed Weights   10/11/2016 1016  Weight: 136.1 kg (300  lb)    Examination:  General exam:Lethargic, remained short of breath Respiratory system: No audible wheezing, appears of apnea noted Cardiovascular system: Tachycardic, S1-S2 Gastrointestinal system: Nondistended, positive bowel sounds, Central nervous system: Lethargic, arousable, no seizures Extremities: Perfused, no clubbing, no cyanosis at present Skin: Normal skin turgor, no notable skin lesions seen Psychiatry: When awake, mood appears normal, no visual hallucinations   Data Reviewed: I have personally reviewed following labs and imaging studies  CBC:  Recent Labs Lab 10/14/2016 0814 10/16/16 0354  WBC 11.3* 13.1*  NEUTROABS 8.0*  --   HGB 15.0 15.7  HCT 45.5 46.5  MCV 93.8 92.8  PLT 207 536   Basic Metabolic Panel:  Recent Labs Lab 10/21/2016 0814 10/16/16 0354  NA 137 135  K 4.2 4.6  CL  --  97*  CO2 25 27  GLUCOSE 164* 182*  BUN 14.3 19  CREATININE 0.9 1.32*  CALCIUM 9.1 8.4*   GFR: Estimated Creatinine Clearance: 84 mL/min (by C-G formula based on SCr of 1.32 mg/dL (H)). Liver Function Tests:  Recent Labs Lab 10/16/2016 0814 10/16/16 1245  AST 12  --   ALT 8  --   ALKPHOS 132  --   BILITOT 0.39  --   PROT 6.8 6.7  ALBUMIN 3.2*  --    No results for input(s): LIPASE, AMYLASE in the last 168 hours. No results for input(s): AMMONIA in the last 168 hours. Coagulation Profile:  Recent Labs Lab 09/30/2016 1125  INR 0.94   Cardiac Enzymes: No results for input(s): CKTOTAL, CKMB, CKMBINDEX, TROPONINI in the last 168 hours. BNP (last 3 results) No results for input(s): PROBNP in the last 8760 hours. HbA1C: No results for input(s): HGBA1C in the last 72 hours. CBG:  Recent Labs Lab 09/24/2016 1249 10/02/2016 1745 10/01/2016 2110 10/16/16 0757 10/16/16 1234  GLUCAP 150* 192* 221* 186* 204*   Lipid Profile:  Recent Labs  10/16/16 1245  CHOL 202*   Thyroid Function Tests: No results for input(s): TSH, T4TOTAL, FREET4, T3FREE, THYROIDAB in  the last 72 hours. Anemia Panel:  Recent Labs  10/14/2016 0814  RETICCTPCT 1.89*   Sepsis Labs: No results for input(s): PROCALCITON, LATICACIDVEN in the last 168 hours.  Recent Results (from the past 240 hour(s))  Body fluid culture     Status: None (Preliminary result)   Collection Time: 10/16/16 12:22 PM  Result Value Ref Range Status   Specimen Description PLEURAL  Final   Special Requests Normal  Final   Gram Stain   Final    RARE WBC PRESENT, PREDOMINANTLY PMN NO ORGANISMS SEEN    Culture   Final    NO GROWTH < 24 HOURS Performed at [facility]    Report Status PENDING  Incomplete     Radiology Studies: Dg Chest Port 1 View  Result Date: 10/16/2016 CLINICAL DATA:  Post thoracentesis EXAM: PORTABLE CHEST 1  VIEW COMPARISON:  10/07/2016 FINDINGS: There is decreased in partially loculated left pleural effusion. Cardiomegaly again noted. No pulmonary edema. Persistent left lower lobe consolidation. No pneumothorax. IMPRESSION: Decreased partial loculated left pleural effusion. Persistent left lower lobe consolidation. No pneumothorax. Electronically Signed   By: Lahoma Crocker M.D.   On: 10/16/2016 13:09    Scheduled Meds: . bisacodyl  10 mg Rectal Once  . LORazepam  1 mg Oral BID  . mirtazapine  45 mg Oral QHS  . polyethylene glycol  17 g Oral Once  . sodium chloride flush  3 mL Intravenous Q12H  . sodium chloride flush  3 mL Intravenous Q12H   Continuous Infusions: . morphine 4 mg/hr (10/17/16 1412)     LOS: 2 days   Walter Wilkins, Walter Melter, MD [facility] Pager [phone]  If 7PM-7AM, please contact night-coverage [email or web address] Password [facility] 10/17/2016, 6:24 PM

## 2016-10-17 NOTE — Progress Notes (Signed)
Pt's wife, son and son's SO were bedside when I arrived. Pt's son and his SO left. Pt's wife was tearful tonight as she spoke of how she wishes she could get 6 more months, but she said she knows it's time. She admitted at times she doesn't want to believe it - they've been together over 40 yrs. Pt's wife also spoke of her son-in-law's passing just 4 mths ago. She said her daughter and grandchildren are gone [place]. To add to their grieving, Sharon's deceased son-in-law's bday is tomorrow. She tearfully asked for continued prayer and said she was going to turn down the lights and take a nap while she had time alone w/her husband. [facility] offered add'tl spt if she needs it. Pls page anytime spt is needed. Chaplain Ernest Haber, M.Div.   10/17/16 1900  Clinical Encounter Type  Visited With Patient and family together

## 2016-10-17 NOTE — Progress Notes (Signed)
Per PMT note pt has agonal breathing, non-responsive, minimal (if any) urine output. Anticipating hospital death. Pt is not GIP appropriate due to [facility] insurance. CM will continue to follow along. Marney Doctor RN,BSN,NCM [phone]

## 2016-10-17 NOTE — Progress Notes (Addendum)
Follow up with family for continued support around loss.  Family reports pt appears comfortable.  They are grieving appropriately at bedside.  Shared prayers with family as pt declines.   [place], [place]

## 2016-10-17 NOTE — Progress Notes (Signed)
Nutrition Brief Note  Pt identified as at nutrition risk on the Malnutrition Screen Tool.  Chart reviewed. Pt now transitioning to comfort care.  No nutrition interventions warranted at this time.  Please consult as needed.   Clayton Bibles, MS, RD, LDN Pager: [phone] After Hours Pager: [phone]

## 2016-10-18 ENCOUNTER — Ambulatory Visit: Payer: Commercial Managed Care - PPO

## 2016-10-18 LAB — RHEUMATOID FACTORS, FLUID: Rheumatoid Arthritis, Qn/Fluid: NEGATIVE

## 2016-10-20 LAB — BODY FLUID CULTURE
CULTURE: NO GROWTH
Special Requests: NORMAL

## 2016-10-21 LAB — CHOLESTEROL, BODY FLUID: Cholesterol, Fluid: 96 mg/dL

## 2016-10-23 LAB — ADENOSIDE DEAMINASE, PLEURAL FL: ADENOSIDE DEAMINASE, PLEURAL FL: 2.9 U/L (ref 0.0–9.4)

## 2016-10-23 NOTE — Progress Notes (Signed)
Nursing Note: Wasted IV morphine left from IV drip .There was 170 ml left form a 250 ml bag/250 mg in 250 ml of .9 NS and was wasted in the sink and was witnessed by Cameroon Cherry,Rn.wbb

## 2016-10-23 NOTE — Progress Notes (Signed)
Body being transported to [email or web address]

## 2016-10-23 NOTE — Progress Notes (Signed)
Nursing Note: Post -mortem care completed per policy with dignity and respect.wbb

## 2016-10-23 NOTE — Discharge Summary (Signed)
Death Summary  Walter Wilkins. FYB:[id number] DOB: [birth year] DOA: 18-Oct-2016  PCP: Yong Channel, MD  Admit date: 10/21/2016 Date of Death: 10/21/16 Time of Death: 0231 Notification: CABEZA,YURI, MD notified of death of 10/21/16   History of present illness:  See admit h and p. 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 Found to have mod L pleural effusion and possible lymphangitic spread of tumor vs fluid overlaod on CT on 10/20. Pulse ox 74% on room air in office.  Final Diagnoses:  AcuteRespiratory failure with hypoxia  -Respiratory failure continued to worsen this hospital admission -Pulmonary was consulted and patient underwent bedside thoracentesis, yielding 1.5 L of fluid -Despite large volume thoracentesis, patient remained short of breath -Palliative care was consulted -On the afternoon of 10/16/2016, patient's condition continued to deteriorate rapidly. -Patient was placed on full comfort measures on morphine drip per family wishes -At [place] on 2016-10-21, patient was pronounced.  Chronic diastolic heart failure grade 1 , murmur RUS border - 2-D echocardiogram with moderate concentric hypertrophy and normal LV EF  Metastatic lung cancer (metastasis from lung to other site)  -Had earlier discussed with oncology who stated patient would likely not benefit from continued chemotherapy. 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    Significant Diagnostic Studies: Ct Chest W Contrast  Result Date: 10/11/2016 CLINICAL DATA:  Followup lung cancer EXAM: CT CHEST WITH CONTRAST TECHNIQUE: Multidetector CT imaging of the chest was performed during intravenous contrast administration. CONTRAST:  55m ISOVUE-300 IOPAMIDOL (ISOVUE-300) INJECTION 61% COMPARISON:  09/16/2016 FINDINGS: Cardiovascular: No significant vascular findings. Normal heart size. No pericardial effusion. Aortic atherosclerosis noted. Mediastinum/Nodes: The trachea appears patent and is midline. Normal appearance of the esophagus. Stable right hilar node measuring 2 cm, image 71 of series 2. Left paratracheal node measures 7 mm, image 33 of series 2. This is unchanged from previous exam. Lungs/Pleura: There is a moderate volume left pleural effusion which is increased from previous exam. Progressive airspace consolidation involving the basilar portions of the left upper lobe and left lower lobe. There is also diffuse interstitial thickening which appears lower lobe predominant and progressive when compared with the previous exam. Additionally, there is new interlobular septal thickening which appears lower lobe predominant. Upper Abdomen: No acute abnormality. Musculoskeletal: Similar appearance of widespread sclerotic bone metastases. IMPRESSION: 1. Progressive airspace consolidation within the left upper lobe and left lower lobe. There is also a new moderate volume  left pleural effusion. As described previously differential considerations include inflammatory or infectious etiologies (drug reaction not excluded) as well as recurrent progressive tumor involvement of the  lung. 2. There is new interlobular septal thickening which appears lower lobe predominant. This is a nonspecific finding and may be seen with lymphangitic spread of tumor as well as the fluid overload state. Careful clinical correlation is advised. 3. Similar appearance of sclerotic bone metastases. Electronically Signed   By: Kerby Moors M.D.   On: 10/11/2016 08:45   Mr Jeri Cos XV Contrast  Result Date: 09/24/2016 CLINICAL DATA:  History of lung cancer with brain metastases. Prior stereotactic radiosurgery. Follow-up. EXAM: MRI HEAD WITHOUT AND WITH CONTRAST TECHNIQUE: Multiplanar, multiecho pulse sequences of the brain and surrounding structures were obtained without and with intravenous contrast. CONTRAST:  68m MULTIHANCE GADOBENATE DIMEGLUMINE 529 MG/ML IV SOLN COMPARISON:  06/04/2016 FINDINGS: Brain: There is no evidence of acute infarct, midline shift, or extra-axial fluid collection. Ventricles and sulci are within normal limits for age. Small scattered foci of T2 hyperintensity in the cerebral white matter bilaterally are unchanged and nonspecific but compatible with mild chronic small vessel ischemic disease. The enhancing the peripherally enhancing lesion in the posterior left frontal lobe has mildly decreased in size, now measuring 26 x 14 x 17 mm (previously 26 x 17 x 23 mm). Surrounding edema has mildly improved. Chronic blood products are again seen. There are at least 21 new enhancing supratentorial and infratentorial brain lesions, all subcentimeter in size (annotated on series 10). The largest cerebellar lesion measures 3 mm in the left hemisphere (series 10, image 11). The largest cerebral lesion measures 5 mm in the right occipital lobe (series 10, image 46). There is no edema  associated with any of these lesions. Vascular: Major intracranial vascular flow voids are preserved. Skull and upper cervical spine: Osseous metastasis in the clivus is unchanged. Sinuses/Orbits: Unremarkable orbits. No significant inflammatory disease in the paranasal sinuses are mastoid air cells. Other: None. IMPRESSION: 1. At least 21 new subcentimeter brain metastases.  No edema. 2. Slightly decreased size of left frontal lobe lesion with mildly decreased surrounding edema. Electronically Signed   By: ALogan BoresM.D.   On: 09/24/2016 13:13   Dg Chest Port 1 View  Result Date: 10/16/2016 CLINICAL DATA:  Post thoracentesis EXAM: PORTABLE CHEST 1 VIEW COMPARISON:  10/13/2016 FINDINGS: There is decreased in partially loculated left pleural effusion. Cardiomegaly again noted. No pulmonary edema. Persistent left lower lobe consolidation. No pneumothorax. IMPRESSION: Decreased partial loculated left pleural effusion. Persistent left lower lobe consolidation. No pneumothorax. Electronically Signed   By: LLahoma CrockerM.D.   On: 10/16/2016 13:09   Dg Chest Port 1 View  Result Date: 10/01/2016 CLINICAL DATA:  Hypoxia.  Lung cancer with metastatic disease. EXAM: PORTABLE CHEST 1 VIEW COMPARISON:  CT chest 10/11/2016 FINDINGS: Moderately large left effusion has progressed. Left lower lobe collapse. Left upper lobe atelectasis. Left lower lobe mass lesion difficult to see on the current study. Pulmonary vascular congestion. Prominent interstitial lung markings on the right may reflect lymphangitic spread of tumor versus interstitial edema. No effusion on the right. IMPRESSION: Enlarging left effusion. Left upper lobe left lower lobe atelectasis. Left lower lobe mass lesion is noted on prior studies Prominent interstitial markings in the right may represent lymphangitic spread of tumor versus interstitial edema. Electronically Signed   By: CFranchot GalloM.D.   On: 09/24/2016 14:59    Microbiology: Recent  Results (from the past 240 hour(s))  Body fluid culture     Status: None (Preliminary result)   Collection Time: 10/16/16 12:22 PM  Result Value Ref Range Status  Specimen Description PLEURAL  Final   Special Requests Normal  Final   Gram Stain   Final    RARE WBC PRESENT, PREDOMINANTLY PMN NO ORGANISMS SEEN    Culture   Final    NO GROWTH 2 DAYS Performed at [facility]    Report Status PENDING  Incomplete     Labs: Basic Metabolic Panel:  Recent Labs Lab 10/09/2016 0814 10/16/16 0354  NA 137 135  K 4.2 4.6  CL  --  97*  CO2 25 27  GLUCOSE 164* 182*  BUN 14.3 19  CREATININE 0.9 1.32*  CALCIUM 9.1 8.4*   Liver Function Tests:  Recent Labs Lab 10/13/2016 0814 10/16/16 1245  AST 12  --   ALT 8  --   ALKPHOS 132  --   BILITOT 0.39  --   PROT 6.8 6.7  ALBUMIN 3.2*  --    No results for input(s): LIPASE, AMYLASE in the last 168 hours. No results for input(s): AMMONIA in the last 168 hours. CBC:  Recent Labs Lab 10/20/2016 0814 10/16/16 0354  WBC 11.3* 13.1*  NEUTROABS 8.0*  --   HGB 15.0 15.7  HCT 45.5 46.5  MCV 93.8 92.8  PLT 207 237   Cardiac Enzymes: No results for input(s): CKTOTAL, CKMB, CKMBINDEX, TROPONINI in the last 168 hours. D-Dimer No results for input(s): DDIMER in the last 72 hours. BNP: Invalid input(s): POCBNP CBG:  Recent Labs Lab 10/16/2016 1249 10/19/2016 1745 10/22/2016 2110 10/16/16 0757 10/16/16 1234  GLUCAP 150* 192* 221* 186* 204*   Anemia work up No results for input(s): VITAMINB12, FOLATE, FERRITIN, TIBC, IRON, RETICCTPCT in the last 72 hours. Urinalysis No results found for: COLORURINE, APPEARANCEUR, LABSPEC, PHURINE, GLUCOSEU, HGBUR, BILIRUBINUR, KETONESUR, PROTEINUR, UROBILINOGEN, NITRITE, LEUKOCYTESUR Sepsis Labs Invalid input(s): PROCALCITONIN,  WBC,  LACTICIDVEN    SIGNED:  Zyla Dascenzo, Orpah Melter, MD  [facility] 2016/10/29, 7:41 PM  If 7PM-7AM, please contact night-coverage [email or web address] Password  [email or web address]

## 2016-10-23 NOTE — Progress Notes (Signed)
Nursing Note: Pt has expired.At [place] verified death.wbb

## 2016-10-23 NOTE — Progress Notes (Signed)
Nursing Note: Spoke with Josh at [facility]ref number [id number].Waited for call form eye care.At [place] talked with Bethena Roys and infomed her that wife has refused for eye donation.wbbref number [id number] [id number].[phone]

## 2016-10-23 NOTE — Progress Notes (Signed)
This nurse verified death of death for Walter Wilkins. at [phone] with primary nurse Berdine Addison.   This nurse, also witnessed a waste of  IV morphine left from IV drip with Berdine Addison .There was 170 ml left form a 250 ml bag/250 mg in 250 ml of .9 NS and was wasted in the sink.    -BOC,RN

## 2016-10-23 DEATH — deceased

## 2016-10-29 ENCOUNTER — Encounter (HOSPITAL_COMMUNITY): Payer: Self-pay

## 2016-11-18 NOTE — Progress Notes (Signed)
  Radiation Oncology         [phone]) [phone] ________________________________  Name: Walter Wilkins. MRN: [id number]  Date: 10/16/2016  DOB: 1956-04-05  End of Treatment Note  Diagnosis:  Brain metastases  Indication for treatment:  palliative       Radiation treatment dates:   09-30-16 to 10-16-16  Site/dose:  .Whole brain / 35 Gy in 14 fractions planned (only completed 12 fractions to 30 Gy)  Beams/energy:   Opposed beams / 6X  Narrative: The patient tolerated radiation treatment relatively well but developed rapid progression of his lung disease.  Before completing radiotherapy he was admitted to the hospital and his family decided to pursue comfort measures only. Radiotherapy was stopped.  Plan:  To see Dr. Isidore Moos PRN -----------------------------------  Eppie Gibson, MD

## 2016-11-22 ENCOUNTER — Ambulatory Visit: Payer: Self-pay | Admitting: Radiation Oncology

## 2016-12-07 ENCOUNTER — Other Ambulatory Visit: Payer: Self-pay | Admitting: Nurse Practitioner

## 2016-12-09 ENCOUNTER — Ambulatory Visit: Payer: Commercial Managed Care - PPO

## 2016-12-09 ENCOUNTER — Other Ambulatory Visit: Payer: Commercial Managed Care - PPO

## 2017-03-10 ENCOUNTER — Other Ambulatory Visit: Payer: Commercial Managed Care - PPO

## 2017-03-10 ENCOUNTER — Ambulatory Visit: Payer: Commercial Managed Care - PPO

## 2017-05-17 IMAGING — MR MR HEAD WO/W CM
9 of 11 series · 28 of 48 positions shown · IV contrast (YES   GAD)
Comparison: 03/30/2015 brain MRI

CLINICAL DATA: Metastatic lung cancer. SRS to left frontal brain
metastasis 04/10/2015.

EXAM:
MRI HEAD WITHOUT AND WITH CONTRAST
TECHNIQUE: Multiplanar, multiecho pulse sequences of the brain and surrounding
structures were obtained without and with intravenous contrast.
CONTRAST:  20mL MULTIHANCE GADOBENATE DIMEGLUMINE 529 MG/ML IV SOLN

[Series 3: FLAIR · sagittal · 3.0mm · 0.47mm/px · 2 of 42 slices shown (1 of 3)]
[im 1/42]
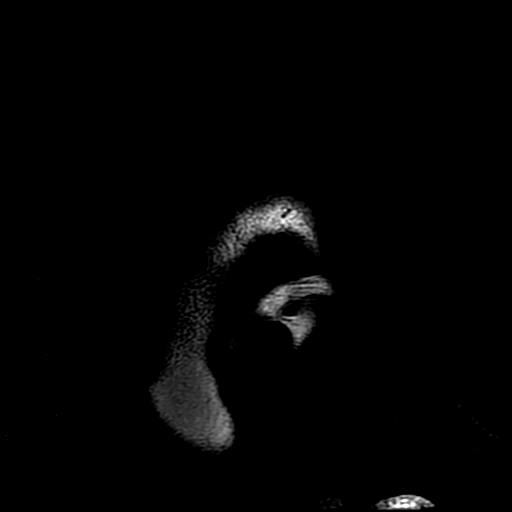
[im 42/42]
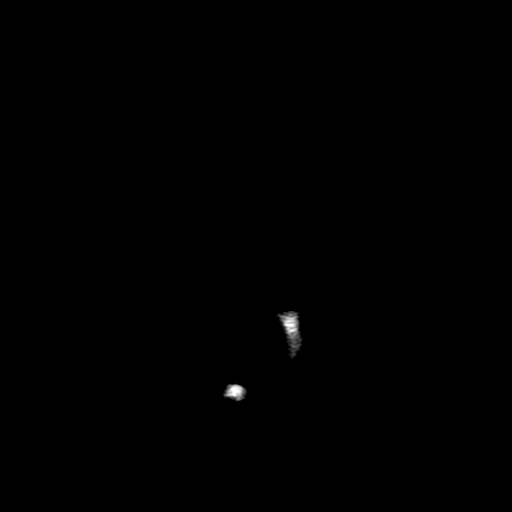

[Series 4: DWI · axial · 3.0mm · 1.09mm/px · z∈[-61,+96]mm · 6 of 110 slices shown (1 of 2)]
[im 1/110]
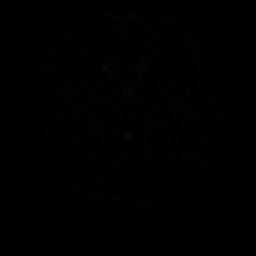
[im 22/110]
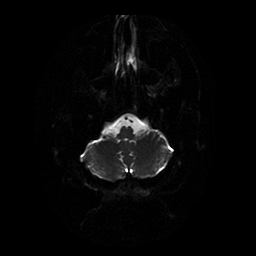
[im 44/110]
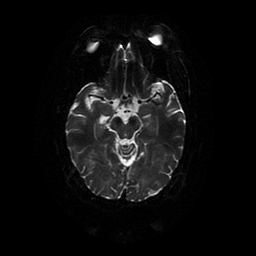
[im 66/110]
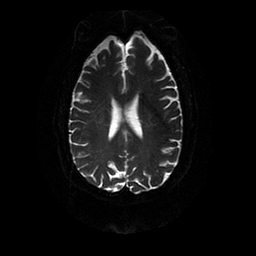
[im 88/110]
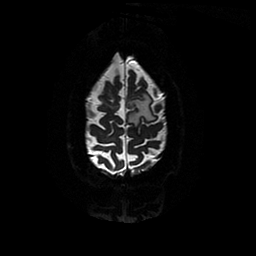
[im 110/110]
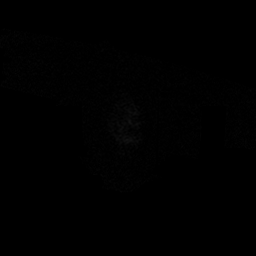

[Series 5: T2-star · axial · 5.0mm · 0.43mm/px · z∈[-65,+93]mm · 2 of 28 slices shown]
[im 1/28]
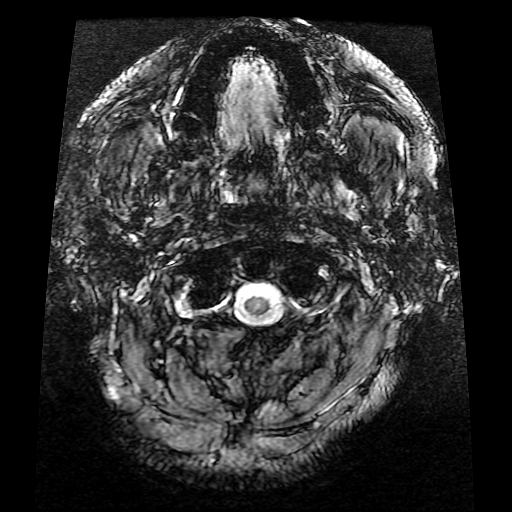
[im 28/28]
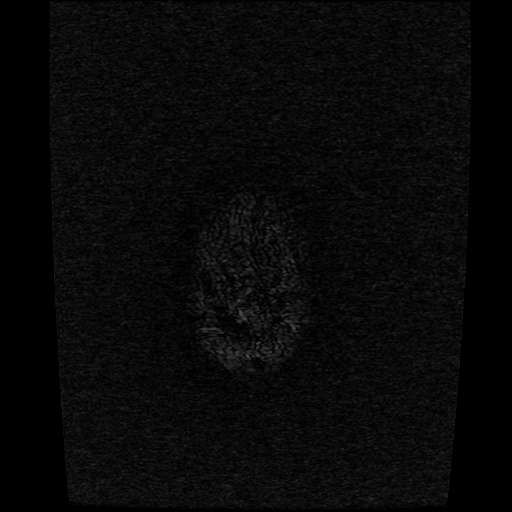

[Series 6: (id) · axial · 5.0mm · 0.43mm/px · z∈[-65,+93]mm · 2 of 28 slices shown]
[im 1/28]
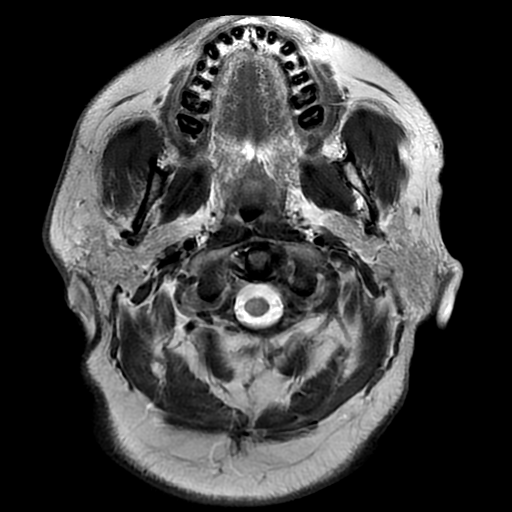
[im 28/28]
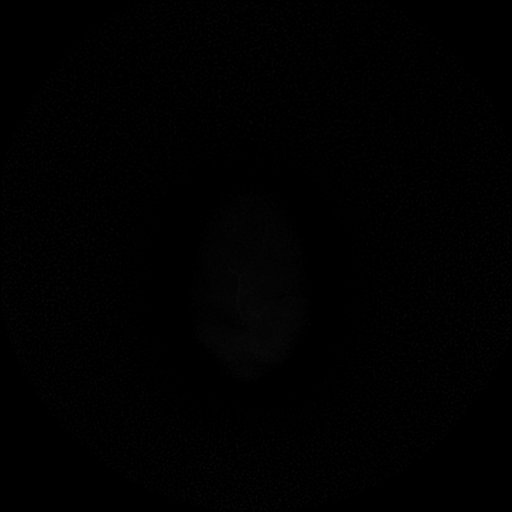

[Series 7: FLAIR · axial · 1.0mm · 0.86mm/px · z∈[-66,+92]mm · 5 of 82 slices shown (2 of 3)]
[im 1/82]
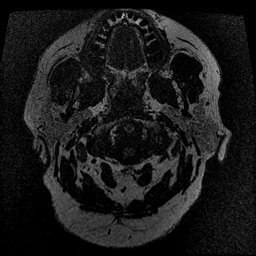
[im 21/82]
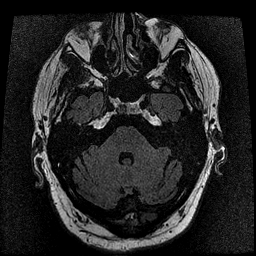
[im 41/82]
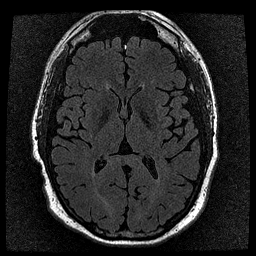
[im 61/82]
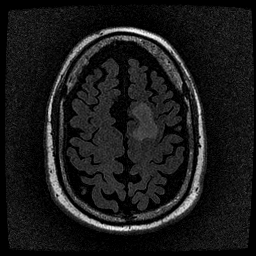
[im 82/82]
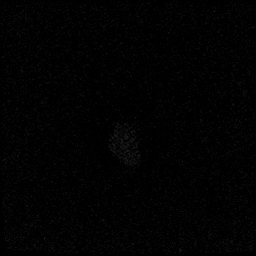

[Series 9: T2 post-contrast · coronal · 3.0mm · 0.45mm/px · 3 of 48 slices shown]
[im 1/48]
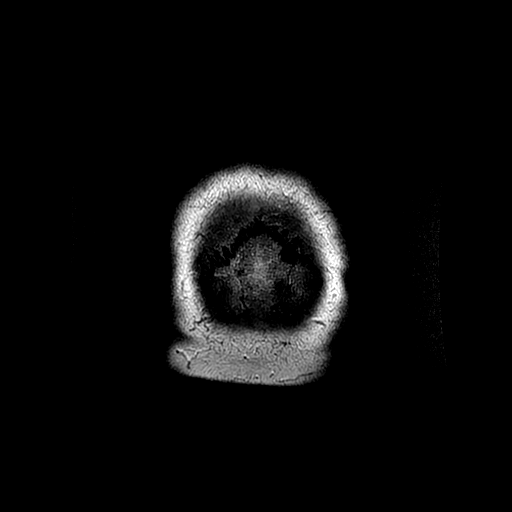
[im 24/48]
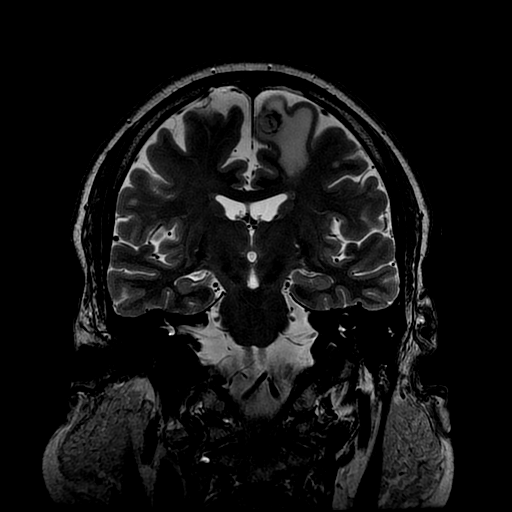
[im 48/48]
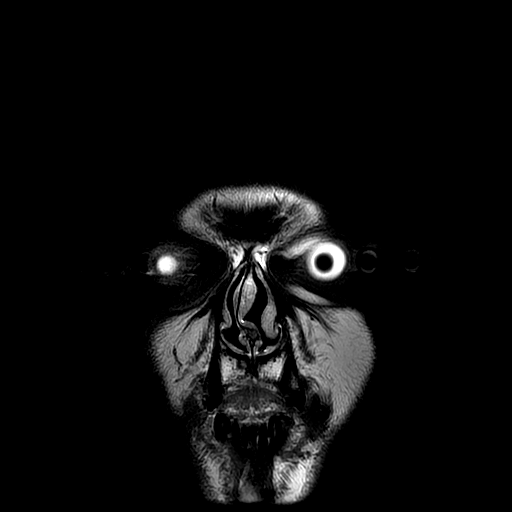

[Series 11: T1 · coronal · 3.0mm · 0.45mm/px · 3 of 48 slices shown]
[im 1/48]
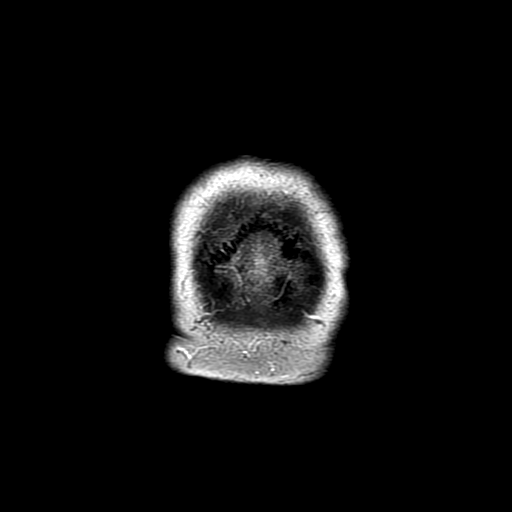
[im 24/48]
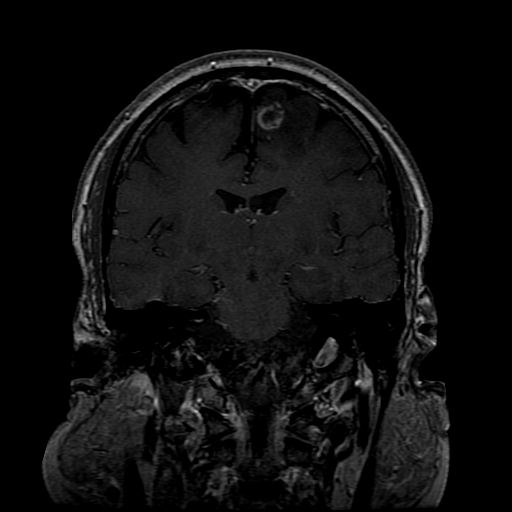
[im 48/48]
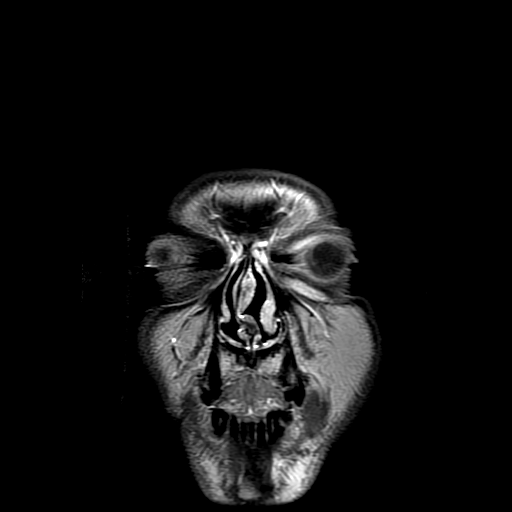

[Series 12: FLAIR · sagittal · 3.0mm · 0.47mm/px · 2 of 42 slices shown (3 of 3)]
[im 1/42]
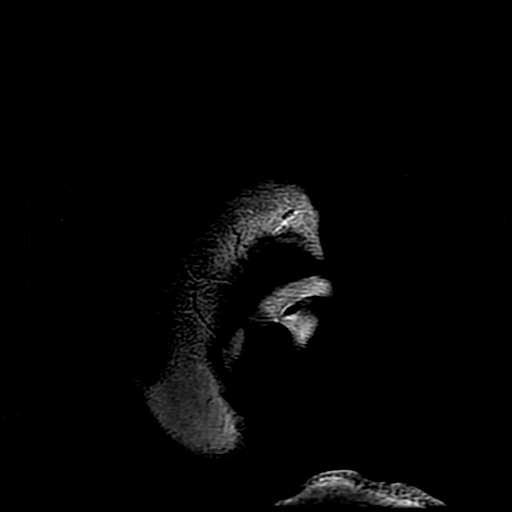
[im 42/42]
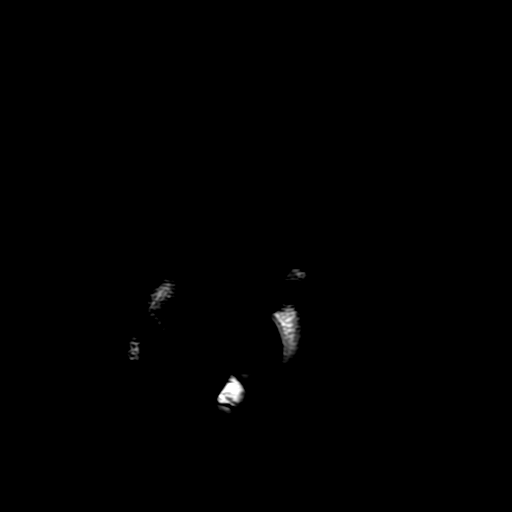

[Series 400: DWI · axial · 3.0mm · 1.09mm/px · z∈[-61,+96]mm · 3 of 55 slices shown (2 of 2)]
[im 1/55]
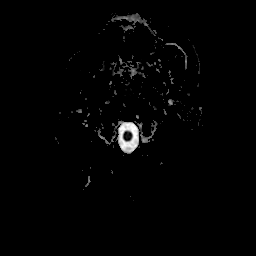
[im 28/55]
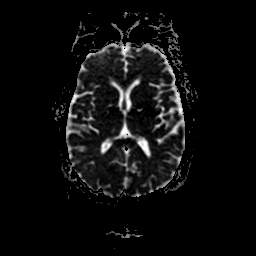
[im 55/55]
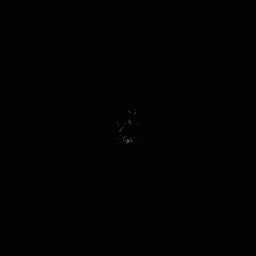

[28 of 48 positions shown; findings below may reference images not displayed]

FINDINGS: There is no evidence of acute infarct, midline shift, or extra-axial
fluid collection. There is mild generalized cerebral atrophy.
Minimal chronic small vessel ischemic disease is noted in the
cerebral white matter, less than is often seen in patients of this
age.

Left superior frontal gyrus enhancing metastasis has mildly
decreased in size, measuring 1.7 x 1.6 cm (series 10, image 136,
previously 2.4 x 1.9 cm). Associated chronic blood products are
again noted. T2 hyperintensity/ edema in the surrounding left
frontal white matter has slightly increased. No new enhancing brain
lesions are identified.

Orbits are unremarkable. Paranasal sinuses and mastoid air cells are
clear. 1.2 cm lesion in the clivus appear slightly smaller than on
the prior study.
IMPRESSION: 1. Decreased size of left frontal metastasis. Slightly increased
surrounding edema may be due to radiation therapy.
2. No evidence of new brain metastases.
3. Slightly decreased size of osseous metastasis in the clivus.
